# Patient Record
Sex: Male | Born: 1970 | Race: White | Hispanic: No | Marital: Married | Smoking: Never smoker
Health system: Southern US, Community
[De-identification: ages and names within clinical notes are randomized; demographics above are authoritative.]

## PROBLEM LIST (undated history)

## (undated) DIAGNOSIS — Z85830 Personal history of malignant neoplasm of bone: Secondary | ICD-10-CM

## (undated) DIAGNOSIS — K219 Gastro-esophageal reflux disease without esophagitis: Secondary | ICD-10-CM

## (undated) DIAGNOSIS — E119 Type 2 diabetes mellitus without complications: Secondary | ICD-10-CM

## (undated) DIAGNOSIS — F419 Anxiety disorder, unspecified: Secondary | ICD-10-CM

## (undated) DIAGNOSIS — C801 Malignant (primary) neoplasm, unspecified: Secondary | ICD-10-CM

## (undated) DIAGNOSIS — G4730 Sleep apnea, unspecified: Secondary | ICD-10-CM

## (undated) DIAGNOSIS — Z87442 Personal history of urinary calculi: Secondary | ICD-10-CM

## (undated) HISTORY — PX: KIDNEY STONE SURGERY: SHX686

## (undated) HISTORY — DX: Anxiety disorder, unspecified: F41.9

## (undated) HISTORY — DX: Malignant (primary) neoplasm, unspecified: C80.1

## (undated) HISTORY — DX: Personal history of malignant neoplasm of bone: Z85.830

## (undated) HISTORY — DX: Gastro-esophageal reflux disease without esophagitis: K21.9

## (undated) HISTORY — DX: Sleep apnea, unspecified: G47.30

## (undated) HISTORY — PX: OTHER SURGICAL HISTORY: SHX169

---

## 1997-05-04 DIAGNOSIS — C44701 Unspecified malignant neoplasm of skin of unspecified lower limb, including hip: Secondary | ICD-10-CM | POA: Insufficient documentation

## 1998-09-26 DIAGNOSIS — Z85830 Personal history of malignant neoplasm of bone: Secondary | ICD-10-CM

## 1998-09-26 HISTORY — DX: Personal history of malignant neoplasm of bone: Z85.830

## 2004-07-01 ENCOUNTER — Ambulatory Visit: Payer: Self-pay | Admitting: Urology

## 2012-09-27 ENCOUNTER — Emergency Department: Payer: Self-pay | Admitting: Emergency Medicine

## 2012-09-27 LAB — COMPREHENSIVE METABOLIC PANEL
Albumin: 4.3 g/dL (ref 3.4–5.0)
BUN: 12 mg/dL (ref 7–18)
Bilirubin,Total: 0.5 mg/dL (ref 0.2–1.0)
Calcium, Total: 9.2 mg/dL (ref 8.5–10.1)
EGFR (African American): >60
EGFR (Non-African Amer.): >60
Glucose: 99 mg/dL (ref 65–99)
Osmolality: 279 (ref 275–301)
SGPT (ALT): 57 U/L (ref 12–78)

## 2012-09-27 LAB — CBC
HCT: 46.1 % (ref 40.0–52.0)
MCH: 31.8 pg (ref 26.0–34.0)
MCHC: 35.1 g/dL (ref 32.0–36.0)
MCV: 91 fL (ref 80–100)
Platelet: 216 10*3/uL (ref 150–440)
RDW: 12.9 % (ref 11.5–14.5)
WBC: 6.9 10*3/uL (ref 3.8–10.6)

## 2012-09-27 LAB — URINALYSIS, COMPLETE
Bacteria: NONE SEEN
Bilirubin,UR: NEGATIVE
Ketone: NEGATIVE
Leukocyte Esterase: NEGATIVE
Protein: NEGATIVE
RBC,UR: 1 /HPF (ref 0–5)
Specific Gravity: 1.017 (ref 1.003–1.030)
Squamous Epithelial: NONE SEEN

## 2012-09-27 LAB — LIPASE, BLOOD: Lipase: 164 U/L (ref 73–393)

## 2013-08-05 ENCOUNTER — Emergency Department: Payer: Self-pay | Admitting: Emergency Medicine

## 2013-08-05 LAB — URINALYSIS, COMPLETE
Bacteria: NONE SEEN
Bilirubin,UR: NEGATIVE
Glucose,UR: NEGATIVE mg/dL (ref 0–75)
Leukocyte Esterase: NEGATIVE
Nitrite: NEGATIVE
Ph: 6 (ref 4.5–8.0)
RBC,UR: 136 /HPF (ref 0–5)
Specific Gravity: 1.024 (ref 1.003–1.030)
WBC UR: <1 /HPF (ref 0–5)

## 2013-08-05 LAB — CBC
HCT: 44.3 % (ref 40.0–52.0)
HGB: 15.4 g/dL (ref 13.0–18.0)
MCH: 31.2 pg (ref 26.0–34.0)
MCHC: 34.8 g/dL (ref 32.0–36.0)
Platelet: 246 10*3/uL (ref 150–440)

## 2013-08-05 LAB — BASIC METABOLIC PANEL
Anion Gap: 6 — ABNORMAL LOW (ref 7–16)
BUN: 21 mg/dL — ABNORMAL HIGH (ref 7–18)
Calcium, Total: 9.5 mg/dL (ref 8.5–10.1)
Co2: 27 mmol/L (ref 21–32)
Creatinine: 1.11 mg/dL (ref 0.60–1.30)
EGFR (African American): >60
Osmolality: 286 (ref 275–301)
Potassium: 4.1 mmol/L (ref 3.5–5.1)
Sodium: 142 mmol/L (ref 136–145)

## 2014-03-21 LAB — CBC AND DIFFERENTIAL
HCT: 46 % (ref 41–53)
Hemoglobin: 16.3 g/dL (ref 13.5–17.5)
PLATELETS: 274 10*3/uL (ref 150–399)
WBC: 9.2 10*3/mL

## 2014-03-21 LAB — BASIC METABOLIC PANEL
BUN: 15 mg/dL (ref 4–21)
Creatinine: 1.1 mg/dL (ref 0.6–1.3)
Glucose: 95 mg/dL
Potassium: 4.8 mmol/L (ref 3.4–5.3)
Sodium: 143 mmol/L (ref 137–147)

## 2014-03-21 LAB — HEPATIC FUNCTION PANEL
ALT: 31 U/L (ref 10–40)
AST: 20 U/L (ref 14–40)

## 2014-03-21 LAB — TSH: TSH: 2.29 u[IU]/mL (ref 0.41–5.90)

## 2015-05-08 ENCOUNTER — Encounter: Payer: Self-pay | Admitting: Family Medicine

## 2015-05-08 ENCOUNTER — Ambulatory Visit (INDEPENDENT_AMBULATORY_CARE_PROVIDER_SITE_OTHER): Payer: PRIVATE HEALTH INSURANCE | Admitting: Family Medicine

## 2015-05-08 VITALS — BP 120/90 | HR 93 | Temp 98.8°F | Resp 18 | Wt 253.2 lb

## 2015-05-08 DIAGNOSIS — R059 Cough, unspecified: Secondary | ICD-10-CM

## 2015-05-08 DIAGNOSIS — R05 Cough: Secondary | ICD-10-CM

## 2015-05-08 DIAGNOSIS — R42 Dizziness and giddiness: Secondary | ICD-10-CM | POA: Insufficient documentation

## 2015-05-08 DIAGNOSIS — C499 Malignant neoplasm of connective and soft tissue, unspecified: Secondary | ICD-10-CM | POA: Insufficient documentation

## 2015-05-08 DIAGNOSIS — R002 Palpitations: Secondary | ICD-10-CM | POA: Insufficient documentation

## 2015-05-08 DIAGNOSIS — J069 Acute upper respiratory infection, unspecified: Secondary | ICD-10-CM

## 2015-05-08 MED ORDER — PROMETHAZINE-DM 6.25-15 MG/5ML PO SYRP: 5.0000 mL | ORAL_SOLUTION | Freq: Four times a day (QID) | ORAL | Status: DC | PRN

## 2015-05-08 NOTE — Progress Notes (Signed)
Patient: Elijah Mora Male    DOB: 03-14-1971   44 y.o.   MRN: [id number] Visit Date: 05/08/2015  Today's Provider: Vernie Murders, PA   Chief Complaint  Patient presents with  . Cough  . Head and chest congestion   Subjective:    Cough This is a new problem. The current episode started in the past 7 days (on and off). The problem has been gradually worsening. The problem occurs constantly. The cough is non-productive. Associated symptoms include ear congestion, headaches (sometimes), nasal congestion, postnasal drip, rhinorrhea and wheezing (a little). Pertinent negatives include no chest pain, chills, fever, hemoptysis, sore throat, shortness of breath, sweats or weight loss. Nothing aggravates the symptoms. He has tried nothing for the symptoms. His past medical history is significant for environmental allergies.  Patient states one of his co-workers had the same cough two weeks ago and still has the symptoms.  History reviewed. No pertinent past medical history. Patient Active Problem List   Diagnosis Date Noted  . 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 Dizziness 05/08/2015  . 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 No pertinent past surgical history. Family History  Problem Relation Age of Onset  . Cancer Mother 73    Breast  . Diabetes Father   . Hypertension Father      No Known Allergies   Previous Medications   No medications on file   Review of Systems  Constitutional: Positive for fatigue (tired). Negative for fever, chills, weight loss and diaphoresis.  HENT: Positive for congestion (chest and head), postnasal drip, rhinorrhea and sinus pressure. Negative for nosebleeds, sore throat, tinnitus and voice change.   Eyes: Positive for itching.       Watery eyes several times  Respiratory: Positive for cough and wheezing (a little). Negative for hemoptysis and shortness of breath.   Cardiovascular: Positive for palpitations. Negative for chest  pain.  Gastrointestinal: Negative.   Endocrine: Negative.   Genitourinary: Negative.   Musculoskeletal: Negative.   Skin: Negative.   Allergic/Immunologic: Positive for environmental allergies.  Neurological: Positive for dizziness (a little), light-headedness and headaches (sometimes). Negative for syncope and weakness.  Hematological: Negative.   Psychiatric/Behavioral: Negative.    Social History  Substance Use Topics  . Smoking status: Never Smoker   . Smokeless tobacco: Never Used  . Alcohol Use: 2.4 oz/week    4 Cans of beer per week   Objective:   BP 120/90 mmHg  Pulse 93  Temp(Src) 98.8 F (37.1 C) (Oral)  Resp 18  Wt 253 lb 3.2 oz (114.851 kg)  SpO2 97%  Physical Exam  Constitutional: He is oriented to person, place, and time. He appears well-developed and well-nourished. No distress.  HENT:  Head: Normocephalic and atraumatic.  Right Ear: Hearing and external ear normal.  Left Ear: Hearing and external ear normal.  Nose: Nose normal.  Mouth/Throat: Uvula is midline and oropharynx is clear and moist.  Eyes: Conjunctivae, EOM and lids are normal. Pupils are equal, round, and reactive to light. Right eye exhibits no discharge. Left eye exhibits no discharge. No scleral icterus.  Minimal cobblestoning of posterior pharynx without exudates or significant erythema.  Neck: Normal range of motion. Neck supple.  Cardiovascular: Normal rate, regular rhythm and normal heart sounds.   Pulmonary/Chest: Effort normal and breath sounds normal. No respiratory distress.  Musculoskeletal: Normal range of motion.  Neurological: He is alert and oriented to person, place, and time.  Skin: Skin is intact. No  lesion and no rash noted.  Psychiatric: He has a normal mood and affect. His speech is normal and behavior is normal. Thought content normal.      Assessment & Plan:     1. Cough Onset a week ago with some popping in ears and slight light headed sensation. Will treat with  antihistamine and cough syrup. Encouraged to increase fluid intake and recheck if no better in 5-7 days. - promethazine-dextromethorphan (PROMETHAZINE-DM) 6.25-15 MG/5ML syrup; Take 5 mLs by mouth 4 (four) times daily as needed for cough.  Dispense: 118 mL; Refill: 0  2. Upper respiratory infection Recent onset with cough and some rhinorrhea. Denies fever, body aches or chills. May use Tylenol or Advil prn headache. Recheck prn.       Vernie Murders, PA  [facility]

## 2015-09-08 ENCOUNTER — Ambulatory Visit (INDEPENDENT_AMBULATORY_CARE_PROVIDER_SITE_OTHER): Payer: PRIVATE HEALTH INSURANCE | Admitting: Family Medicine

## 2015-09-08 ENCOUNTER — Encounter: Payer: Self-pay | Admitting: Family Medicine

## 2015-09-08 VITALS — BP 132/88 | HR 109 | Temp 98.4°F | Resp 16 | Ht 69.5 in | Wt 258.0 lb

## 2015-09-08 DIAGNOSIS — Z23 Encounter for immunization: Secondary | ICD-10-CM | POA: Diagnosis not present

## 2015-09-08 DIAGNOSIS — F419 Anxiety disorder, unspecified: Secondary | ICD-10-CM

## 2015-09-08 MED ORDER — ESCITALOPRAM OXALATE 10 MG PO TABS: ORAL_TABLET | ORAL | Status: DC

## 2015-09-08 NOTE — Progress Notes (Signed)
Patient: Elijah Mora Male    DOB: Jun 07, 1971   44 y.o.   MRN: [id number] Visit Date: 09/08/2015  Today's Provider: Lelon Huh, MD   Chief Complaint  Patient presents with  . Stress  . Dizziness   Subjective:    Dizziness This is a recurrent problem. Episode onset: dizziness started over 1 year ago. The problem occurs intermittently. Progression since onset: recurrent. Associated symptoms include a change in bowel habit, headaches and neck pain. Pertinent negatives include no abdominal pain, anorexia, arthralgias, chest pain, chills, congestion, coughing, diaphoresis, fatigue, fever, joint swelling, myalgias, nausea, numbness, rash, sore throat, swollen glands, urinary symptoms, vertigo, visual change, vomiting or weakness. The symptoms are aggravated by stress. He has tried nothing (dizziness resolved on it own) for the symptoms.  Patient describes the dizziness as feeling off balance. He was evaluated for this last year with normal CBC, Met C, and had Holter monitor finding only occasional PACs. Symptoms were attributed to stress and anxiety. He started having more work related stress a few months ago and symptoms have since flared back up. He has been working for [employer] for 11 years, but decided to seek employment elsewhere and has accepted a job at [employer] starting January 9th. He feels like he is sometimes on the verge of panic attacks and feels he may benefit from an anti-anxiety medication.      No Known Allergies Previous Medications   IBUPROFEN (ADVIL,MOTRIN) 200 MG TABLET    Take 200 mg by mouth every 6 (six) hours as needed.    Review of Systems  Constitutional: Negative for fever, chills, diaphoresis, appetite change and fatigue.  HENT: Negative for congestion and sore throat.   Respiratory: Negative for cough, chest tightness, shortness of breath and wheezing.   Cardiovascular: Negative for chest pain and palpitations.  Gastrointestinal:  Positive for change in bowel habit. Negative for nausea, vomiting, abdominal pain and anorexia.       Has indigestion  Musculoskeletal: Positive for neck pain. Negative for myalgias, joint swelling and arthralgias.  Skin: Negative for rash.  Neurological: Positive for dizziness and headaches. Negative for vertigo, weakness and numbness.  Psychiatric/Behavioral: The patient is nervous/anxious.     Social History  Substance Use Topics  . Smoking status: Never Smoker   . Smokeless tobacco: Never Used  . Alcohol Use: 2.4 oz/week    4 Cans of beer per week     Comment: occasional use   Objective:   BP 132/88 mmHg  Pulse 109  Temp(Src) 98.4 F (36.9 C) (Oral)  Resp 16  Ht 5' 9.5" (1.765 m)  Wt 258 lb (117.028 kg)  BMI 37.57 kg/m2  SpO2 97%  Physical Exam  General Appearance:    Alert, cooperative, no distress, obese  Eyes:    PERRL, conjunctiva/corneas clear, EOM's intact       Lungs:     Clear to auscultation bilaterally, respirations unlabored  Heart:    Regular rate and rhythm  Neurologic:   Awake, alert, oriented x 3. No apparent focal neurological           defect.           Assessment & Plan:     1. Acute anxiety Work excuse given through January 9th which is when he starts his new job at [employer]. He is to follow up in a month.  Counseled regarding lifestyle modifications to help manage stress including regular exercise and healthier eating habits.  -  escitalopram (LEXAPRO) 10 MG tablet; 1/2 tablet daily for 6 days, then increase to 1 tablet daily  Dispense: 30 tablet; Refill: 1  2. Need for influenza vaccination  - Flu Vaccine QUAD 36+ mos PF IM (Fluarix & Fluzone Quad PF)        Lelon Huh, MD  [facility]

## 2015-09-11 ENCOUNTER — Ambulatory Visit: Payer: PRIVATE HEALTH INSURANCE | Admitting: Family Medicine

## 2015-10-19 ENCOUNTER — Encounter: Payer: Self-pay | Admitting: Family Medicine

## 2015-10-19 ENCOUNTER — Ambulatory Visit (INDEPENDENT_AMBULATORY_CARE_PROVIDER_SITE_OTHER): Payer: Self-pay | Admitting: Family Medicine

## 2015-10-19 VITALS — BP 138/90 | HR 98 | Temp 97.8°F | Resp 16 | Wt 257.0 lb

## 2015-10-19 DIAGNOSIS — M79661 Pain in right lower leg: Secondary | ICD-10-CM

## 2015-10-19 DIAGNOSIS — F419 Anxiety disorder, unspecified: Secondary | ICD-10-CM

## 2015-10-19 NOTE — Progress Notes (Signed)
Patient: Elijah Mora Male    DOB: 13-Aug-1971   45 y.o.   MRN: [id number] Visit Date: 10/19/2015  Today's Provider: Lelon Huh, MD   Chief Complaint  Patient presents with  . Leg Pain   Subjective:    Leg Pain  Incident onset: 2 months ago. Pain location: right lower leg. The pain is moderate. The pain has been intermittent since onset. Associated symptoms include numbness (right foot) and tingling (right foot). Pertinent negatives include no inability to bear weight, loss of motion or muscle weakness. Exacerbated by: physical activity.  Patient states he started working out 07/2015 doing squats and walking. Patient started to develop pain in his right lower leg shortly after working out. Patient thought this was normal. Patient states the pain in his leg has not improved since starting his workout routine. Patient has aches in his calf muscle and pain in his shine. Patient has tried resting which seems to help the pain go away. Pain is in                             termittent, depending on how much activity he is doing. Ibuprofen helps briefly.         He is concerned since he has history of osteosarcoma excised brom right calf years ago.   Follow up panic attacks He states that lexapro has been very effecting at preventing panicky feelings, and that he is tolerating it very well.                                               No Known Allergies Previous Medications   ESCITALOPRAM (LEXAPRO) 10 MG TABLET    1/2 tablet daily for 6 days, then increase to 1 tablet daily   IBUPROFEN (ADVIL,MOTRIN) 200 MG TABLET    Take 200 mg by mouth every 6 (six) hours as needed.    Past Medical History  Diagnosis Date  . 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 Negative for fever, chills and appetite change.  Respiratory: Negative for chest tightness, shortness of breath and wheezing.   Cardiovascular:  Positive for leg swelling. Negative for chest pain and palpitations.  Gastrointestinal: Negative for nausea, vomiting and abdominal pain.  Musculoskeletal: Positive for myalgias.  Neurological: Positive for tingling (right foot) and numbness (right foot).    Social History  Substance Use Topics  . Smoking status: Never Smoker   . Smokeless tobacco: Never Used  . Alcohol Use: 2.4 oz/week    4 Cans of beer per week     Comment: occasional use   Objective:   BP 138/90 mmHg  Pulse 98  Temp(Src) 97.8 F (36.6 C) (Oral)  Resp 16  Wt 257 lb (116.574 kg)  SpO2 97%  Physical Exam   General Appearance:    Alert, cooperative, no distress  Eyes:    PERRL, conjunctiva/corneas clear, EOM's intact       Lungs:     Clear to auscultation bilaterally, respirations unlabored  Heart:    Regular rate and rhythm  Neurologic:   Awake, alert, oriented x 3. No apparent focal neurological           defect.   Extr:  Some tender varicosities right posterior lower leg. Tender along anterior tibialis of right leg. No pain with ankle flexion or extension. No masses        Assessment & Plan:     1. Right calf pain May due to varicose veins. Will check ultrasound - Ultrasound doppler arterial leg right; Future  2. Pain in right shin Likely shin splints. Consider scheduled NSAIDs and frequent icing if Xrays normal - DG Tibia/Fibula Right; Future  3. Acute anxiety Greatly improved on Lexapro. Reminded not to discontinue with notifying MD.        Lelon Huh, MD  [facility]

## 2015-10-20 ENCOUNTER — Encounter: Payer: Self-pay | Admitting: Family Medicine

## 2015-10-21 ENCOUNTER — Other Ambulatory Visit: Payer: Self-pay | Admitting: Family Medicine

## 2015-10-21 ENCOUNTER — Other Ambulatory Visit: Payer: Self-pay

## 2015-10-21 DIAGNOSIS — M79661 Pain in right lower leg: Secondary | ICD-10-CM

## 2015-10-21 DIAGNOSIS — M79604 Pain in right leg: Secondary | ICD-10-CM

## 2015-10-21 DIAGNOSIS — M7989 Other specified soft tissue disorders: Principal | ICD-10-CM

## 2015-10-23 ENCOUNTER — Ambulatory Visit: Admission: RE | Admit: 2015-10-23 | Payer: No Typology Code available for payment source | Source: Ambulatory Visit

## 2015-10-23 ENCOUNTER — Telehealth: Payer: Self-pay | Admitting: Family Medicine

## 2015-10-23 MED ORDER — MELOXICAM 15 MG PO TABS: 15.0000 mg | ORAL_TABLET | Freq: Every day | ORAL | Status: DC

## 2015-10-23 NOTE — Telephone Encounter (Signed)
That's fine. Just let me know if doesn't keep improving and we can re-order ultrasound

## 2015-10-23 NOTE — Telephone Encounter (Signed)
Are you going to call him in a different NSAID?

## 2015-10-23 NOTE — Telephone Encounter (Signed)
Spoke with pt. He reports that he has been taking ibuprofen regularly and has noticed his leg improving. He says that he will have to pay close to 500$ out of pocket for this ultrasound and would like to know if he could go with the option that you discussed with him about a long acting anti inflammatory for now instead of the ultrasound since his leg is getting better. Please advise.  [facility]  CB# [phone]

## 2015-10-23 NOTE — Telephone Encounter (Signed)
Pt informed

## 2015-10-23 NOTE — Telephone Encounter (Signed)
[facility]

## 2015-10-23 NOTE — Telephone Encounter (Signed)
Pt stated that he was scheduled for an ultra sound and he is going to have to reschedule it but he would like to speak with a nurse or Dr. Caryn Section before he does. Thanks TNP

## 2015-11-09 ENCOUNTER — Ambulatory Visit
Admission: RE | Admit: 2015-11-09 | Discharge: 2015-11-09 | Disposition: A | Discharge status: Home / Self Care | Payer: BLUE CROSS/BLUE SHIELD | Source: Ambulatory Visit | Attending: Family Medicine | Admitting: Family Medicine

## 2015-11-09 ENCOUNTER — Encounter: Payer: Self-pay | Admitting: Family Medicine

## 2015-11-09 ENCOUNTER — Ambulatory Visit (INDEPENDENT_AMBULATORY_CARE_PROVIDER_SITE_OTHER): Payer: BLUE CROSS/BLUE SHIELD | Admitting: Family Medicine

## 2015-11-09 ENCOUNTER — Other Ambulatory Visit: Payer: Self-pay | Admitting: Family Medicine

## 2015-11-09 VITALS — BP 130/80 | HR 117 | Temp 98.5°F | Resp 16 | Ht 69.5 in | Wt 256.0 lb

## 2015-11-09 DIAGNOSIS — M79604 Pain in right leg: Secondary | ICD-10-CM | POA: Diagnosis not present

## 2015-11-09 DIAGNOSIS — M79661 Pain in right lower leg: Secondary | ICD-10-CM | POA: Insufficient documentation

## 2015-11-09 DIAGNOSIS — M79601 Pain in right arm: Secondary | ICD-10-CM

## 2015-11-09 NOTE — Progress Notes (Signed)
       Patient: Elijah Mora Male    DOB: September 02, 1971   45 y.o.   MRN: [id number] Visit Date: 11/09/2015  Today's Provider: Lelon Huh, MD   Chief Complaint  Patient presents with  . Follow-up  . Leg Pain  . Anxiety   Subjective:    Leg Pain  The incident occurred more than 1 week ago (3 months). The incident occurred at the gym. The injury mechanism is unknown. The pain is present in the right leg and right ankle (right ankle, right calf). The quality of the pain is described as burning. The pain is at a severity of 8/10. The pain is moderate. The pain has been intermittent since onset. Associated symptoms include a loss of sensation, numbness and tingling. Pertinent negatives include no inability to bear weight, loss of motion or muscle weakness. Associated symptoms comments: Right foot and toes. He reports no foreign bodies present. The symptoms are aggravated by movement and palpation (laying flat on his back.). He has tried NSAIDs for the symptoms. The treatment provided no relief.    Follow-up for leg pain from 10/19/2015; [place] and Xrays ordered, but none done due to expense. Started on meloxicam but tates there was no improvement while taking this.  Started having burning across top of right foot about 3-4 days ago. Keeping up a t night.   No Known Allergies Previous Medications   ESCITALOPRAM (LEXAPRO) 10 MG TABLET    Take 1 tablet (10 mg total) by mouth daily.   IBUPROFEN (ADVIL,MOTRIN) 200 MG TABLET    Take 200 mg by mouth every 6 (six) hours as needed.   MELOXICAM (MOBIC) 15 MG TABLET    Take 1 tablet (15 mg total) by mouth daily. Take with food    Review of Systems  Constitutional: Negative for fever, chills and appetite change.  Respiratory: Negative for chest tightness, shortness of breath and wheezing.   Cardiovascular: Positive for leg swelling. Negative for chest pain and palpitations.  Gastrointestinal: Negative for nausea, vomiting and abdominal pain.    Musculoskeletal: Positive for joint swelling.       Right calf to ankle  Neurological: Positive for tingling and numbness.    Social History  Substance Use Topics  . Smoking status: Never Smoker   . Smokeless tobacco: Never Used  . Alcohol Use: 2.4 oz/week    4 Cans of beer per week     Comment: occasional use   Objective:   BP 130/80 mmHg  Pulse 117  Temp(Src) 98.5 F (36.9 C) (Oral)  Resp 16  Ht 5' 9.5" (1.765 m)  Wt 256 lb (116.121 kg)  BMI 37.28 kg/m2  SpO2 98%  Physical Exam   General Appearance:    Alert, cooperative, no distress  MS:   Some tendenessr varicosities right posterior lower leg. Tender along anterior tibialis of right leg. No pain with ankle flexion or extension. No masses  Hyperasthesia noted dorsum of right forefoot. .        Assessment & Plan:     1. Leg pain, anterior, right Now with burning dorsum of foot and not improving with scheduled NSAID - DG Tibia/Fibula Right; Future  2. Right calf pain Consider re-ordering ultrasound after reviewing xray.         Lelon Huh, MD  [facility]

## 2015-11-09 NOTE — Patient Instructions (Signed)
Go to the [facility] on [address] for right leg Xray  

## 2015-11-10 ENCOUNTER — Telehealth: Payer: Self-pay | Admitting: Family Medicine

## 2015-11-10 NOTE — Telephone Encounter (Signed)
Please see result note.  Thanks.

## 2015-11-10 NOTE — Telephone Encounter (Signed)
Please advise results? 

## 2015-11-10 NOTE — Telephone Encounter (Signed)
Pt called to get the X ray results from 11/09/15. Thanks TNP

## 2015-11-10 NOTE — Telephone Encounter (Signed)
Advised patient

## 2015-11-16 ENCOUNTER — Ambulatory Visit
Admission: RE | Admit: 2015-11-16 | Discharge: 2015-11-16 | Disposition: A | Discharge status: Home / Self Care | Payer: BLUE CROSS/BLUE SHIELD | Source: Ambulatory Visit | Attending: Family Medicine | Admitting: Family Medicine

## 2015-11-16 DIAGNOSIS — Z9889 Other specified postprocedural states: Secondary | ICD-10-CM | POA: Insufficient documentation

## 2015-11-16 DIAGNOSIS — M79601 Pain in right arm: Secondary | ICD-10-CM | POA: Diagnosis not present

## 2015-11-16 DIAGNOSIS — M79604 Pain in right leg: Secondary | ICD-10-CM | POA: Insufficient documentation

## 2015-11-18 ENCOUNTER — Telehealth: Payer: Self-pay | Admitting: *Deleted

## 2015-11-18 DIAGNOSIS — M79661 Pain in right lower leg: Secondary | ICD-10-CM

## 2015-11-18 NOTE — Telephone Encounter (Signed)
Patient advised as directed below. Patient states the pain is still there, and would like to proceed with a MRI. Patient requested a appointment at [facility] [place] due to insurance.

## 2015-11-18 NOTE — Telephone Encounter (Signed)
Please order MRI right lower leg as below at [facility] per patient request.

## 2015-11-18 NOTE — Telephone Encounter (Signed)
Patient is requesting US results.  

## 2015-11-18 NOTE — Telephone Encounter (Signed)
-----   Message from Birdie Sons, MD sent at 11/18/2015  3:30 PM EST ----- No blood clots on ultrasound. No soft tissue abnormalities were seen. Radiologist recommend  MRI for further evaluation if still having any pain in order to rule out sarcoma recurrence.

## 2015-11-18 NOTE — Telephone Encounter (Signed)
LMTCB

## 2015-11-30 ENCOUNTER — Ambulatory Visit
Admission: RE | Admit: 2015-11-30 | Discharge: 2015-11-30 | Disposition: A | Discharge status: Home / Self Care | Payer: BLUE CROSS/BLUE SHIELD | Source: Ambulatory Visit | Attending: Family Medicine | Admitting: Family Medicine

## 2015-11-30 DIAGNOSIS — M79661 Pain in right lower leg: Secondary | ICD-10-CM

## 2015-11-30 MED ORDER — GADOBENATE DIMEGLUMINE 529 MG/ML IV SOLN
20.0000 mL | Freq: Once | INTRAVENOUS | Status: AC | PRN
  Administered 2015-11-30: 20 mL via INTRAVENOUS

## 2015-12-31 ENCOUNTER — Encounter: Payer: Self-pay | Admitting: Physician Assistant

## 2015-12-31 ENCOUNTER — Ambulatory Visit (INDEPENDENT_AMBULATORY_CARE_PROVIDER_SITE_OTHER): Payer: BLUE CROSS/BLUE SHIELD | Admitting: Physician Assistant

## 2015-12-31 ENCOUNTER — Telehealth: Payer: Self-pay | Admitting: Family Medicine

## 2015-12-31 VITALS — BP 160/90 | HR 112 | Temp 98.2°F | Resp 16 | Wt 260.0 lb

## 2015-12-31 DIAGNOSIS — M5441 Lumbago with sciatica, right side: Secondary | ICD-10-CM | POA: Diagnosis not present

## 2015-12-31 MED ORDER — IBUPROFEN 800 MG PO TABS: 800.0000 mg | ORAL_TABLET | Freq: Three times a day (TID) | ORAL | Status: AC | PRN

## 2015-12-31 MED ORDER — METAXALONE 800 MG PO TABS: 800.0000 mg | ORAL_TABLET | Freq: Three times a day (TID) | ORAL | Status: DC

## 2015-12-31 NOTE — Progress Notes (Signed)
Patient: Elijah Mora Male    DOB: 20-Nov-1970   44 y.o.   MRN: [id number] Visit Date: 12/31/2015  Today's Provider: Mar Daring, PA-C   Chief Complaint  Patient presents with  . Back Pain   Subjective:    Back Pain This is a new problem. The current episode started in the past 7 days (for the past three days). The problem occurs constantly. The problem has been gradually worsening since onset. The pain is present in the lumbar spine (Lower back right side). The quality of the pain is described as cramping and aching. The pain radiates to the right thigh, right foot and right knee. The pain is at a severity of 10/10 (when he sits down). The pain is severe (no injury to his back ). The symptoms are aggravated by sitting and bending. Associated symptoms include leg pain (right leg all the way down to his heel) and tingling (both feet). Pertinent negatives include no abdominal pain, bladder incontinence, bowel incontinence, fever, headaches or numbness. Treatments tried: Ibupron at 7 am this morning. The treatment provided no relief.  He has been having pain in his right calf with swelling and was put in a cam walker boot by his orthopedic doctor ([facility]). He states he was to wear the boot for 4 weeks but stopped wearing it at 3.5 weeks due to the increasing back pain.     No Known Allergies Previous Medications   ESCITALOPRAM (LEXAPRO) 10 MG TABLET    Take 1 tablet (10 mg total) by mouth daily.   IBUPROFEN (ADVIL,MOTRIN) 200 MG TABLET    Take 200 mg by mouth every 6 (six) hours as needed.   MELOXICAM (MOBIC) 15 MG TABLET    Take 1 tablet (15 mg total) by mouth daily. Take with food    Review of Systems  Constitutional: Negative for fever.  Respiratory: Negative.   Cardiovascular: Negative.   Gastrointestinal: Negative.  Negative for abdominal pain and bowel incontinence.  Genitourinary: Negative for bladder incontinence.  Musculoskeletal: Positive for back pain and  gait problem (antalgic). Negative for myalgias and joint swelling.  Neurological: Positive for tingling (both feet). Negative for numbness and headaches.    Social History  Substance Use Topics  . Smoking status: Never Smoker   . Smokeless tobacco: Never Used  . Alcohol Use: 2.4 oz/week    4 Cans of beer per week     Comment: occasional use   Objective:   BP 160/90 mmHg  Pulse 112  Temp(Src) 98.2 F (36.8 C) (Oral)  Resp 16  Wt 260 lb (117.935 kg)  Physical Exam  Constitutional: He appears well-developed and well-nourished. No distress.  HENT:  Head: Normocephalic and atraumatic.  Neck: Normal range of motion. Neck supple.  Cardiovascular: Normal rate, regular rhythm and normal heart sounds.  Exam reveals no gallop and no friction rub.   No murmur heard. Pulmonary/Chest: Effort normal and breath sounds normal. No respiratory distress. He has no wheezes. He has no rales.  Musculoskeletal:       Lumbar back: He exhibits decreased range of motion and tenderness (right paraspinal and gluteus max just below SI joint). He exhibits no bony tenderness.  Reports numbness down right leg to heel + SLR right  Skin: He is not diaphoretic.  Vitals reviewed.       Assessment & Plan:     1. Right-sided low back pain with right-sided sciatica It is possible he has muscle strain with  right sided sciatica but due to history of synovial sarcoma and recent use of cam boot will also get lumbar spine xray to make sure there are no bony abnormalities. Will treat with NSAIDs and muscle relaxer as below. I did also give him back exercises to start trying to work in to. Advised to use heat and gentle stretching. May benefit from warm water with epsom salt soak. If xray is normal will have him call the office for follow up if symptoms do not improve or worsen. - DG Lumbar Spine Complete; Future - ibuprofen (ADVIL,MOTRIN) 800 MG tablet; Take 1 tablet (800 mg total) by mouth every 8 (eight) hours as  needed.  Dispense: 90 tablet; Refill: 0 - metaxalone (SKELAXIN) 800 MG tablet; Take 1 tablet (800 mg total) by mouth 3 (three) times daily.  Dispense: 30 tablet; Refill: 0       Mar Daring, PA-C  [facility]

## 2015-12-31 NOTE — Telephone Encounter (Signed)
Pt was in earlier this am and was given medication for pain .  He thinks he is going to have to have something stronger for pain.  He took the ibupropfin and skelaxin and it is not helping.  His call back is [phone]  Thank steri

## 2015-12-31 NOTE — Patient Instructions (Signed)
Sciatica Sciatica is pain, weakness, numbness, or tingling along the path of the sciatic nerve. The nerve starts in the lower back and runs down the back of each leg. The nerve controls the muscles in the lower leg and in the back of the knee, while also providing sensation to the back of the thigh, lower leg, and the sole of your foot. Sciatica is a symptom of another medical condition. For instance, nerve damage or certain conditions, such as a herniated disk or bone spur on the spine, pinch or put pressure on the sciatic nerve. This causes the pain, weakness, or other sensations normally associated with sciatica. Generally, sciatica only affects one side of the body. CAUSES  1. Herniated or slipped disc. 2. Degenerative disk disease. 3. A pain disorder involving the narrow muscle in the buttocks (piriformis syndrome). 4. Pelvic injury or fracture. 5. Pregnancy. 6. Tumor (rare). SYMPTOMS  Symptoms can vary from mild to very severe. The symptoms usually travel from the low back to the buttocks and down the back of the leg. Symptoms can include: 1. Mild tingling or dull aches in the lower back, leg, or hip. 2. Numbness in the back of the calf or sole of the foot. 3. Burning sensations in the lower back, leg, or hip. 4. Sharp pains in the lower back, leg, or hip. 5. Leg weakness. 6. Severe back pain inhibiting movement. These symptoms may get worse with coughing, sneezing, laughing, or prolonged sitting or standing. Also, being overweight may worsen symptoms. DIAGNOSIS  Your caregiver will perform a physical exam to look for common symptoms of sciatica. He or she may ask you to do certain movements or activities that would trigger sciatic nerve pain. Other tests may be performed to find the cause of the sciatica. These may include: 1. Blood tests. 2. X-rays. 3. Imaging tests, such as an MRI or CT scan. TREATMENT  Treatment is directed at the cause of the sciatic pain. Sometimes, treatment is  not necessary and the pain and discomfort goes away on its own. If treatment is needed, your caregiver may suggest: 1. Over-the-counter medicines to relieve pain. 2. Prescription medicines, such as anti-inflammatory medicine, muscle relaxants, or narcotics. 3. Applying heat or ice to the painful area. 4. Steroid injections to lessen pain, irritation, and inflammation around the nerve. 5. Reducing activity during periods of pain. 6. Exercising and stretching to strengthen your abdomen and improve flexibility of your spine. Your caregiver may suggest losing weight if the extra weight makes the back pain worse. 7. Physical therapy. 8. Surgery to eliminate what is pressing or pinching the nerve, such as a bone spur or part of a herniated disk. HOME CARE INSTRUCTIONS  1. Only take over-the-counter or prescription medicines for pain or discomfort as directed by your caregiver. 2. Apply ice to the affected area for 20 minutes, 3-4 times a day for the first 48-72 hours. Then try heat in the same way. 3. Exercise, stretch, or perform your usual activities if these do not aggravate your pain. 4. Attend physical therapy sessions as directed by your caregiver. 5. Keep all follow-up appointments as directed by your caregiver. 6. Do not wear high heels or shoes that do not provide proper support. 7. Check your mattress to see if it is too soft. A firm mattress may lessen your pain and discomfort. SEEK IMMEDIATE MEDICAL CARE IF:  1. You lose control of your bowel or bladder (incontinence). 2. You have increasing weakness in the lower back, pelvis, buttocks,  or legs. 3. You have redness or swelling of your back. 4. You have a burning sensation when you urinate. 5. You have pain that gets worse when you lie down or awakens you at night. 6. Your pain is worse than you have experienced in the past. 7. Your pain is lasting longer than 4 weeks. 8. You are suddenly losing weight without reason. MAKE SURE  YOU: 1. Understand these instructions. 2. Will watch your condition. 3. Will get help right away if you are not doing well or get worse.   This information is not intended to replace advice given to you by your health care provider. Make sure you discuss any questions you have with your health care provider.   Document Released: 09/06/2001 Document Revised: 06/03/2015 Document Reviewed: 01/22/2012 Elsevier Interactive Patient Education 2016 [employer]  Back Exercises The following exercises strengthen the muscles that help to support the back. They also help to keep the lower back flexible. Doing these exercises can help to prevent back pain or lessen existing pain. If you have back pain or discomfort, try doing these exercises 2-3 times each day or as told by your health care provider. When the pain goes away, do them once each day, but increase the number of times that you repeat the steps for each exercise (do more repetitions). If you do not have back pain or discomfort, do these exercises once each day or as told by your health care provider. EXERCISES Single Knee to Chest Repeat these steps 3-5 times for each leg: 7. Lie on your back on a firm bed or the floor with your legs extended. 8. Bring one knee to your chest. Your other leg should stay extended and in contact with the floor. 9. Hold your knee in place by grabbing your knee or thigh. 10. Pull on your knee until you feel a gentle stretch in your lower back. 11. Hold the stretch for 10-30 seconds. 12. Slowly release and straighten your leg. Pelvic Tilt Repeat these steps 5-10 times: 7. Lie on your back on a firm bed or the floor with your legs extended. 8. Bend your knees so they are pointing toward the ceiling and your feet are flat on the floor. 9. Tighten your lower abdominal muscles to press your lower back against the floor. This motion will tilt your pelvis so your tailbone points up toward the ceiling instead of  pointing to your feet or the floor. 10. With gentle tension and even breathing, hold this position for 5-10 seconds. Cat-Cow Repeat these steps until your lower back becomes more flexible: 4. Get into a hands-and-knees position on a firm surface. Keep your hands under your shoulders, and keep your knees under your hips. You may place padding under your knees for comfort. 5. Let your head hang down, and point your tailbone toward the floor so your lower back becomes rounded like the back of a cat. 6. Hold this position for 5 seconds. 7. Slowly lift your head and point your tailbone up toward the ceiling so your back forms a sagging arch like the back of a cow. 8. Hold this position for 5 seconds. Press-Ups Repeat these steps 5-10 times: 9. Lie on your abdomen (face-down) on the floor. 10. Place your palms near your head, about shoulder-width apart. 11. While you keep your back as relaxed as possible and keep your hips on the floor, slowly straighten your arms to raise the top half of your body and lift your shoulders.  Do not use your back muscles to raise your upper torso. You may adjust the placement of your hands to make yourself more comfortable. 12. Hold this position for 5 seconds while you keep your back relaxed. 13. Slowly return to lying flat on the floor. Bridges Repeat these steps 10 times: 8. Lie on your back on a firm surface. 9. Bend your knees so they are pointing toward the ceiling and your feet are flat on the floor. 10. Tighten your buttocks muscles and lift your buttocks off of the floor until your waist is at almost the same height as your knees. You should feel the muscles working in your buttocks and the back of your thighs. If you do not feel these muscles, slide your feet 1-2 inches farther away from your buttocks. 11. Hold this position for 3-5 seconds. 12. Slowly lower your hips to the starting position, and allow your buttocks muscles to relax completely. If this  exercise is too easy, try doing it with your arms crossed over your chest. Abdominal Crunches Repeat these steps 5-10 times: 9. Lie on your back on a firm bed or the floor with your legs extended. [place] your knees so they are pointing toward the ceiling and your feet are flat on the floor. 11. Cross your arms over your chest. 12. Tip your chin slightly toward your chest without bending your neck. 78. Tighten your abdominal muscles and slowly raise your trunk (torso) high enough to lift your shoulder blades a tiny bit off of the floor. Avoid raising your torso higher than that, because it can put too much stress on your low back and it does not help to strengthen your abdominal muscles. 14. Slowly return to your starting position. Back Lifts Repeat these steps 5-10 times: 4. Lie on your abdomen (face-down) with your arms at your sides, and rest your forehead on the floor. 5. Tighten the muscles in your legs and your buttocks. 6. Slowly lift your chest off of the floor while you keep your hips pressed to the floor. Keep the back of your head in line with the curve in your back. Your eyes should be looking at the floor. 7. Hold this position for 3-5 seconds. 8. Slowly return to your starting position. SEEK MEDICAL CARE IF:  Your back pain or discomfort gets much worse when you do an exercise.  Your back pain or discomfort does not lessen within 2 hours after you exercise. If you have any of these problems, stop doing these exercises right away. Do not do them again unless your health care provider says that you can. SEEK IMMEDIATE MEDICAL CARE IF:  You develop sudden, severe back pain. If this happens, stop doing the exercises right away. Do not do them again unless your health care provider says that you can.   This information is not intended to replace advice given to you by your health care provider. Make sure you discuss any questions you have with your health care provider.    Document Released: 10/20/2004 Document Revised: 06/03/2015 Document Reviewed: 11/06/2014 Elsevier Interactive Patient Education [employer].

## 2016-01-01 MED ORDER — ETODOLAC 500 MG PO TABS
500.0000 mg | ORAL_TABLET | Freq: Two times a day (BID) | ORAL | Status: DC
Start: 2016-01-01 — End: 2016-06-28

## 2016-01-01 NOTE — Telephone Encounter (Signed)
Advised  ED 

## 2016-01-01 NOTE — Telephone Encounter (Signed)
Sent in Etodolac to [facility] to see if this offers any better benefit. If still no relief will do oral prednisone.

## 2016-01-03 DIAGNOSIS — T82898A Other specified complication of vascular prosthetic devices, implants and grafts, initial encounter: Secondary | ICD-10-CM | POA: Insufficient documentation

## 2016-01-04 ENCOUNTER — Ambulatory Visit
Admission: RE | Admit: 2016-01-04 | Discharge: 2016-01-04 | Disposition: A | Discharge status: Home / Self Care | Payer: BLUE CROSS/BLUE SHIELD | Source: Ambulatory Visit | Attending: Physician Assistant | Admitting: Physician Assistant

## 2016-01-04 DIAGNOSIS — M549 Dorsalgia, unspecified: Secondary | ICD-10-CM | POA: Diagnosis not present

## 2016-01-04 DIAGNOSIS — M5441 Lumbago with sciatica, right side: Secondary | ICD-10-CM | POA: Diagnosis not present

## 2016-01-13 DIAGNOSIS — M79661 Pain in right lower leg: Secondary | ICD-10-CM | POA: Diagnosis not present

## 2016-01-13 DIAGNOSIS — Z85831 Personal history of malignant neoplasm of soft tissue: Secondary | ICD-10-CM | POA: Diagnosis not present

## 2016-03-07 DIAGNOSIS — M79661 Pain in right lower leg: Secondary | ICD-10-CM | POA: Diagnosis not present

## 2016-03-07 DIAGNOSIS — Z85831 Personal history of malignant neoplasm of soft tissue: Secondary | ICD-10-CM | POA: Diagnosis not present

## 2016-04-06 DIAGNOSIS — I776 Arteritis, unspecified: Secondary | ICD-10-CM | POA: Diagnosis not present

## 2016-04-12 DIAGNOSIS — I776 Arteritis, unspecified: Secondary | ICD-10-CM | POA: Diagnosis not present

## 2016-04-18 DIAGNOSIS — F419 Anxiety disorder, unspecified: Secondary | ICD-10-CM | POA: Diagnosis not present

## 2016-04-18 DIAGNOSIS — Z79899 Other long term (current) drug therapy: Secondary | ICD-10-CM | POA: Diagnosis not present

## 2016-04-18 DIAGNOSIS — I739 Peripheral vascular disease, unspecified: Secondary | ICD-10-CM | POA: Diagnosis not present

## 2016-04-18 DIAGNOSIS — I776 Arteritis, unspecified: Secondary | ICD-10-CM | POA: Diagnosis not present

## 2016-04-18 DIAGNOSIS — Z923 Personal history of irradiation: Secondary | ICD-10-CM | POA: Diagnosis not present

## 2016-04-18 DIAGNOSIS — Z85831 Personal history of malignant neoplasm of soft tissue: Secondary | ICD-10-CM | POA: Diagnosis not present

## 2016-04-18 HISTORY — PX: FEMORAL BYPASS: SHX50

## 2016-04-20 DIAGNOSIS — Z01810 Encounter for preprocedural cardiovascular examination: Secondary | ICD-10-CM | POA: Diagnosis not present

## 2016-04-20 DIAGNOSIS — I739 Peripheral vascular disease, unspecified: Secondary | ICD-10-CM | POA: Diagnosis not present

## 2016-04-21 DIAGNOSIS — I739 Peripheral vascular disease, unspecified: Secondary | ICD-10-CM | POA: Diagnosis not present

## 2016-04-21 DIAGNOSIS — Z85831 Personal history of malignant neoplasm of soft tissue: Secondary | ICD-10-CM | POA: Diagnosis not present

## 2016-04-21 DIAGNOSIS — F419 Anxiety disorder, unspecified: Secondary | ICD-10-CM | POA: Diagnosis not present

## 2016-04-21 DIAGNOSIS — Z923 Personal history of irradiation: Secondary | ICD-10-CM | POA: Diagnosis not present

## 2016-04-21 DIAGNOSIS — Z79899 Other long term (current) drug therapy: Secondary | ICD-10-CM | POA: Diagnosis not present

## 2016-04-21 DIAGNOSIS — I776 Arteritis, unspecified: Secondary | ICD-10-CM | POA: Diagnosis not present

## 2016-04-21 DIAGNOSIS — Z6839 Body mass index (BMI) 39.0-39.9, adult: Secondary | ICD-10-CM | POA: Diagnosis not present

## 2016-04-21 DIAGNOSIS — E669 Obesity, unspecified: Secondary | ICD-10-CM | POA: Diagnosis not present

## 2016-04-21 DIAGNOSIS — K219 Gastro-esophageal reflux disease without esophagitis: Secondary | ICD-10-CM | POA: Diagnosis not present

## 2016-04-21 DIAGNOSIS — T451X5A Adverse effect of antineoplastic and immunosuppressive drugs, initial encounter: Secondary | ICD-10-CM | POA: Diagnosis not present

## 2016-05-08 ENCOUNTER — Other Ambulatory Visit: Payer: Self-pay | Admitting: Family Medicine

## 2016-05-08 DIAGNOSIS — F419 Anxiety disorder, unspecified: Secondary | ICD-10-CM

## 2016-05-17 DIAGNOSIS — I776 Arteritis, unspecified: Secondary | ICD-10-CM | POA: Diagnosis not present

## 2016-05-17 DIAGNOSIS — I739 Peripheral vascular disease, unspecified: Secondary | ICD-10-CM | POA: Diagnosis not present

## 2016-05-17 DIAGNOSIS — Z48812 Encounter for surgical aftercare following surgery on the circulatory system: Secondary | ICD-10-CM | POA: Diagnosis not present

## 2016-05-20 DIAGNOSIS — Z79899 Other long term (current) drug therapy: Secondary | ICD-10-CM | POA: Diagnosis not present

## 2016-05-20 DIAGNOSIS — I739 Peripheral vascular disease, unspecified: Secondary | ICD-10-CM | POA: Diagnosis not present

## 2016-05-20 DIAGNOSIS — Z95828 Presence of other vascular implants and grafts: Secondary | ICD-10-CM | POA: Diagnosis not present

## 2016-05-20 DIAGNOSIS — T814XXA Infection following a procedure, initial encounter: Secondary | ICD-10-CM | POA: Diagnosis not present

## 2016-05-20 DIAGNOSIS — L089 Local infection of the skin and subcutaneous tissue, unspecified: Secondary | ICD-10-CM | POA: Diagnosis not present

## 2016-05-20 DIAGNOSIS — I776 Arteritis, unspecified: Secondary | ICD-10-CM | POA: Diagnosis not present

## 2016-05-20 DIAGNOSIS — E669 Obesity, unspecified: Secondary | ICD-10-CM | POA: Diagnosis not present

## 2016-05-20 DIAGNOSIS — I96 Gangrene, not elsewhere classified: Secondary | ICD-10-CM | POA: Diagnosis not present

## 2016-05-20 DIAGNOSIS — Z7982 Long term (current) use of aspirin: Secondary | ICD-10-CM | POA: Diagnosis not present

## 2016-05-20 DIAGNOSIS — T8189XA Other complications of procedures, not elsewhere classified, initial encounter: Secondary | ICD-10-CM | POA: Diagnosis not present

## 2016-05-20 DIAGNOSIS — T8131XA Disruption of external operation (surgical) wound, not elsewhere classified, initial encounter: Secondary | ICD-10-CM | POA: Diagnosis not present

## 2016-05-20 DIAGNOSIS — Z452 Encounter for adjustment and management of vascular access device: Secondary | ICD-10-CM | POA: Diagnosis not present

## 2016-05-20 DIAGNOSIS — J45909 Unspecified asthma, uncomplicated: Secondary | ICD-10-CM | POA: Diagnosis not present

## 2016-05-20 DIAGNOSIS — B9561 Methicillin susceptible Staphylococcus aureus infection as the cause of diseases classified elsewhere: Secondary | ICD-10-CM | POA: Diagnosis not present

## 2016-05-20 DIAGNOSIS — Z6838 Body mass index (BMI) 38.0-38.9, adult: Secondary | ICD-10-CM | POA: Diagnosis not present

## 2016-05-20 DIAGNOSIS — K219 Gastro-esophageal reflux disease without esophagitis: Secondary | ICD-10-CM | POA: Diagnosis not present

## 2016-05-20 DIAGNOSIS — Z85831 Personal history of malignant neoplasm of soft tissue: Secondary | ICD-10-CM | POA: Diagnosis not present

## 2016-05-20 DIAGNOSIS — Z923 Personal history of irradiation: Secondary | ICD-10-CM | POA: Diagnosis not present

## 2016-05-24 DIAGNOSIS — I776 Arteritis, unspecified: Secondary | ICD-10-CM | POA: Diagnosis not present

## 2016-05-24 DIAGNOSIS — T814XXA Infection following a procedure, initial encounter: Secondary | ICD-10-CM | POA: Diagnosis not present

## 2016-05-24 DIAGNOSIS — Z452 Encounter for adjustment and management of vascular access device: Secondary | ICD-10-CM | POA: Diagnosis not present

## 2016-05-27 DIAGNOSIS — I776 Arteritis, unspecified: Secondary | ICD-10-CM | POA: Diagnosis not present

## 2016-05-27 DIAGNOSIS — T8189XA Other complications of procedures, not elsewhere classified, initial encounter: Secondary | ICD-10-CM | POA: Diagnosis not present

## 2016-05-27 DIAGNOSIS — T814XXA Infection following a procedure, initial encounter: Secondary | ICD-10-CM | POA: Diagnosis not present

## 2016-06-01 DIAGNOSIS — M25661 Stiffness of right knee, not elsewhere classified: Secondary | ICD-10-CM | POA: Diagnosis not present

## 2016-06-01 DIAGNOSIS — R2689 Other abnormalities of gait and mobility: Secondary | ICD-10-CM | POA: Diagnosis not present

## 2016-06-01 DIAGNOSIS — M6281 Muscle weakness (generalized): Secondary | ICD-10-CM | POA: Diagnosis not present

## 2016-06-01 DIAGNOSIS — M25561 Pain in right knee: Secondary | ICD-10-CM | POA: Diagnosis not present

## 2016-06-06 DIAGNOSIS — R2689 Other abnormalities of gait and mobility: Secondary | ICD-10-CM | POA: Diagnosis not present

## 2016-06-06 DIAGNOSIS — M25661 Stiffness of right knee, not elsewhere classified: Secondary | ICD-10-CM | POA: Diagnosis not present

## 2016-06-06 DIAGNOSIS — M25561 Pain in right knee: Secondary | ICD-10-CM | POA: Diagnosis not present

## 2016-06-06 DIAGNOSIS — M6281 Muscle weakness (generalized): Secondary | ICD-10-CM | POA: Diagnosis not present

## 2016-06-14 DIAGNOSIS — M25511 Pain in right shoulder: Secondary | ICD-10-CM | POA: Diagnosis not present

## 2016-06-14 DIAGNOSIS — M25661 Stiffness of right knee, not elsewhere classified: Secondary | ICD-10-CM | POA: Diagnosis not present

## 2016-06-14 DIAGNOSIS — M6281 Muscle weakness (generalized): Secondary | ICD-10-CM | POA: Diagnosis not present

## 2016-06-16 DIAGNOSIS — M25661 Stiffness of right knee, not elsewhere classified: Secondary | ICD-10-CM | POA: Diagnosis not present

## 2016-06-16 DIAGNOSIS — M25561 Pain in right knee: Secondary | ICD-10-CM | POA: Diagnosis not present

## 2016-06-16 DIAGNOSIS — R2689 Other abnormalities of gait and mobility: Secondary | ICD-10-CM | POA: Diagnosis not present

## 2016-06-16 DIAGNOSIS — M6281 Muscle weakness (generalized): Secondary | ICD-10-CM | POA: Diagnosis not present

## 2016-06-21 ENCOUNTER — Encounter: Payer: Self-pay | Admitting: Family Medicine

## 2016-06-21 DIAGNOSIS — M25661 Stiffness of right knee, not elsewhere classified: Secondary | ICD-10-CM | POA: Diagnosis not present

## 2016-06-21 DIAGNOSIS — M25561 Pain in right knee: Secondary | ICD-10-CM | POA: Diagnosis not present

## 2016-06-21 DIAGNOSIS — R2689 Other abnormalities of gait and mobility: Secondary | ICD-10-CM | POA: Diagnosis not present

## 2016-06-21 DIAGNOSIS — M6281 Muscle weakness (generalized): Secondary | ICD-10-CM | POA: Diagnosis not present

## 2016-06-23 DIAGNOSIS — M6281 Muscle weakness (generalized): Secondary | ICD-10-CM | POA: Diagnosis not present

## 2016-06-23 DIAGNOSIS — M25661 Stiffness of right knee, not elsewhere classified: Secondary | ICD-10-CM | POA: Diagnosis not present

## 2016-06-23 DIAGNOSIS — R2689 Other abnormalities of gait and mobility: Secondary | ICD-10-CM | POA: Diagnosis not present

## 2016-06-23 DIAGNOSIS — M25561 Pain in right knee: Secondary | ICD-10-CM | POA: Diagnosis not present

## 2016-06-28 ENCOUNTER — Encounter: Payer: Self-pay | Admitting: Family Medicine

## 2016-06-28 ENCOUNTER — Ambulatory Visit (INDEPENDENT_AMBULATORY_CARE_PROVIDER_SITE_OTHER): Payer: BLUE CROSS/BLUE SHIELD | Admitting: Family Medicine

## 2016-06-28 VITALS — BP 142/90 | HR 92 | Temp 95.3°F | Resp 16 | Ht 70.0 in | Wt 268.0 lb

## 2016-06-28 DIAGNOSIS — G4710 Hypersomnia, unspecified: Secondary | ICD-10-CM | POA: Diagnosis not present

## 2016-06-28 DIAGNOSIS — F419 Anxiety disorder, unspecified: Secondary | ICD-10-CM

## 2016-06-28 MED ORDER — ESCITALOPRAM OXALATE 10 MG PO TABS: 5.0000 mg | ORAL_TABLET | Freq: Every day | ORAL | 5 refills | Status: DC

## 2016-06-28 NOTE — Progress Notes (Signed)
Patient: Elijah Mora Male    DOB: 1971-05-16   44 y.o.   MRN: [id number] Visit Date: 06/28/2016  Today's Provider: Lelon Huh, MD   Chief Complaint  Patient presents with  . Follow-up   Subjective:    HPI  Follow up for right leg   The patient was last seen for this 8 months ago.  He has since followed up with his orthopedic surgeon who ruled out compartment syndrome, and subsequently referred to vascular surgery and diagnosed with radiation induced claudication. Has since had Fem-pop bypass and symptoms as completely resolved.   ------------------------------------------------------------------------------------  He also reports that his wife has noticed that he stops breathing frequently in his sleep, and snores very loudly. States he is very sleepy during the day and will easily dose off.   He is also here for follow up on anxiety and states he has been doing very well. He would like to see if he can get off of SSRI.       Wt Readings from Last 3 Encounters:  06/28/16 268 lb (121.6 kg)  12/31/15 260 lb (117.9 kg)  11/09/15 256 lb (116.1 kg)                  No Known Allergies   Current Outpatient Prescriptions:  .  aspirin 81 MG tablet, Take 81 mg by mouth daily., Disp: , Rfl:  .  atorvastatin (LIPITOR) 20 MG tablet, Take 20 mg by mouth daily., Disp: , Rfl:  .  cetirizine (ZYRTEC) 10 MG tablet, Take 10 mg by mouth daily., Disp: , Rfl:  .  oxycodone (OXY-IR) 5 MG capsule, Take 5 mg by mouth every 6 (six) hours as needed., Disp: , Rfl:  .  escitalopram (LEXAPRO) 10 MG tablet, TAKE 1 TABLET BY MOUTH EVERY DAY, Disp: 30 tablet, Rfl: 5 .  ibuprofen (ADVIL,MOTRIN) 800 MG tablet, Take 1 tablet (800 mg total) by mouth every 8 (eight) hours as needed., Disp: 90 tablet, Rfl: 0  Review of Systems  Constitutional: Negative.   Cardiovascular: Negative.   Skin: Positive for wound.    Social History  Substance Use Topics  . Smoking status: Never Smoker  .  Smokeless tobacco: Never Used  . Alcohol use 2.4 oz/week    4 Cans of beer per week     Comment: occasional use   Objective:   BP (!) 142/90 (BP Location: Left Arm, Patient Position: Sitting, Cuff Size: Large)   Pulse 92   Temp (!) 95.3 F (35.2 C) (Oral)   Resp 16   Ht 5\' 10"  (1.778 m)   Wt 268 lb (121.6 kg)   BMI 38.45 kg/m   Physical Exam  General Appearance:    Alert, cooperative, no distress, obese  Eyes:    PERRL, conjunctiva/corneas clear, EOM's intact       Lungs:     Clear to auscultation bilaterally, respirations unlabored  Heart:    Regular rate and rhythm  Neurologic:   Awake, alert, oriented x 3. No apparent focal neurological           defect.        Epworth=15    Assessment & Plan:     1. Anxiety Doing well. He would like to wean off if possible. Will start be reducing es citalopram to 1/2 tablet daily for a month, if still doing well after a month can reduce further to 1/2 QOD for a few weeks then d/c. - escitalopram (  LEXAPRO) 10 MG tablet; Take 0.5 tablets (5 mg total) by mouth daily.  Dispense: 1 tablet; Refill: 5  2. Hypersomnia Likely sleep apnea.  - Nocturnal polysomnography; Future         Lelon Huh, MD  [facility]

## 2016-06-28 NOTE — Patient Instructions (Addendum)
You can wean Lexapro by taking 1/2 tablet daily for about 8 days, then take 1/2 tablet every other day until prescription is gone

## 2016-07-04 ENCOUNTER — Telehealth: Payer: Self-pay | Admitting: Family Medicine

## 2016-07-04 NOTE — Telephone Encounter (Signed)
Order for polysomnography faxed to [facility]

## 2016-08-03 DIAGNOSIS — Z48812 Encounter for surgical aftercare following surgery on the circulatory system: Secondary | ICD-10-CM | POA: Diagnosis not present

## 2016-08-03 DIAGNOSIS — I739 Peripheral vascular disease, unspecified: Secondary | ICD-10-CM | POA: Diagnosis not present

## 2016-08-03 DIAGNOSIS — S81801A Unspecified open wound, right lower leg, initial encounter: Secondary | ICD-10-CM | POA: Diagnosis not present

## 2016-08-04 ENCOUNTER — Ambulatory Visit: Payer: BLUE CROSS/BLUE SHIELD | Attending: Otolaryngology

## 2016-08-04 DIAGNOSIS — R0683 Snoring: Secondary | ICD-10-CM | POA: Diagnosis not present

## 2016-08-04 DIAGNOSIS — G4733 Obstructive sleep apnea (adult) (pediatric): Secondary | ICD-10-CM | POA: Insufficient documentation

## 2016-08-04 DIAGNOSIS — G4710 Hypersomnia, unspecified: Secondary | ICD-10-CM | POA: Diagnosis present

## 2016-08-12 ENCOUNTER — Encounter: Payer: Self-pay | Admitting: Family Medicine

## 2016-08-15 ENCOUNTER — Telehealth: Payer: Self-pay | Admitting: Family Medicine

## 2016-08-15 NOTE — Telephone Encounter (Signed)
Order for CPAP faxed to [facility] 08/09/16

## 2016-08-15 NOTE — Telephone Encounter (Signed)
Elijah Mora,  Patient needs to start CPAP 15cm pressure with heat and humidity for obstructive sleep apnea.

## 2016-08-15 NOTE — Telephone Encounter (Signed)
Please advise results? 

## 2016-08-15 NOTE — Telephone Encounter (Signed)
Pt is requesting a call back for the results of his sleep study. Pt stated he got a notification on MyChart that the results were in but when he tried to look no results were attached. Please advise. Thanks TNP

## 2016-08-23 DIAGNOSIS — G4733 Obstructive sleep apnea (adult) (pediatric): Secondary | ICD-10-CM | POA: Diagnosis not present

## 2016-09-22 DIAGNOSIS — G4733 Obstructive sleep apnea (adult) (pediatric): Secondary | ICD-10-CM | POA: Diagnosis not present

## 2016-10-10 DIAGNOSIS — G4733 Obstructive sleep apnea (adult) (pediatric): Secondary | ICD-10-CM | POA: Diagnosis not present

## 2016-10-23 DIAGNOSIS — G4733 Obstructive sleep apnea (adult) (pediatric): Secondary | ICD-10-CM | POA: Diagnosis not present

## 2016-11-23 DIAGNOSIS — G4733 Obstructive sleep apnea (adult) (pediatric): Secondary | ICD-10-CM | POA: Diagnosis not present

## 2016-12-21 DIAGNOSIS — G4733 Obstructive sleep apnea (adult) (pediatric): Secondary | ICD-10-CM | POA: Diagnosis not present

## 2017-01-21 DIAGNOSIS — G4733 Obstructive sleep apnea (adult) (pediatric): Secondary | ICD-10-CM | POA: Diagnosis not present

## 2017-01-24 ENCOUNTER — Ambulatory Visit (INDEPENDENT_AMBULATORY_CARE_PROVIDER_SITE_OTHER): Payer: BLUE CROSS/BLUE SHIELD | Admitting: Family Medicine

## 2017-01-24 ENCOUNTER — Encounter: Payer: Self-pay | Admitting: Family Medicine

## 2017-01-24 VITALS — BP 136/90 | HR 75 | Temp 98.0°F | Resp 16 | Wt 254.0 lb

## 2017-01-24 DIAGNOSIS — R319 Hematuria, unspecified: Secondary | ICD-10-CM

## 2017-01-24 DIAGNOSIS — R310 Gross hematuria: Secondary | ICD-10-CM | POA: Diagnosis not present

## 2017-01-24 LAB — POCT URINALYSIS DIPSTICK
BILIRUBIN UA: NEGATIVE
Glucose, UA: NEGATIVE
KETONES UA: NEGATIVE
LEUKOCYTES UA: NEGATIVE
Nitrite, UA: NEGATIVE
PH UA: 6 (ref 5.0–8.0)
PROTEIN UA: NEGATIVE
SPEC GRAV UA: 1.025 (ref 1.010–1.025)
Urobilinogen, UA: 0.2 E.U./dL

## 2017-01-24 MED ORDER — OXYCODONE-ACETAMINOPHEN 10-325 MG PO TABS: 1.0000 | ORAL_TABLET | ORAL | 0 refills | Status: DC | PRN

## 2017-01-24 MED ORDER — TAMSULOSIN HCL 0.4 MG PO CAPS: 0.4000 mg | ORAL_CAPSULE | Freq: Every day | ORAL | 0 refills | Status: DC

## 2017-01-24 NOTE — Progress Notes (Signed)
Patient: Elijah Mora Male    DOB: 11-25-1970   46 y.o.   MRN: [id number] Visit Date: 01/24/2017  Today's Provider: Lelon Huh, MD   Chief Complaint  Patient presents with  . Hematuria   Subjective:    Hematuria  This is a new problem. The current episode started yesterday. The problem is unchanged. He describes the hematuria as gross hematuria. He reports no clotting in his urine stream. Obstructive symptoms do not include dribbling, incomplete emptying, an intermittent stream or a weak stream. Associated symptoms include abdominal pain (pressure sensation). Pertinent negatives include no chills, fever, nausea or vomiting. His past medical history is significant for kidney stones.   Last kidney stone was about year ago. Has had eight stones over the years. Has history of calcium oxalate stones. Presentation of prior stones were identical to current symptoms. He states there has not been any bright red blood, only dark coca-colored urine.     No Known Allergies   Current Outpatient Prescriptions:  .  aspirin 81 MG tablet, Take 81 mg by mouth daily., Disp: , Rfl:  .  cetirizine (ZYRTEC) 10 MG tablet, Take 10 mg by mouth daily., Disp: , Rfl:  .  escitalopram (LEXAPRO) 10 MG tablet, Take 0.5 tablets (5 mg total) by mouth daily., Disp: 1 tablet, Rfl: 5 .  ibuprofen (ADVIL,MOTRIN) 800 MG tablet, Take 1 tablet (800 mg total) by mouth every 8 (eight) hours as needed., Disp: 90 tablet, Rfl: 0 .  oxycodone (OXY-IR) 5 MG capsule, Take 5 mg by mouth every 6 (six) hours as needed., Disp: , Rfl:  .  atorvastatin (LIPITOR) 20 MG tablet, Take 20 mg by mouth daily., Disp: , Rfl:   Review of Systems  Constitutional: Negative for appetite change, chills and fever.  Respiratory: Negative for chest tightness, shortness of breath and wheezing.   Cardiovascular: Negative for chest pain and palpitations.  Gastrointestinal: Positive for abdominal pain (pressure sensation). Negative for  nausea and vomiting.  Genitourinary: Positive for hematuria. Negative for difficulty urinating and incomplete emptying.  Musculoskeletal: Negative for back pain.    Social History  Substance Use Topics  . Smoking status: Never Smoker  . Smokeless tobacco: Never Used  . Alcohol use 2.4 oz/week    4 Cans of beer per week     Comment: occasional use   Objective:   BP 136/90 (BP Location: Left Arm, Patient Position: Sitting, Cuff Size: Large)   Pulse 75   Temp 98 F (36.7 C) (Oral)   Resp 16   Wt 254 lb (115.2 kg)   SpO2 99% Comment: room air  BMI 36.45 kg/m  There were no vitals filed for this visit.   Physical Exam  General Appearance:    Alert, cooperative, no distress  Eyes:    PERRL, conjunctiva/corneas clear, EOM's intact       Lungs:     Clear to auscultation bilaterally, respirations unlabored  Heart:    Regular rate and rhythm  Abdomen:   . CVA tenderness is present on the right     Results for orders placed or performed in visit on 01/24/17  POCT Urinalysis Dipstick  Result Value Ref Range   Color, UA amber    Clarity, UA cloudy    Glucose, UA negative    Bilirubin, UA negative    Ketones, UA negative    Spec Grav, UA 1.025 1.010 - 1.025   Blood, UA Large (Hemolyzed)    pH, UA  6.0 5.0 - 8.0   Protein, UA negative    Urobilinogen, UA 0.2 0.2 or 1.0 E.U./dL   Nitrite, UA negative    Leukocytes, UA Negative Negative       Assessment & Plan:     1. Hematuria, unspecified type Symptoms identical to previous episodes of kidney stone passage. He still has known stones that have not yet passed. Rx for pain medication and tamsulosin to take once he starts having pain indicating passage of stone into ureter.  - POCT Urinalysis Dipstick  2. Gross hematuria  - oxyCODONE-acetaminophen (PERCOCET) 10-325 MG tablet; Take 1 tablet by mouth every 4 (four) hours as needed for pain.  Dispense: 30 tablet; Refill: 0 - tamsulosin (FLOMAX) 0.4 MG CAPS capsule; Take 1  capsule (0.4 mg total) by mouth daily.  Dispense: 30 capsule; Refill: 0       Lelon Huh, MD  [facility]

## 2017-01-28 ENCOUNTER — Encounter: Payer: Self-pay | Admitting: Emergency Medicine

## 2017-01-28 ENCOUNTER — Emergency Department
Admission: EM | Admit: 2017-01-28 | Discharge: 2017-01-28 | Disposition: A | Discharge status: Home / Self Care | Payer: BLUE CROSS/BLUE SHIELD | Attending: Emergency Medicine | Admitting: Emergency Medicine

## 2017-01-28 ENCOUNTER — Emergency Department: Payer: BLUE CROSS/BLUE SHIELD

## 2017-01-28 DIAGNOSIS — Z79899 Other long term (current) drug therapy: Secondary | ICD-10-CM | POA: Insufficient documentation

## 2017-01-28 DIAGNOSIS — R109 Unspecified abdominal pain: Secondary | ICD-10-CM | POA: Diagnosis not present

## 2017-01-28 DIAGNOSIS — Z7982 Long term (current) use of aspirin: Secondary | ICD-10-CM | POA: Diagnosis not present

## 2017-01-28 DIAGNOSIS — Z85830 Personal history of malignant neoplasm of bone: Secondary | ICD-10-CM | POA: Diagnosis not present

## 2017-01-28 DIAGNOSIS — N200 Calculus of kidney: Secondary | ICD-10-CM

## 2017-01-28 DIAGNOSIS — N202 Calculus of kidney with calculus of ureter: Secondary | ICD-10-CM | POA: Diagnosis not present

## 2017-01-28 LAB — CBC
HEMATOCRIT: 49.2 % (ref 40.0–52.0)
Hemoglobin: 16.5 g/dL (ref 13.0–18.0)
MCH: 29.9 pg (ref 26.0–34.0)
MCHC: 33.6 g/dL (ref 32.0–36.0)
MCV: 89.1 fL (ref 80.0–100.0)
PLATELETS: 266 10*3/uL (ref 150–440)
RBC: 5.52 MIL/uL (ref 4.40–5.90)
RDW: 13.1 % (ref 11.5–14.5)
WBC: 17.4 10*3/uL — AB (ref 3.8–10.6)

## 2017-01-28 LAB — BASIC METABOLIC PANEL
Anion gap: 9 (ref 5–15)
BUN: 18 mg/dL (ref 6–20)
CALCIUM: 9.7 mg/dL (ref 8.9–10.3)
CO2: 28 mmol/L (ref 22–32)
Chloride: 100 mmol/L — ABNORMAL LOW (ref 101–111)
Creatinine, Ser: 1.14 mg/dL (ref 0.61–1.24)
GFR calc Af Amer: >60 mL/min (ref >60)
GLUCOSE: 111 mg/dL — AB (ref 65–99)
POTASSIUM: 4.4 mmol/L (ref 3.5–5.1)
SODIUM: 137 mmol/L (ref 135–145)

## 2017-01-28 LAB — URINALYSIS, COMPLETE (UACMP) WITH MICROSCOPIC
BACTERIA UA: NONE SEEN
Bilirubin Urine: NEGATIVE
Glucose, UA: NEGATIVE mg/dL
KETONES UR: NEGATIVE mg/dL
Leukocytes, UA: NEGATIVE
Nitrite: NEGATIVE
PROTEIN: NEGATIVE mg/dL
Specific Gravity, Urine: 1.014 (ref 1.005–1.030)
WBC UA: NONE SEEN WBC/hpf (ref 0–5)
pH: 6 (ref 5.0–8.0)

## 2017-01-28 MED ORDER — TAMSULOSIN HCL 0.4 MG PO CAPS: 0.4000 mg | ORAL_CAPSULE | Freq: Every day | ORAL | 0 refills | Status: DC

## 2017-01-28 MED ORDER — FENTANYL CITRATE (PF) 100 MCG/2ML IJ SOLN
50.0000 ug | INTRAMUSCULAR | Status: AC | PRN
  Administered 2017-01-28 (×2): 50 ug via INTRAVENOUS
  Filled 2017-01-28: qty 2

## 2017-01-28 MED ORDER — MORPHINE SULFATE (PF) 4 MG/ML IV SOLN
4.0000 mg | Freq: Once | INTRAVENOUS | Status: AC
Start: 2017-01-28 — End: 2017-01-28
  Administered 2017-01-28: 4 mg via INTRAVENOUS
  Filled 2017-01-28: qty 1

## 2017-01-28 MED ORDER — KETOROLAC TROMETHAMINE 30 MG/ML IJ SOLN
30.0000 mg | Freq: Once | INTRAMUSCULAR | Status: AC
  Administered 2017-01-28: 30 mg via INTRAVENOUS
  Filled 2017-01-28: qty 1

## 2017-01-28 MED ORDER — ONDANSETRON HCL 4 MG/2ML IJ SOLN
4.0000 mg | Freq: Once | INTRAMUSCULAR | Status: AC
  Administered 2017-01-28: 4 mg via INTRAVENOUS
  Filled 2017-01-28: qty 2

## 2017-01-28 MED ORDER — ONDANSETRON 4 MG PO TBDP: 4.0000 mg | ORAL_TABLET | Freq: Three times a day (TID) | ORAL | 0 refills | Status: DC | PRN

## 2017-01-28 MED ORDER — OXYCODONE-ACETAMINOPHEN 5-325 MG PO TABS: 1.0000 | ORAL_TABLET | Freq: Four times a day (QID) | ORAL | 0 refills | Status: DC | PRN

## 2017-01-28 MED ORDER — SODIUM CHLORIDE 0.9 % IV BOLUS (SEPSIS)
1000.0000 mL | Freq: Once | INTRAVENOUS | Status: AC
  Administered 2017-01-28: 1000 mL via INTRAVENOUS

## 2017-01-28 NOTE — ED Provider Notes (Signed)
[facility] Emergency Department Provider Note  Time seen: 1:36 PM  I have reviewed the triage vital signs and the nursing notes.   HISTORY  Chief Complaint Flank Pain    HPI Elijah Mora is a 46 y.o. male With a past medical history of kidney stones who presents to the emergency department for right flank pain. According to the patient for the past 5 days he has been expressing intermittent right flank pain worse since this morning. The patient states nausea with one episode of vomiting this morning. Denies any fever, dysuria, does state hematuria. Describes his pain as severe sharp 9/10 intermittent for the past 5 days but constant since this morning.  Past Medical History:  Diagnosis Date  . 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 Renal disorder    kidney stones    Patient Active Problem List   Diagnosis Date Noted  . Right calf pain 10/19/2015  . Pain in right shin 10/19/2015  . Acute anxiety 09/08/2015  . 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 Dizziness 05/08/2015  . Awareness of heartbeats 05/08/2015    Past Surgical History:  Procedure Laterality Date  . 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 Dr. Konrad Penta  . KIDNEY STONE SURGERY    . Synovial cancer     behind right knee. Off Chemo 06/1999    Prior to Admission medications   Medication Sig Start Date End Date Taking? Authorizing Provider  aspirin 81 MG tablet Take 81 mg by mouth daily.    [provider]  atorvastatin (LIPITOR) 20 MG tablet Take 20 mg by mouth daily.    [provider]  cetirizine (ZYRTEC) 10 MG tablet Take 10 mg by mouth daily.    [provider]  escitalopram (LEXAPRO) 10 MG tablet Take 0.5 tablets (5 mg total) by mouth daily. 06/28/16   Birdie Sons, MD  ibuprofen (ADVIL,MOTRIN) 800 MG tablet Take 1 tablet (800 mg total) by mouth every 8 (eight) hours as needed.  12/31/15   Mar Daring, PA-C  oxyCODONE-acetaminophen (PERCOCET) 10-325 MG tablet Take 1 tablet by mouth every 4 (four) hours as needed for pain. 01/24/17   Birdie Sons, MD  tamsulosin (FLOMAX) 0.4 MG CAPS capsule Take 1 capsule (0.4 mg total) by mouth daily. 01/24/17 02/23/17  Birdie Sons, MD    No Known Allergies  Family History  Problem Relation Age of Onset  . Cancer Mother 32    Breast  . Kidney Stones Mother   . Diabetes Father   . Hypertension Father   . Breast cancer Maternal Grandmother   . Kidney Stones Other   . Lymphoma Maternal Grandfather     Social History Social History  Substance Use Topics  . Smoking status: Never Smoker  . Smokeless tobacco: Never Used  . Alcohol use 2.4 oz/week    4 Cans of beer per week     Comment: occasional use    Review of Systems Constitutional: Negative for fever. Eyes: Negative for visual changes. ENT: Negative for congestion Cardiovascular: Negative for chest pain. Respiratory: Negative for shortness of breath. Gastrointestinal: 9/10 sharp right flank pain. Positive for nausea with one episode of vomiting. Negative for diarrhea. Genitourinary: Negative for dysuria. Positive for hematuria. Musculoskeletal: Positive right back pain Skin: Negative for rash. Neurological: Negative for headache All other ROS negative  ____________________________________________   PHYSICAL EXAM:  VITAL SIGNS: ED Triage Vitals  Enc  Vitals Group     BP 01/28/17 1227 (!) 118/99     Pulse Rate 01/28/17 1227 95     Resp 01/28/17 1227 18     Temp 01/28/17 1227 98.2 F (36.8 C)     Temp Source 01/28/17 1227 Oral     SpO2 01/28/17 1227 100 %     Weight 01/28/17 1228 245 lb (111.1 kg)     Height 01/28/17 1228 5' 10"  (1.778 m)     Head Circumference --      Peak Flow --      Pain Score 01/28/17 1227 10     Pain Loc --      Pain Edu? --      Excl. in [place]? --    Constitutional: Alert and oriented. Mild distress due to right  flank pain. Eyes: Normal exam ENT   Head: Normocephalic and atraumatic.   Mouth/Throat: Mucous membranes are moist. Cardiovascular: Normal rate, regular rhythm. No murmur Respiratory: Normal respiratory effort without tachypnea nor retractions. Breath sounds are clear  Gastrointestinal: Soft and nontender. No distention. Moderate CVA tenderness. Musculoskeletal: Nontender with normal range of motion in all extremities.  Neurologic:  Normal speech and language. No gross focal neurologic deficits Skin:  Skin is warm, dry and intact.  Psychiatric: Mood and affect are normal.   ____________________________________________   RADIOLOGY  CT consistent with 7.5 mm right proximal ureteral stone.  ____________________________________________   INITIAL IMPRESSION / ASSESSMENT AND PLAN / ED COURSE  Pertinent labs & imaging results that were available during my care of the patient were reviewed by me and considered in my medical decision making (see chart for details).  The patient presents to the emergency department with right flank pain 9/10 in severity and intermittent over the past for 5 days but constant since this morning with one episode of vomiting. Patient has a history of kidney stones in the past which this feels similar. We will obtain labs including a urinalysis. We will obtain a CT renal scan as the patient has required multiple procedures in the past. Patient denies any fever. We will treat with morphine, Toradol, Zofran and IV fluids while awaiting further results.   CT consistent with proximal 7.5 mm right ureteral stone. Patient states his pain is gone at this point or a 1/10. Much improved appears comfortable, resting comfortably in bed. We'll discharge with Percocet and Flomax. Patient will call Dr. Erlene Quan Monday morning to arrange a follow-up appointment. I discussed with the patient return precautions including increased pain or fever. I also discussed with the patient  low likelihood that this would pass without needing a procedure. ____________________________________________   FINAL CLINICAL IMPRESSION(S) / ED DIAGNOSES  Right flank pain Ureterolithiasis   Harvest Dark, MD 01/28/17 1418

## 2017-01-28 NOTE — ED Triage Notes (Signed)
Patient presents to the ED with severe right flank pain that began this am.  Patient is obviously uncomfortable in triage, moving around and moaning.  Patient reports history of kidney stones and states this feels similarly.  Patient reports he noted hematuria several days ago but pain just started today.

## 2017-01-30 ENCOUNTER — Telehealth: Payer: Self-pay

## 2017-01-30 ENCOUNTER — Other Ambulatory Visit: Payer: Self-pay | Admitting: Radiology

## 2017-01-30 ENCOUNTER — Ambulatory Visit: Payer: BLUE CROSS/BLUE SHIELD | Admitting: Urology

## 2017-01-30 ENCOUNTER — Encounter: Payer: Self-pay | Admitting: Urology

## 2017-01-30 VITALS — BP 170/80 | HR 92 | Ht 70.0 in | Wt 245.0 lb

## 2017-01-30 DIAGNOSIS — N200 Calculus of kidney: Secondary | ICD-10-CM | POA: Diagnosis not present

## 2017-01-30 DIAGNOSIS — N201 Calculus of ureter: Secondary | ICD-10-CM

## 2017-01-30 DIAGNOSIS — R109 Unspecified abdominal pain: Secondary | ICD-10-CM

## 2017-01-30 NOTE — Progress Notes (Signed)
01/30/2017 1:53 PM   Elijah Mora 19-Nov-1970 [id number]  Referring provider: Birdie Sons, MD [address] [place], [place] [address]  Chief Complaint  Patient presents with  . Nephrolithiasis    New Patient    HPI: 46 year old male who presents today for further evaluation of a 7.5 mm right proximal ureteral stone. He was seen in the emergency room on Saturday with gross hematuria, right flank pain and nausea and vomiting.  At that point in time, he been having intermittent flank pain for about 5 days but had acute exacerbation of his pain.     In the emergency room, he was afebrile and hemodynamically stable. His hemoglobin was ultimately able to be controlled. He did have a presumed reactive leukocytosis to 17. His UA was negative other than for blood. Creatinine was mildly elevated to 1.1.  He continues to have intermittent mild pain. He's been taking Percocet as needed. No nausea or vomiting. No fevers or chills.  He does have a personal history of kidney stones, last spontaneously passed a stone about a year ago.  He has passed 8 stones in 22 year.  He has had URS x 1 and ESWL x1 (~10 years ago, effective).    He does take baby aspirin, last dose this AM at 6 am.    PMH: Past Medical History:  Diagnosis Date  . Anxiety   . GERD (gastroesophageal reflux disease)   . 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 Renal disorder    kidney stones  . Sleep apnea     Surgical History: Past Surgical History:  Procedure Laterality Date  . 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 Dr. Konrad Penta  . KIDNEY STONE SURGERY    . Synovial cancer     behind right knee. Off Chemo 06/1999    Home Medications:  Allergies as of 01/30/2017   No Known Allergies     Medication List       Accurate as of 01/30/17  1:53 PM. Always use your most recent med list.          aspirin 81 MG tablet Take 81 mg by  mouth daily.   atorvastatin 20 MG tablet Commonly known as:  LIPITOR Take 20 mg by mouth daily.   cetirizine 10 MG tablet Commonly known as:  ZYRTEC Take 10 mg by mouth daily.   escitalopram 10 MG tablet Commonly known as:  LEXAPRO Take 0.5 tablets (5 mg total) by mouth daily.   ibuprofen 800 MG tablet Commonly known as:  ADVIL,MOTRIN Take 1 tablet (800 mg total) by mouth every 8 (eight) hours as needed.   ondansetron 4 MG disintegrating tablet Commonly known as:  ZOFRAN ODT Take 1 tablet (4 mg total) by mouth every 8 (eight) hours as needed for nausea or vomiting.   oxyCODONE-acetaminophen 5-325 MG tablet Commonly known as:  ROXICET Take 1 tablet by mouth every 6 (six) hours as needed.   tamsulosin 0.4 MG Caps capsule Commonly known as:  FLOMAX Take 1 capsule (0.4 mg total) by mouth daily.       Allergies: No Known Allergies  Family History: Family History  Problem Relation Age of Onset  . Cancer Mother 14    Breast  . Kidney Stones Mother   . Diabetes Father   . Hypertension Father   . Breast cancer Maternal Grandmother   . Kidney Stones Other   . Lymphoma Maternal Grandfather  Social History:  reports that he has never smoked. He has never used smokeless tobacco. He reports that he drinks about 2.4 oz of alcohol per week . He reports that he does not use drugs.  ROS: UROLOGY Frequent Urination?: No Hard to postpone urination?: No Burning/pain with urination?: No Get up at night to urinate?: No Leakage of urine?: No Urine stream starts and stops?: No Trouble starting stream?: No Do you have to strain to urinate?: No Blood in urine?: Yes Urinary tract infection?: No Sexually transmitted disease?: No Injury to kidneys or bladder?: No Painful intercourse?: No Weak stream?: No Erection problems?: No Penile pain?: No  Gastrointestinal Nausea?: Yes Vomiting?: Yes Indigestion/heartburn?: No Diarrhea?: Yes Constipation?:  No  Constitutional Fever: No Night sweats?: No Weight loss?: No Fatigue?: No  Skin Skin rash/lesions?: No Itching?: No  Eyes Blurred vision?: No Double vision?: No  Ears/Nose/Throat Sore throat?: No Sinus problems?: No  Hematologic/Lymphatic Swollen glands?: No Easy bruising?: No  Cardiovascular Leg swelling?: No Chest pain?: No  Respiratory Cough?: No Shortness of breath?: No  Endocrine Excessive thirst?: No  Musculoskeletal Back pain?: Yes Joint pain?: No  Neurological Headaches?: No Dizziness?: No  Psychologic Depression?: No Anxiety?: Yes  Physical Exam: BP (!) 170/80   Pulse 92   Ht 5\' 10"  (1.778 m)   Wt 245 lb (111.1 kg)   BMI 35.15 kg/m   Constitutional:  Alert and oriented, No acute distress. HEENT: [place] AT, moist mucus membranes.  Trachea midline, no masses. Cardiovascular: No clubbing, cyanosis, or edema. Respiratory: Normal respiratory effort, no increased work of breathing. GI: Abdomen is soft, nontender, nondistended, no abdominal masses.  Obese.   GU: No CVA tenderness.  Skin: No rashes, bruises or suspicious lesions. Neurologic: Grossly intact, no focal deficits, moving all 4 extremities. Psychiatric: Normal mood and affect.  Laboratory Data: Lab Results  Component Value Date   WBC 17.4 (H) 01/28/2017   HGB 16.5 01/28/2017   HCT 49.2 01/28/2017   MCV 89.1 01/28/2017   PLT 266 01/28/2017    Lab Results  Component Value Date   CREATININE 1.14 01/28/2017    Urinalysis    Component Value Date/Time   COLORURINE STRAW (A) 01/28/2017 1235   APPEARANCEUR CLEAR (A) 01/28/2017 1235   APPEARANCEUR Clear 08/05/2013 0247   LABSPEC 1.014 01/28/2017 1235   LABSPEC 1.024 08/05/2013 0247   PHURINE 6.0 01/28/2017 1235   GLUCOSEU NEGATIVE 01/28/2017 1235   GLUCOSEU Negative 08/05/2013 0247   HGBUR LARGE (A) 01/28/2017 1235   BILIRUBINUR NEGATIVE 01/28/2017 1235   BILIRUBINUR negative 01/24/2017 1417   BILIRUBINUR Negative  08/05/2013 0247   KETONESUR NEGATIVE 01/28/2017 1235   PROTEINUR NEGATIVE 01/28/2017 1235   UROBILINOGEN 0.2 01/24/2017 1417   NITRITE NEGATIVE 01/28/2017 1235   LEUKOCYTESUR NEGATIVE 01/28/2017 1235   LEUKOCYTESUR Negative 08/05/2013 0247    Pertinent Imaging: CLINICAL DATA:  Right flank pain and hematuria.  EXAM: CT ABDOMEN AND PELVIS WITHOUT CONTRAST  TECHNIQUE: Multidetector CT imaging of the abdomen and pelvis was performed following the standard protocol without IV contrast.  COMPARISON:  None.  FINDINGS: Lower chest: No acute abnormality.  Hepatobiliary: No focal liver abnormality is seen. No gallstones, gallbladder wall thickening, or biliary dilatation.  Pancreas: Unremarkable. No pancreatic ductal dilatation or surrounding inflammatory changes.  Spleen: Normal in size without focal abnormality.  Adrenals/Urinary Tract: No stones are seen in either kidney. Hydronephrosis and perinephric stranding fall are seen on the right. An obstructing 7.5 mm stone is seen in the proximal right  ureter. The remainder of the right ureter is normal. The left kidney, left ureter, and bladder are normal.  Stomach/Bowel: The stomach and small bowel are normal. Mild fecal loading in the colon. The colon is otherwise normal. The appendix is normal.  Vascular/Lymphatic: No significant vascular findings are present. No enlarged abdominal or pelvic lymph nodes.  Reproductive: Prostate is unremarkable.  Other: No free air or free fluid.  Musculoskeletal: Sclerotic foci in the proximal femurs are likely bone islands. No acute bony abnormalities are identified.  IMPRESSION: 1. 7.5 mm obstructing stone in the proximal right ureter with mild hydronephrosis and perinephric stranding. 2. No other acute abnormalities.   Electronically Signed   By: Dorise Bullion III M.D   On: 01/28/2017 14:07  CT imaging was reviewed personally today and with the patient. The  stone to skin distance is proximally 17 cm. Hounsfield units 1080.    Assessment & Plan:    1. Right ureteral calculus 7.5 mm right proximal ureteral stone.  Although he was noted to have a leukocytosis, he has no other signs or symptoms of infection including a negative urinalysis and no fevers or chills. Options including medical expulsive therapy, ureteroscopy, and shockwave lithotripsy reviewed along with the risk and benefits of each.   We discussed the risks and benefits of both including bleeding, infection, damage to surrounding structures, efficacy with need for possible further intervention, and need for temporary ureteral stent.  Although he is not a perfect candidate for shockwave lithotripsy given the stone to skin distance of 17 cm, he is undergone this successfully in the past and would like to try this again. Based on his parameters, I estimated approximate 50% stone clearance rate with shockwave lithotripsy.  All of his questions were answered today. He is agreeable to plan.  Preop urine culture.    - CULTURE, URINE COMPREHENSIVE  2. Right flank pain Secondary to #1  Schedule right ESWL  Hollice Espy, MD  [facility] [address] [place], [place] [address] [phone]

## 2017-01-30 NOTE — Telephone Encounter (Signed)
This guy need to be seen today    Hollice Espy, MD    ----- Message -----  From: Dennis Bast, RN  Sent: 01/28/2017  2:56 PM  To: Hollice Espy, MD

## 2017-01-30 NOTE — Telephone Encounter (Signed)
LM for him to CB to confirm his app for today.  Sharyn Lull

## 2017-02-01 ENCOUNTER — Emergency Department
Admission: EM | Admit: 2017-02-01 | Discharge: 2017-02-02 | Disposition: A | Discharge status: Home / Self Care | Payer: BLUE CROSS/BLUE SHIELD | Source: Home / Self Care | Attending: Emergency Medicine | Admitting: Emergency Medicine

## 2017-02-01 DIAGNOSIS — Z7982 Long term (current) use of aspirin: Secondary | ICD-10-CM | POA: Diagnosis not present

## 2017-02-01 DIAGNOSIS — Z79899 Other long term (current) drug therapy: Secondary | ICD-10-CM | POA: Diagnosis not present

## 2017-02-01 DIAGNOSIS — N201 Calculus of ureter: Secondary | ICD-10-CM | POA: Diagnosis not present

## 2017-02-01 DIAGNOSIS — G4730 Sleep apnea, unspecified: Secondary | ICD-10-CM | POA: Diagnosis not present

## 2017-02-01 DIAGNOSIS — N200 Calculus of kidney: Secondary | ICD-10-CM

## 2017-02-01 DIAGNOSIS — K219 Gastro-esophageal reflux disease without esophagitis: Secondary | ICD-10-CM | POA: Diagnosis not present

## 2017-02-01 DIAGNOSIS — F419 Anxiety disorder, unspecified: Secondary | ICD-10-CM | POA: Diagnosis not present

## 2017-02-01 LAB — URINALYSIS, COMPLETE (UACMP) WITH MICROSCOPIC
Bacteria, UA: NONE SEEN
Bilirubin Urine: NEGATIVE
Glucose, UA: NEGATIVE mg/dL
Ketones, ur: NEGATIVE mg/dL
Leukocytes, UA: NEGATIVE
Nitrite: NEGATIVE
Protein, ur: NEGATIVE mg/dL
Specific Gravity, Urine: 1.013 (ref 1.005–1.030)
Squamous Epithelial / HPF: NONE SEEN
pH: 6 (ref 5.0–8.0)

## 2017-02-01 LAB — BASIC METABOLIC PANEL
Anion gap: 9 (ref 5–15)
BUN: 18 mg/dL (ref 6–20)
CO2: 27 mmol/L (ref 22–32)
Calcium: 9.8 mg/dL (ref 8.9–10.3)
Chloride: 103 mmol/L (ref 101–111)
Creatinine, Ser: 1.64 mg/dL — ABNORMAL HIGH (ref 0.61–1.24)
GFR calc Af Amer: 58 mL/min — ABNORMAL LOW (ref >60)
GFR calc non Af Amer: 50 mL/min — ABNORMAL LOW (ref >60)
Glucose, Bld: 118 mg/dL — ABNORMAL HIGH (ref 65–99)
Potassium: 4.4 mmol/L (ref 3.5–5.1)
Sodium: 139 mmol/L (ref 135–145)

## 2017-02-01 LAB — CBC
HCT: 49.7 % (ref 40.0–52.0)
Hemoglobin: 17 g/dL (ref 13.0–18.0)
MCH: 30 pg (ref 26.0–34.0)
MCHC: 34.3 g/dL (ref 32.0–36.0)
MCV: 87.7 fL (ref 80.0–100.0)
Platelets: 300 10*3/uL (ref 150–440)
RBC: 5.66 MIL/uL (ref 4.40–5.90)
RDW: 13.2 % (ref 11.5–14.5)
WBC: 16.8 10*3/uL — ABNORMAL HIGH (ref 3.8–10.6)

## 2017-02-01 MED ORDER — OXYCODONE-ACETAMINOPHEN 5-325 MG PO TABS
2.0000 | ORAL_TABLET | Freq: Once | ORAL | Status: AC
  Administered 2017-02-02: 2 via ORAL
  Filled 2017-02-01: qty 2

## 2017-02-01 MED ORDER — ONDANSETRON HCL 4 MG/2ML IJ SOLN
4.0000 mg | Freq: Once | INTRAMUSCULAR | Status: AC
  Administered 2017-02-01: 4 mg via INTRAVENOUS
  Filled 2017-02-01: qty 2

## 2017-02-01 MED ORDER — HYDROMORPHONE HCL 1 MG/ML IJ SOLN
1.0000 mg | Freq: Once | INTRAMUSCULAR | Status: AC
  Administered 2017-02-01: 1 mg via INTRAVENOUS
  Filled 2017-02-01: qty 1

## 2017-02-01 NOTE — ED Triage Notes (Signed)
Pt presents to ED with c/o RIGHT groin/lower abd pain r/t a diagnosed kidney stone. Pt reports been seen for same on Saturday, d/x'd with a 58mm stone and has lithotripsy scheduled for tomorrow at 830am, but unable to wait d/t pain.

## 2017-02-01 NOTE — ED Provider Notes (Signed)
[facility] Emergency Department Provider Note  ____________________________________________   First MD Initiated Contact with Patient 02/01/17 2304     (approximate)  I have reviewed the triage vital signs and the nursing notes.   HISTORY  Chief Complaint Nephrolithiasis   HPI Elijah Mora is a 46 y.o. male with a history of kidney stones who is presenting to the emergency department today with right flank pain radiating around to his abdomen. He was here on May 5 and was diagnosed with a 7.5, proximal stone. He has come in for lithotripsy tomorrow at 8:30 AM. He says the pain started back up earlier this morning and has been increasing throughout the day. He says that he had been vomiting all day as well as having diaphoresis. He says that he has been drinking plenty of fluids. He is taking 1 Percocet every 4 hours to control the pain. He said the pain was a 9 or 10 earlier today but after blood in the emergency department he is having a 3 out of 10 pain.   Past Medical History:  Diagnosis Date  . Anxiety   . GERD (gastroesophageal reflux disease)   . 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 Renal disorder    kidney stones  . Sleep apnea     Patient Active Problem List   Diagnosis Date Noted  . Right calf pain 10/19/2015  . Pain in right shin 10/19/2015  . Acute anxiety 09/08/2015  . 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 Dizziness 05/08/2015  . Awareness of heartbeats 05/08/2015    Past Surgical History:  Procedure Laterality Date  . 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 Dr. Konrad Penta  . KIDNEY STONE SURGERY    . Synovial cancer     behind right knee. Off Chemo 06/1999    Prior to Admission medications   Medication Sig Start Date End Date Taking? Authorizing Provider  aspirin 81 MG tablet Take 81 mg by mouth daily.   Yes [provider]  atorvastatin  (LIPITOR) 20 MG tablet Take 20 mg by mouth daily.   Yes [provider]  cetirizine (ZYRTEC) 10 MG tablet Take 10 mg by mouth daily.   Yes [provider]  escitalopram (LEXAPRO) 10 MG tablet Take 0.5 tablets (5 mg total) by mouth daily. 06/28/16  Yes Birdie Sons, MD  ibuprofen (ADVIL,MOTRIN) 800 MG tablet Take 1 tablet (800 mg total) by mouth every 8 (eight) hours as needed. 12/31/15  Yes Mar Daring, PA-C  ondansetron (ZOFRAN ODT) 4 MG disintegrating tablet Take 1 tablet (4 mg total) by mouth every 8 (eight) hours as needed for nausea or vomiting. 01/28/17  Yes Harvest Dark, MD  oxyCODONE-acetaminophen (ROXICET) 5-325 MG tablet Take 1 tablet by mouth every 6 (six) hours as needed. 01/28/17  Yes Harvest Dark, MD  tamsulosin (FLOMAX) 0.4 MG CAPS capsule Take 1 capsule (0.4 mg total) by mouth daily. 01/28/17  Yes Harvest Dark, MD    Allergies Patient has no known allergies.  Family History  Problem Relation Age of Onset  . Cancer Mother 72    Breast  . Kidney Stones Mother   . Diabetes Father   . Hypertension Father   . Breast cancer Maternal Grandmother   . Kidney Stones Other   . Lymphoma Maternal Grandfather     Social History Social History  Substance Use Topics  . Smoking status: Never Smoker  .  Smokeless tobacco: Never Used  . Alcohol use 2.4 oz/week    4 Cans of beer per week     Comment: occasional use    Review of Systems  Constitutional: No fever/chills Eyes: No visual changes. ENT: No sore throat. Cardiovascular: Denies chest pain. Respiratory: Denies shortness of breath. Gastrointestinal: No abdominal pain.   No diarrhea.  No constipation. Genitourinary: Negative for dysuria. Musculoskeletal: as above Skin: Negative for rash. Neurological: Negative for headaches, focal weakness or numbness.   ____________________________________________   PHYSICAL EXAM:  VITAL SIGNS: ED Triage Vitals [02/01/17 2248]  Enc Vitals  Group     BP (!) 172/104     Pulse Rate (!) 121     Resp 20     Temp 98.8 F (37.1 C)     Temp Source Oral     SpO2 98 %     Weight 245 lb (111.1 kg)     Height 5' 10"  (1.778 m)     Head Circumference      Peak Flow      Pain Score 10     Pain Loc      Pain Edu?      Excl. in [place]?     Constitutional: Alert and oriented. Well appearing and in no acute distress. Eyes: Conjunctivae are normal. PERRL. EOMI. Head: Atraumatic. Nose: No congestion/rhinnorhea. Mouth/Throat: Mucous membranes are moist.   Neck: No stridor.   Cardiovascular: Normal rate, regular rhythm. Grossly normal heart sounds.   Respiratory: Normal respiratory effort.  No retractions. Lungs CTAB. Gastrointestinal: Soft and nontender. No distention. Mild right-sided CVA tenderness to palpation. Musculoskeletal: No lower extremity tenderness nor edema.  No joint effusions. Neurologic:  Normal speech and language. No gross focal neurologic deficits are appreciated. No gait instability. Skin:  Skin is warm, dry and intact. No rash noted. Psychiatric: Mood and affect are normal. Speech and behavior are normal.  ____________________________________________   LABS (all labs ordered are listed, but only abnormal results are displayed)  Labs Reviewed  URINALYSIS, COMPLETE (UACMP) WITH MICROSCOPIC - Abnormal; Notable for the following:       Result Value   Color, Urine YELLOW (*)    APPearance CLEAR (*)    Hgb urine dipstick SMALL (*)    All other components within normal limits  BASIC METABOLIC PANEL - Abnormal; Notable for the following:    Glucose, Bld 118 (*)    Creatinine, Ser 1.64 (*)    GFR calc non Af Amer 50 (*)    GFR calc Af Amer 58 (*)    All other components within normal limits  CBC - Abnormal; Notable for the following:    WBC 16.8 (*)    All other components within normal limits    ____________________________________________  EKG   ____________________________________________  RADIOLOGY   ____________________________________________   PROCEDURES  Procedure(s) performed:   Procedures  Critical Care performed:   ____________________________________________   INITIAL IMPRESSION / ASSESSMENT AND PLAN / ED COURSE  Pertinent labs & imaging results that were available during my care of the patient were reviewed by me and considered in my medical decision making (see chart for details).  ----------------------------------------- 12:01 AM on 02/02/2017 -----------------------------------------  Patient with increased creatinine 1.64. White blood cell count decreased to 16.8. I discussed the case Dr. Erlene Quan. She says as long as we can give his pain under control that he may follow-up at 8:30 AM tomorrow for his lithotripsy. At this time the patient says that he has had building  pressure to his right flank. We will try an additional 2 Percocet and reassess.    ----------------------------------------- 12:45 AM on 02/02/2017 -----------------------------------------  After additional Percocet the patient is resting comfortably this time. He has no point with his hand this time. He'll be discharged to home. We discussed taking 2 more Percocets 4 hours from now if the pain returns. Otherwise, he'll be following up at 8:30 AM and will be nothing by mouth other than the Percocet for his procedure. He is understanding of this plan and willing to comply.  ____________________________________________   FINAL CLINICAL IMPRESSION(S) / ED DIAGNOSES  Kidney stone.    NEW MEDICATIONS STARTED DURING THIS VISIT:  New Prescriptions   No medications on file     Note:  This document was prepared using Dragon voice recognition software and may include unintentional dictation errors.    Orbie Pyo, MD 02/02/17 [phone]

## 2017-02-02 ENCOUNTER — Ambulatory Visit: Payer: BLUE CROSS/BLUE SHIELD

## 2017-02-02 ENCOUNTER — Encounter
Admission: RE | Disposition: A | Discharge status: Home / Self Care | Payer: Self-pay | Source: Ambulatory Visit | Attending: Urology

## 2017-02-02 ENCOUNTER — Ambulatory Visit
Admission: RE | Admit: 2017-02-02 | Discharge: 2017-02-02 | Disposition: A | Discharge status: Home / Self Care | Payer: BLUE CROSS/BLUE SHIELD | Source: Ambulatory Visit | Attending: Urology | Admitting: Urology

## 2017-02-02 ENCOUNTER — Encounter: Payer: Self-pay | Admitting: *Deleted

## 2017-02-02 DIAGNOSIS — Z7982 Long term (current) use of aspirin: Secondary | ICD-10-CM | POA: Insufficient documentation

## 2017-02-02 DIAGNOSIS — N201 Calculus of ureter: Secondary | ICD-10-CM | POA: Diagnosis not present

## 2017-02-02 DIAGNOSIS — F419 Anxiety disorder, unspecified: Secondary | ICD-10-CM | POA: Insufficient documentation

## 2017-02-02 DIAGNOSIS — Z79899 Other long term (current) drug therapy: Secondary | ICD-10-CM | POA: Insufficient documentation

## 2017-02-02 DIAGNOSIS — N202 Calculus of kidney with calculus of ureter: Secondary | ICD-10-CM | POA: Diagnosis not present

## 2017-02-02 DIAGNOSIS — K219 Gastro-esophageal reflux disease without esophagitis: Secondary | ICD-10-CM | POA: Insufficient documentation

## 2017-02-02 DIAGNOSIS — G4730 Sleep apnea, unspecified: Secondary | ICD-10-CM | POA: Insufficient documentation

## 2017-02-02 HISTORY — PX: EXTRACORPOREAL SHOCK WAVE LITHOTRIPSY: SHX1557

## 2017-02-02 SURGERY — LITHOTRIPSY, ESWL
Anesthesia: Moderate Sedation | Laterality: Right

## 2017-02-02 MED ORDER — HYDROCODONE-ACETAMINOPHEN 5-325 MG PO TABS: 1.0000 | ORAL_TABLET | Freq: Four times a day (QID) | ORAL | 0 refills | Status: DC | PRN

## 2017-02-02 MED ORDER — DEXTROSE-NACL 5-0.45 % IV SOLN
INTRAVENOUS | Status: DC
  Administered 2017-02-02: 09:00:00 via INTRAVENOUS

## 2017-02-02 MED ORDER — CIPROFLOXACIN HCL 500 MG PO TABS
ORAL_TABLET | ORAL | Status: AC
  Administered 2017-02-02: 500 mg via ORAL
  Filled 2017-02-02: qty 1

## 2017-02-02 MED ORDER — CIPROFLOXACIN HCL 500 MG PO TABS
500.0000 mg | ORAL_TABLET | ORAL | Status: AC
  Administered 2017-02-02: 500 mg via ORAL

## 2017-02-02 MED ORDER — DIAZEPAM 5 MG PO TABS
ORAL_TABLET | ORAL | Status: AC
  Administered 2017-02-02: 10 mg via ORAL
  Filled 2017-02-02: qty 2

## 2017-02-02 MED ORDER — MORPHINE SULFATE (PF) 4 MG/ML IV SOLN
INTRAVENOUS | Status: AC
Start: 2017-02-02 — End: 2017-02-02
  Administered 2017-02-02: 2 mg via INTRAVENOUS
  Filled 2017-02-02: qty 1

## 2017-02-02 MED ORDER — DIPHENHYDRAMINE HCL 25 MG PO CAPS
25.0000 mg | ORAL_CAPSULE | ORAL | Status: AC
  Administered 2017-02-02: 25 mg via ORAL

## 2017-02-02 MED ORDER — DIPHENHYDRAMINE HCL 25 MG PO CAPS
ORAL_CAPSULE | ORAL | Status: AC
  Administered 2017-02-02: 25 mg via ORAL
  Filled 2017-02-02: qty 1

## 2017-02-02 MED ORDER — MORPHINE SULFATE (PF) 4 MG/ML IV SOLN
2.0000 mg | Freq: Once | INTRAVENOUS | Status: AC
  Administered 2017-02-02: 2 mg via INTRAVENOUS

## 2017-02-02 MED ORDER — DIAZEPAM 5 MG PO TABS
10.0000 mg | ORAL_TABLET | ORAL | Status: AC
  Administered 2017-02-02: 10 mg via ORAL

## 2017-02-02 NOTE — Interval H&P Note (Signed)
History and Physical Interval Note:  02/02/2017 10:30 AM  Elijah Mora  has presented today for surgery, with the diagnosis of kidney stone  The various methods of treatment have been discussed with the patient and family. After consideration of risks, benefits and other options for treatment, the patient has consented to  Procedure(s): EXTRACORPOREAL SHOCK WAVE LITHOTRIPSY (ESWL) (Right) as a surgical intervention .  The patient's history has been reviewed, patient examined, no change in status, stable for surgery.  I have reviewed the patient's chart and labs.  Questions were answered to the patient's satisfaction.    RRR CTAB  Hollice Espy

## 2017-02-02 NOTE — H&P (View-Only) (Signed)
01/30/2017 1:53 PM   TALEN POSER 1971-05-04 [id number]  Referring provider: Birdie Sons, MD [address] [place], [place] [address]  Chief Complaint  Patient presents with  . Nephrolithiasis    New Patient    HPI: 46 year old male who presents today for further evaluation of a 7.5 mm right proximal ureteral stone. He was seen in the emergency room on Saturday with gross hematuria, right flank pain and nausea and vomiting.  At that point in time, he been having intermittent flank pain for about 5 days but had acute exacerbation of his pain.     In the emergency room, he was afebrile and hemodynamically stable. His hemoglobin was ultimately able to be controlled. He did have a presumed reactive leukocytosis to 17. His UA was negative other than for blood. Creatinine was mildly elevated to 1.1.  He continues to have intermittent mild pain. He's been taking Percocet as needed. No nausea or vomiting. No fevers or chills.  He does have a personal history of kidney stones, last spontaneously passed a stone about a year ago.  He has passed 8 stones in 22 year.  He has had URS x 1 and ESWL x1 (~10 years ago, effective).    He does take baby aspirin, last dose this AM at 6 am.    PMH: Past Medical History:  Diagnosis Date  . Anxiety   . GERD (gastroesophageal reflux disease)   . 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 Renal disorder    kidney stones  . Sleep apnea     Surgical History: Past Surgical History:  Procedure Laterality Date  . 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 Dr. Konrad Penta  . KIDNEY STONE SURGERY    . Synovial cancer     behind right knee. Off Chemo 06/1999    Home Medications:  Allergies as of 01/30/2017   No Known Allergies     Medication List       Accurate as of 01/30/17  1:53 PM. Always use your most recent med list.          aspirin 81 MG tablet Take 81 mg by  mouth daily.   atorvastatin 20 MG tablet Commonly known as:  LIPITOR Take 20 mg by mouth daily.   cetirizine 10 MG tablet Commonly known as:  ZYRTEC Take 10 mg by mouth daily.   escitalopram 10 MG tablet Commonly known as:  LEXAPRO Take 0.5 tablets (5 mg total) by mouth daily.   ibuprofen 800 MG tablet Commonly known as:  ADVIL,MOTRIN Take 1 tablet (800 mg total) by mouth every 8 (eight) hours as needed.   ondansetron 4 MG disintegrating tablet Commonly known as:  ZOFRAN ODT Take 1 tablet (4 mg total) by mouth every 8 (eight) hours as needed for nausea or vomiting.   oxyCODONE-acetaminophen 5-325 MG tablet Commonly known as:  ROXICET Take 1 tablet by mouth every 6 (six) hours as needed.   tamsulosin 0.4 MG Caps capsule Commonly known as:  FLOMAX Take 1 capsule (0.4 mg total) by mouth daily.       Allergies: No Known Allergies  Family History: Family History  Problem Relation Age of Onset  . Cancer Mother 46    Breast  . Kidney Stones Mother   . Diabetes Father   . Hypertension Father   . Breast cancer Maternal Grandmother   . Kidney Stones Other   . Lymphoma Maternal Grandfather  Social History:  reports that he has never smoked. He has never used smokeless tobacco. He reports that he drinks about 2.4 oz of alcohol per week . He reports that he does not use drugs.  ROS: UROLOGY Frequent Urination?: No Hard to postpone urination?: No Burning/pain with urination?: No Get up at night to urinate?: No Leakage of urine?: No Urine stream starts and stops?: No Trouble starting stream?: No Do you have to strain to urinate?: No Blood in urine?: Yes Urinary tract infection?: No Sexually transmitted disease?: No Injury to kidneys or bladder?: No Painful intercourse?: No Weak stream?: No Erection problems?: No Penile pain?: No  Gastrointestinal Nausea?: Yes Vomiting?: Yes Indigestion/heartburn?: No Diarrhea?: Yes Constipation?:  No  Constitutional Fever: No Night sweats?: No Weight loss?: No Fatigue?: No  Skin Skin rash/lesions?: No Itching?: No  Eyes Blurred vision?: No Double vision?: No  Ears/Nose/Throat Sore throat?: No Sinus problems?: No  Hematologic/Lymphatic Swollen glands?: No Easy bruising?: No  Cardiovascular Leg swelling?: No Chest pain?: No  Respiratory Cough?: No Shortness of breath?: No  Endocrine Excessive thirst?: No  Musculoskeletal Back pain?: Yes Joint pain?: No  Neurological Headaches?: No Dizziness?: No  Psychologic Depression?: No Anxiety?: Yes  Physical Exam: BP (!) 170/80   Pulse 92   Ht 5\' 10"  (1.778 m)   Wt 245 lb (111.1 kg)   BMI 35.15 kg/m   Constitutional:  Alert and oriented, No acute distress. HEENT: [place] AT, moist mucus membranes.  Trachea midline, no masses. Cardiovascular: No clubbing, cyanosis, or edema. Respiratory: Normal respiratory effort, no increased work of breathing. GI: Abdomen is soft, nontender, nondistended, no abdominal masses.  Obese.   GU: No CVA tenderness.  Skin: No rashes, bruises or suspicious lesions. Neurologic: Grossly intact, no focal deficits, moving all 4 extremities. Psychiatric: Normal mood and affect.  Laboratory Data: Lab Results  Component Value Date   WBC 17.4 (H) 01/28/2017   HGB 16.5 01/28/2017   HCT 49.2 01/28/2017   MCV 89.1 01/28/2017   PLT 266 01/28/2017    Lab Results  Component Value Date   CREATININE 1.14 01/28/2017    Urinalysis    Component Value Date/Time   COLORURINE STRAW (A) 01/28/2017 1235   APPEARANCEUR CLEAR (A) 01/28/2017 1235   APPEARANCEUR Clear 08/05/2013 0247   LABSPEC 1.014 01/28/2017 1235   LABSPEC 1.024 08/05/2013 0247   PHURINE 6.0 01/28/2017 1235   GLUCOSEU NEGATIVE 01/28/2017 1235   GLUCOSEU Negative 08/05/2013 0247   HGBUR LARGE (A) 01/28/2017 1235   BILIRUBINUR NEGATIVE 01/28/2017 1235   BILIRUBINUR negative 01/24/2017 1417   BILIRUBINUR Negative  08/05/2013 0247   KETONESUR NEGATIVE 01/28/2017 1235   PROTEINUR NEGATIVE 01/28/2017 1235   UROBILINOGEN 0.2 01/24/2017 1417   NITRITE NEGATIVE 01/28/2017 1235   LEUKOCYTESUR NEGATIVE 01/28/2017 1235   LEUKOCYTESUR Negative 08/05/2013 0247    Pertinent Imaging: CLINICAL DATA:  Right flank pain and hematuria.  EXAM: CT ABDOMEN AND PELVIS WITHOUT CONTRAST  TECHNIQUE: Multidetector CT imaging of the abdomen and pelvis was performed following the standard protocol without IV contrast.  COMPARISON:  None.  FINDINGS: Lower chest: No acute abnormality.  Hepatobiliary: No focal liver abnormality is seen. No gallstones, gallbladder wall thickening, or biliary dilatation.  Pancreas: Unremarkable. No pancreatic ductal dilatation or surrounding inflammatory changes.  Spleen: Normal in size without focal abnormality.  Adrenals/Urinary Tract: No stones are seen in either kidney. Hydronephrosis and perinephric stranding fall are seen on the right. An obstructing 7.5 mm stone is seen in the proximal right  ureter. The remainder of the right ureter is normal. The left kidney, left ureter, and bladder are normal.  Stomach/Bowel: The stomach and small bowel are normal. Mild fecal loading in the colon. The colon is otherwise normal. The appendix is normal.  Vascular/Lymphatic: No significant vascular findings are present. No enlarged abdominal or pelvic lymph nodes.  Reproductive: Prostate is unremarkable.  Other: No free air or free fluid.  Musculoskeletal: Sclerotic foci in the proximal femurs are likely bone islands. No acute bony abnormalities are identified.  IMPRESSION: 1. 7.5 mm obstructing stone in the proximal right ureter with mild hydronephrosis and perinephric stranding. 2. No other acute abnormalities.   Electronically Signed   By: Dorise Bullion III M.D   On: 01/28/2017 14:07  CT imaging was reviewed personally today and with the patient. The  stone to skin distance is proximally 17 cm. Hounsfield units 1080.    Assessment & Plan:    1. Right ureteral calculus 7.5 mm right proximal ureteral stone.  Although he was noted to have a leukocytosis, he has no other signs or symptoms of infection including a negative urinalysis and no fevers or chills. Options including medical expulsive therapy, ureteroscopy, and shockwave lithotripsy reviewed along with the risk and benefits of each.   We discussed the risks and benefits of both including bleeding, infection, damage to surrounding structures, efficacy with need for possible further intervention, and need for temporary ureteral stent.  Although he is not a perfect candidate for shockwave lithotripsy given the stone to skin distance of 17 cm, he is undergone this successfully in the past and would like to try this again. Based on his parameters, I estimated approximate 50% stone clearance rate with shockwave lithotripsy.  All of his questions were answered today. He is agreeable to plan.  Preop urine culture.    - CULTURE, URINE COMPREHENSIVE  2. Right flank pain Secondary to #1  Schedule right ESWL  Hollice Espy, MD  [facility] [address], [place], [place] [address] [phone]

## 2017-02-02 NOTE — OR Nursing (Signed)
On arrival noted softball size red area at right flank. Patient denies any pain at that site

## 2017-02-03 LAB — CULTURE, URINE COMPREHENSIVE

## 2017-02-06 ENCOUNTER — Telehealth: Payer: Self-pay | Admitting: Urology

## 2017-02-06 ENCOUNTER — Ambulatory Visit
Admission: RE | Admit: 2017-02-06 | Discharge: 2017-02-06 | Disposition: A | Discharge status: Home / Self Care | Payer: BLUE CROSS/BLUE SHIELD | Source: Ambulatory Visit | Attending: Urology | Admitting: Urology

## 2017-02-06 DIAGNOSIS — N200 Calculus of kidney: Secondary | ICD-10-CM | POA: Diagnosis not present

## 2017-02-06 DIAGNOSIS — R140 Abdominal distension (gaseous): Secondary | ICD-10-CM | POA: Diagnosis not present

## 2017-02-06 DIAGNOSIS — N201 Calculus of ureter: Secondary | ICD-10-CM

## 2017-02-06 DIAGNOSIS — N202 Calculus of kidney with calculus of ureter: Secondary | ICD-10-CM | POA: Diagnosis not present

## 2017-02-06 MED ORDER — OXYCODONE-ACETAMINOPHEN 5-325 MG PO TABS: 1.0000 | ORAL_TABLET | Freq: Four times a day (QID) | ORAL | 0 refills | Status: DC | PRN

## 2017-02-06 NOTE — Telephone Encounter (Signed)
Gave pt instructions per previous note. Patient verbalized understanding. He states he will have KUB done today. Requesting pain meds, says he is completely out. Advised pt per MD note, need KUB to review status. Pt requesting call back from Dr. Cherrie Gauze nurse in reference to why he is being denied pain meds. Explained to pt  his condition will need to assessed for further treatment, so that's why he is having imaging.  Pt insist I give Dr. Erlene Quan and nurse message that he is in pain, need pain meds, and refuses to go to ED.

## 2017-02-06 NOTE — Telephone Encounter (Signed)
Pt had ESWL last week and is almost out of pain meds.  He would like more.

## 2017-02-06 NOTE — Telephone Encounter (Signed)
02/06/17  KUB reviewed. The right ureteral stone is essentially unchanged status post as well. We discussed by telephone today that we will recommend ureteroscopic intervention for this stone. He would like to be booked for soon as possible which we'll try to accommodate. We'll plan to proceed with right ureteroscopy, laser lithotripsy, right ureteral stent placement on Friday with Dr. Pilar Jarvis.  Of note, the patient is frustrated this afternoon. He reports that he came by our office specifically for narcotics refill which was not communicated to me clearly (messages not forwarded properly to my inbasket) and therefore was not given a prescription when he came in today for his x-ray. Understandably, he is quite upset. I will leave a prescription for him at the front desk and he will try to stop by this evening versus back up first thing this morning to pick up his prescription.  I did apologize for our miscommunication today and for his frustration.  Hollice Espy, MD

## 2017-02-06 NOTE — Telephone Encounter (Signed)
Please advise 

## 2017-02-06 NOTE — Telephone Encounter (Signed)
Pt also called stating ESWL was unsuccessful & would like to know when he can be scheduled for ureteroscopy. He has a 2 week f/u with KUB scheduled 02/21/17 with Larene Beach. Please advise.

## 2017-02-06 NOTE — Telephone Encounter (Signed)
Left have him get a KUB today and see where we stand. Technically, we don't know that shockwave wasn't unsuccessful yet.  Hollice Espy, MD

## 2017-02-07 ENCOUNTER — Other Ambulatory Visit: Payer: Self-pay | Admitting: Radiology

## 2017-02-07 ENCOUNTER — Telehealth: Payer: Self-pay | Admitting: Radiology

## 2017-02-07 DIAGNOSIS — N201 Calculus of ureter: Secondary | ICD-10-CM

## 2017-02-07 NOTE — Telephone Encounter (Signed)
Notified pt of surgery scheduled with Dr Pilar Jarvis on 02/10/17, pre-admit testing phone interview & to call day prior to surgery for arrival time to SDS. Advised pt to hold ASA 81mg  & ibuprofen beginning today, however, pt states he has not resumed either medication since having ESWL on 02/02/17. Pt voices understanding.  Apologized to pt for miscommunication yesterday regarding pain medication. Pt states he appreciates the gesture & that he was able to pick up script yesterday.

## 2017-02-08 ENCOUNTER — Encounter
Admission: RE | Admit: 2017-02-08 | Discharge: 2017-02-08 | Disposition: A | Discharge status: Home / Self Care | Payer: BLUE CROSS/BLUE SHIELD | Source: Ambulatory Visit | Attending: Urology | Admitting: Urology

## 2017-02-08 HISTORY — DX: Personal history of urinary calculi: Z87.442

## 2017-02-08 NOTE — Patient Instructions (Signed)
  Your procedure is scheduled on: 02-10-17 Report to Same Day Surgery 2nd floor medical mall [facility] Entrance-take elevator on left to 2nd floor.  Check in with surgery information desk.) To find out your arrival time please call [phone] between 1PM - 3PM on 02-09-17  Remember: Instructions that are not followed completely may result in serious medical risk, up to and including death, or upon the discretion of your surgeon and anesthesiologist your surgery may need to be rescheduled.    _x___ 1. Do not eat food or drink liquids after midnight. No gum chewing or                              hard candies.     __x__ 2. No Alcohol for 24 hours before or after surgery.   __x__3. No Smoking for 24 prior to surgery.   ____  4. Bring all medications with you on the day of surgery if instructed.    __x__ 5. Notify your doctor if there is any change in your medical condition     (cold, fever, infections).     Do not wear jewelry, make-up, hairpins, clips or nail polish.  Do not wear lotions, powders, or perfumes. You may wear deodorant.  Do not shave 48 hours prior to surgery. Men may shave face and neck.  Do not bring valuables to the hospital.    [facility] is not responsible for any belongings or valuables.               Contacts, dentures or bridgework may not be worn into surgery.  Leave your suitcase in the car. After surgery it may be brought to your room.  For patients admitted to the hospital, discharge time is determined by your                       treatment team.   Patients discharged the day of surgery will not be allowed to drive home.  You will need someone to drive you home and stay with you the night of your procedure.    Please read over the following fact sheets that you were given:   [facility] Preparing for Surgery and or MRSA Information   _x___ Take anti-hypertensive (unless it includes a diuretic), cardiac, seizure, asthma,     anti-reflux and psychiatric  medicines. These include:  1. FLOMAX  2. MAY TAKE OXYCODONE IF NEEDED AM OF SURGERY WITH A SMALL SIP OF WATER  3.  4.  5.  6.  ____Fleets enema or Magnesium Citrate as directed.   ____ Use CHG Soap or sage wipes as directed on instruction sheet   ____ Use inhalers on the day of surgery and bring to hospital day of surgery  ____ Stop Metformin and Janumet 2 days prior to surgery.    ____ Take 1/2 of usual insulin dose the night before surgery and none on the morning     surgery.   ____ Follow recommendations from Cardiologist, Pulmonologist or PCP regarding stopping Aspirin, Coumadin, Pllavix ,Eliquis, Effient, or Pradaxa, and Pletal-PT STATES HE NEVER RESUMED HIS ASA SINCE HIS LITHOTRIPSY LAST MONDAY  X____Stop Anti-inflammatories such as Advil, Aleve, Ibuprofen, Motrin, Naproxen, Naprosyn, Goodies powders or aspirin products NOW-OK to take Tylenol OR OXYCODONE IFNEEDED   ____ Stop supplements until after surgery.    _X___ Bring C-Pap to the hospital.

## 2017-02-09 MED ORDER — CEFAZOLIN SODIUM-DEXTROSE 2-4 GM/100ML-% IV SOLN
2.0000 g | INTRAVENOUS | Status: AC
  Administered 2017-02-10: 2 g via INTRAVENOUS

## 2017-02-10 ENCOUNTER — Ambulatory Visit
Admission: RE | Admit: 2017-02-10 | Discharge: 2017-02-10 | Disposition: A | Discharge status: Home / Self Care | Payer: BLUE CROSS/BLUE SHIELD | Source: Ambulatory Visit | Attending: Urology | Admitting: Urology

## 2017-02-10 ENCOUNTER — Ambulatory Visit: Payer: BLUE CROSS/BLUE SHIELD | Admitting: Anesthesiology

## 2017-02-10 ENCOUNTER — Telehealth: Payer: Self-pay | Admitting: Urology

## 2017-02-10 ENCOUNTER — Encounter
Admission: RE | Disposition: A | Discharge status: Home / Self Care | Payer: Self-pay | Source: Ambulatory Visit | Attending: Urology

## 2017-02-10 ENCOUNTER — Encounter: Payer: Self-pay | Admitting: *Deleted

## 2017-02-10 DIAGNOSIS — N132 Hydronephrosis with renal and ureteral calculous obstruction: Secondary | ICD-10-CM | POA: Insufficient documentation

## 2017-02-10 DIAGNOSIS — F419 Anxiety disorder, unspecified: Secondary | ICD-10-CM | POA: Insufficient documentation

## 2017-02-10 DIAGNOSIS — Z85830 Personal history of malignant neoplasm of bone: Secondary | ICD-10-CM | POA: Insufficient documentation

## 2017-02-10 DIAGNOSIS — K219 Gastro-esophageal reflux disease without esophagitis: Secondary | ICD-10-CM | POA: Insufficient documentation

## 2017-02-10 DIAGNOSIS — G4730 Sleep apnea, unspecified: Secondary | ICD-10-CM | POA: Insufficient documentation

## 2017-02-10 DIAGNOSIS — N201 Calculus of ureter: Secondary | ICD-10-CM

## 2017-02-10 DIAGNOSIS — Z87442 Personal history of urinary calculi: Secondary | ICD-10-CM | POA: Insufficient documentation

## 2017-02-10 DIAGNOSIS — Z7982 Long term (current) use of aspirin: Secondary | ICD-10-CM | POA: Diagnosis not present

## 2017-02-10 DIAGNOSIS — Z79899 Other long term (current) drug therapy: Secondary | ICD-10-CM | POA: Insufficient documentation

## 2017-02-10 DIAGNOSIS — Z9221 Personal history of antineoplastic chemotherapy: Secondary | ICD-10-CM | POA: Insufficient documentation

## 2017-02-10 HISTORY — PX: URETEROSCOPY WITH HOLMIUM LASER LITHOTRIPSY: SHX6645

## 2017-02-10 HISTORY — PX: CYSTOSCOPY WITH STENT PLACEMENT: SHX5790

## 2017-02-10 SURGERY — URETEROSCOPY, WITH LITHOTRIPSY USING HOLMIUM LASER
Anesthesia: General | Laterality: Right

## 2017-02-10 MED ORDER — SUCCINYLCHOLINE CHLORIDE 20 MG/ML IJ SOLN
INTRAMUSCULAR | Status: AC
  Filled 2017-02-10: qty 1

## 2017-02-10 MED ORDER — FENTANYL CITRATE (PF) 100 MCG/2ML IJ SOLN
INTRAMUSCULAR | Status: AC
  Filled 2017-02-10: qty 2

## 2017-02-10 MED ORDER — LIDOCAINE HCL (CARDIAC) 20 MG/ML IV SOLN
INTRAVENOUS | Status: DC | PRN
  Administered 2017-02-10: 100 mg via INTRAVENOUS

## 2017-02-10 MED ORDER — CEPHALEXIN 500 MG PO CAPS: 500.0000 mg | ORAL_CAPSULE | Freq: Three times a day (TID) | ORAL | 0 refills | Status: DC

## 2017-02-10 MED ORDER — LIDOCAINE HCL (PF) 2 % IJ SOLN
INTRAMUSCULAR | Status: AC
  Filled 2017-02-10: qty 2

## 2017-02-10 MED ORDER — FAMOTIDINE 20 MG PO TABS
ORAL_TABLET | ORAL | Status: AC
Start: 2017-02-10 — End: 2017-02-10
  Administered 2017-02-10: 20 mg via ORAL
  Filled 2017-02-10: qty 1

## 2017-02-10 MED ORDER — MIDAZOLAM HCL 2 MG/2ML IJ SOLN
INTRAMUSCULAR | Status: DC | PRN
  Administered 2017-02-10: 2 mg via INTRAVENOUS

## 2017-02-10 MED ORDER — PROMETHAZINE HCL 25 MG/ML IJ SOLN: 6.2500 mg | INTRAMUSCULAR | Status: DC | PRN

## 2017-02-10 MED ORDER — DEXAMETHASONE SODIUM PHOSPHATE 10 MG/ML IJ SOLN
INTRAMUSCULAR | Status: DC | PRN
  Administered 2017-02-10: 10 mg via INTRAVENOUS

## 2017-02-10 MED ORDER — ONDANSETRON HCL 4 MG/2ML IJ SOLN
INTRAMUSCULAR | Status: DC | PRN
  Administered 2017-02-10: 4 mg via INTRAVENOUS

## 2017-02-10 MED ORDER — FENTANYL CITRATE (PF) 100 MCG/2ML IJ SOLN
INTRAMUSCULAR | Status: AC
  Administered 2017-02-10: 50 ug via INTRAVENOUS
  Filled 2017-02-10: qty 2

## 2017-02-10 MED ORDER — FAMOTIDINE 20 MG PO TABS
20.0000 mg | ORAL_TABLET | Freq: Once | ORAL | Status: AC
  Administered 2017-02-10: 20 mg via ORAL

## 2017-02-10 MED ORDER — ONDANSETRON HCL 4 MG/2ML IJ SOLN
INTRAMUSCULAR | Status: AC
  Filled 2017-02-10: qty 2

## 2017-02-10 MED ORDER — MIDAZOLAM HCL 2 MG/2ML IJ SOLN
INTRAMUSCULAR | Status: AC
  Filled 2017-02-10: qty 2

## 2017-02-10 MED ORDER — DEXAMETHASONE SODIUM PHOSPHATE 10 MG/ML IJ SOLN
INTRAMUSCULAR | Status: AC
  Filled 2017-02-10: qty 1

## 2017-02-10 MED ORDER — LACTATED RINGERS IV SOLN
INTRAVENOUS | Status: DC
  Administered 2017-02-10: 11:00:00 via INTRAVENOUS

## 2017-02-10 MED ORDER — OXYCODONE-ACETAMINOPHEN 5-325 MG PO TABS: 1.0000 | ORAL_TABLET | ORAL | 0 refills | Status: DC | PRN

## 2017-02-10 MED ORDER — ROCURONIUM BROMIDE 50 MG/5ML IV SOLN
INTRAVENOUS | Status: AC
  Filled 2017-02-10: qty 1

## 2017-02-10 MED ORDER — IOTHALAMATE MEGLUMINE 43 % IV SOLN
INTRAVENOUS | Status: DC | PRN
  Administered 2017-02-10: 15 mL via URETHRAL

## 2017-02-10 MED ORDER — FENTANYL CITRATE (PF) 100 MCG/2ML IJ SOLN
25.0000 ug | INTRAMUSCULAR | Status: DC | PRN
  Administered 2017-02-10 (×3): 50 ug via INTRAVENOUS

## 2017-02-10 MED ORDER — FENTANYL CITRATE (PF) 100 MCG/2ML IJ SOLN
INTRAMUSCULAR | Status: DC | PRN
  Administered 2017-02-10 (×2): 50 ug via INTRAVENOUS
  Administered 2017-02-10: 100 ug via INTRAVENOUS

## 2017-02-10 MED ORDER — PROPOFOL 10 MG/ML IV BOLUS
INTRAVENOUS | Status: DC | PRN
  Administered 2017-02-10: 200 mg via INTRAVENOUS

## 2017-02-10 MED ORDER — CEFAZOLIN SODIUM-DEXTROSE 2-4 GM/100ML-% IV SOLN
INTRAVENOUS | Status: AC
  Filled 2017-02-10: qty 100

## 2017-02-10 MED ORDER — PROPOFOL 10 MG/ML IV BOLUS
INTRAVENOUS | Status: AC
  Filled 2017-02-10: qty 20

## 2017-02-10 SURGICAL SUPPLY — 28 items
BACTOSHIELD CHG 4% 4OZ (MISCELLANEOUS) ×1
BASKET ZERO TIP 1.9FR (BASKET) ×4 IMPLANT
CATH URETL 5X70 OPEN END (CATHETERS) ×2 IMPLANT
CNTNR SPEC 2.5X3XGRAD LEK (MISCELLANEOUS) ×1
CONT SPEC 4OZ STER OR WHT (MISCELLANEOUS) ×1
CONTAINER SPEC 2.5X3XGRAD LEK (MISCELLANEOUS) ×1 IMPLANT
FIBER LASER LITHO 273 (Laser) ×2 IMPLANT
GLOVE BIO SURGEON STRL SZ7 (GLOVE) ×2 IMPLANT
GLOVE BIO SURGEON STRL SZ7.5 (GLOVE) ×2 IMPLANT
GOWN STRL REUS W/ TWL LRG LVL4 (GOWN DISPOSABLE) ×1 IMPLANT
GOWN STRL REUS W/TWL LRG LVL4 (GOWN DISPOSABLE) ×1
GOWN STRL REUS W/TWL XL LVL3 (GOWN DISPOSABLE) ×2 IMPLANT
GUIDEWIRE SUPER STIFF (WIRE) IMPLANT
INTRODUCER DILATOR DOUBLE (INTRODUCER) ×2 IMPLANT
KIT RM TURNOVER CYSTO AR (KITS) ×2 IMPLANT
PACK CYSTO AR (MISCELLANEOUS) ×2 IMPLANT
SCRUB CHG 4% DYNA-HEX 4OZ (MISCELLANEOUS) ×1 IMPLANT
SENSORWIRE 0.038 NOT ANGLED (WIRE) ×2
SET CYSTO W/LG BORE CLAMP LF (SET/KITS/TRAYS/PACK) ×2 IMPLANT
SHEATH URETERAL 13/15X36 1L (SHEATH) IMPLANT
SOL .9 NS 3000ML IRR  AL (IV SOLUTION) ×1
SOL .9 NS 3000ML IRR UROMATIC (IV SOLUTION) ×1 IMPLANT
STENT URET 6FRX24 CONTOUR (STENTS) IMPLANT
STENT URET 6FRX26 CONTOUR (STENTS) ×2 IMPLANT
SURGILUBE 2OZ TUBE FLIPTOP (MISCELLANEOUS) ×2 IMPLANT
SYRINGE IRR TOOMEY STRL 70CC (SYRINGE) ×2 IMPLANT
WATER STERILE IRR 1000ML POUR (IV SOLUTION) ×2 IMPLANT
WIRE SENSOR 0.038 NOT ANGLED (WIRE) ×1 IMPLANT

## 2017-02-10 NOTE — Anesthesia Postprocedure Evaluation (Signed)
Anesthesia Post Note  Patient: Elijah Mora  Procedure(s) Performed: Procedure(s) (LRB): URETEROSCOPY WITH HOLMIUM LASER LITHOTRIPSY (Right) CYSTOSCOPY WITH STENT PLACEMENT (Right)  Patient location during evaluation: PACU Anesthesia Type: General Level of consciousness: awake and alert Pain management: pain level controlled Vital Signs Assessment: post-procedure vital signs reviewed and stable Respiratory status: spontaneous breathing, nonlabored ventilation, respiratory function stable and patient connected to nasal cannula oxygen Cardiovascular status: blood pressure returned to baseline and stable Postop Assessment: no signs of nausea or vomiting Anesthetic complications: no     Last Vitals:  Vitals:   02/10/17 1352 02/10/17 1417  BP: (!) 154/90 (!) 148/99  Pulse: 88 88  Resp: 20 18  Temp: 36.3 C 36.3 C    Last Pain:  Vitals:   02/10/17 1417  TempSrc: Temporal  PainSc: 2                  Martha Clan

## 2017-02-10 NOTE — Discharge Instructions (Signed)
AMBULATORY SURGERY  DISCHARGE INSTRUCTIONS   1) The drugs that you were given will stay in your system until tomorrow so for the next 24 hours you should not:  A) Drive an automobile B) Make any legal decisions C) Drink any alcoholic beverage   2) You may resume regular meals tomorrow.  Today it is better to start with liquids and gradually work up to solid foods.  You may eat anything you prefer, but it is better to start with liquids, then soup and crackers, and gradually work up to solid foods.   3) Please notify your doctor immediately if you have any unusual bleeding, trouble breathing, redness and pain at the surgery site, drainage, fever, or pain not relieved by medication.    4) Additional Instructions: Instructions per Dr Pilar Jarvis that were given to you prior to procedure.   Drink plenty of water, avoid soft drinks, strain all urine.  May have some blood tinged urine.  Called MD if you have severe pain, heaving bleeding, foul smelling urine or fever of 100.4.          Please contact your physician with any problems or Same Day Surgery at [phone], Monday through Friday 6 am to 4 pm, or [place] [facility] [address] number at [phone].

## 2017-02-10 NOTE — Op Note (Signed)
Date of procedure: 02/10/17  Preoperative diagnosis:  1. Right ureteral stone   Postoperative diagnosis:  1. Right ureteral stone   Procedure: 1. Cystoscopy 2. Right ureteroscopy 3. Laser lithotripsy 4. Stone basketing 5. Right retrograde polygrams interpretation 6. Right ureteral stent placement 6 French by 26 cm  Surgeon: Baruch Gouty, MD  Anesthesia: General  Complications: None  Intraoperative findings: The patient had a few small fragments in his distal right ureter that were removed with the stone basket. He had a larger fragment in the mid ureter that was broken into smaller pieces with laser lithotripsy removed with a stone basket. All stones were removed. This was confirmed with a right retrograde pyelogram at the end of the procedure which showed no filling defects.  EBL: None  Specimens: Right ureteral stone to pathology  Drains: Right 6 French by 26 cm double-J ureteral stent  Disposition: Stable to the postanesthesia care unit  Indication for procedure: The patient is a 46 y.o. male with recurrent nephrolithiasis who underwent a right lithotripsy for a 9 mm mid right ureteral stone that did not excessively clear the stone. He presents today for definitive management endoscopically.  After reviewing the management options for treatment, the patient elected to proceed with the above surgical procedure(s). We have discussed the potential benefits and risks of the procedure, side effects of the proposed treatment, the likelihood of the patient achieving the goals of the procedure, and any potential problems that might occur during the procedure or recuperation. Informed consent has been obtained.  Description of procedure: The patient was met in the preoperative area. All risks, benefits, and indications of the procedure were described in great detail. The patient consented to the procedure. Preoperative antibiotics were given. The patient was taken to the operative  theater. General anesthesia was induced per the anesthesia service. The patient was then placed in the dorsal lithotomy position and prepped and draped in the usual sterile fashion. A preoperative timeout was called.   A 21 French 30 cystoscope was inserted into the patient's bladder per urethra atraumatically. The right ureteral orifice was visualized and a sensor wire was placed up to level of the right renal pelvis under fluoroscopy. A semirigid ureteroscope was inserted into the patient's right ureteral orifice were number of small stones were seen. These were individually stone basket and removed. Pan ureteroscopy then revealed a stone in the mid ureter that was too big to remove intact. It was broken into smaller fragments with laser lithotripsy. These fragments were then removed individually. Pan ureteroscopy this point revealed no further stones. This was confirmed a right retrograde polygrams showed no filling defects. At this point the cystoscope was reassembled over the sensor wire and a 6 [place] by 26 cm double-J ureteral stent was placed. The sensor wire was removed. A curl seen in the patient's right renal pelvis on fluoroscopy and in the urinary bladder direct visualization. At this point the patient's bladder was drained. His wound from anesthesia and transferred in stable condition to the post anesthesia care unit.  Plan: The patient will follow-up in one to two weeks for cystoscopy and stent removal in the office. He will need a renal ultrasound in 1 month to rule out iatrogenic hydronephrosis. He may benefit from a 24 hour urine study as he had 8 stones in his lifetime.  Baruch Gouty, M.D.

## 2017-02-10 NOTE — Anesthesia Preprocedure Evaluation (Signed)
Anesthesia Evaluation  Patient identified by MRN, date of birth, ID band Patient awake    Reviewed: Allergy & Precautions, H&P , NPO status , Patient's Chart, lab work & pertinent test results, reviewed documented beta blocker date and time   History of Anesthesia Complications Negative for: history of anesthetic complications  Airway Mallampati: III  TM Distance: >3 FB Neck ROM: full    Dental  (+) Teeth Intact, Dental Advidsory Given   Pulmonary neg shortness of breath, sleep apnea and Continuous Positive Airway Pressure Ventilation , neg COPD, neg recent URI,           Cardiovascular Exercise Tolerance: Good (-) hypertension(-) angina+ Peripheral Vascular Disease (fem-pop bypass in the right s/p radiation tx)  (-) CAD, (-) Past MI, (-) Cardiac Stents and (-) CABG (-) dysrhythmias (-) Valvular Problems/Murmurs     Neuro/Psych negative neurological ROS  negative psych ROS   GI/Hepatic Neg liver ROS, GERD  ,  Endo/Other  negative endocrine ROS  Renal/GU Renal disease (kidney stones)  negative genitourinary   Musculoskeletal   Abdominal   Peds  Hematology negative hematology ROS (+)   Anesthesia Other Findings Past Medical History: No date: Anxiety No date: Cancer ([place])     Comment: SYNOVIAL CARCINOMA No date: GERD (gastroesophageal reflux disease)     Comment: OCC No date: History of kidney stones 2000: History of osteosarcoma     Comment: Right posterior leg No date: Sleep apnea     Comment: CPAP   Reproductive/Obstetrics negative OB ROS                             Anesthesia Physical Anesthesia Plan  ASA: II  Anesthesia Plan: General   Post-op Pain Management:    Induction:   Airway Management Planned:   Additional Equipment:   Intra-op Plan:   Post-operative Plan:   Informed Consent: I have reviewed the patients History and Physical, chart, labs and discussed the  procedure including the risks, benefits and alternatives for the proposed anesthesia with the patient or authorized representative who has indicated his/her understanding and acceptance.   Dental Advisory Given  Plan Discussed with: Anesthesiologist, CRNA and Surgeon  Anesthesia Plan Comments:         Anesthesia Quick Evaluation

## 2017-02-10 NOTE — Transfer of Care (Signed)
Immediate Anesthesia Transfer of Care Note  Patient: Elijah Mora  Procedure(s) Performed: Procedure(s): URETEROSCOPY WITH HOLMIUM LASER LITHOTRIPSY (Right) CYSTOSCOPY WITH STENT PLACEMENT (Right)  Patient Location: PACU  Anesthesia Type:General  Level of Consciousness: sedated  Airway & Oxygen Therapy: Patient Spontanous Breathing and Patient connected to face mask oxygen  Post-op Assessment: Report given to RN and Post -op Vital signs reviewed and stable  Post vital signs: Reviewed and stable  Last Vitals:  Vitals:   02/10/17 1113  BP: (!) 146/84  Pulse: 89  Resp: 18  Temp: 36.8 C    Last Pain:  Vitals:   02/10/17 1113  TempSrc: Oral  PainSc: 4          Complications: No apparent anesthesia complications

## 2017-02-10 NOTE — Anesthesia Post-op Follow-up Note (Cosign Needed)
Anesthesia QCDR form completed.        

## 2017-02-10 NOTE — Telephone Encounter (Signed)
-----   Message from Nickie Retort, MD sent at 02/10/2017  1:03 PM EDT ----- Patient needs to see me for cysto/stent removal in one week ideally if possible. If not it will have to be the following week. Patient's family was adamant that I be the one to remove it, so we can't place with another doctor. I can help you find a time this afternoon if there is no where easy to put him. thanks

## 2017-02-10 NOTE — Anesthesia Procedure Notes (Signed)
Procedure Name: LMA Insertion Date/Time: 02/10/2017 12:20 PM Performed by: Nelda Marseille Pre-anesthesia Checklist: Patient identified, Patient being monitored, Timeout performed, Emergency Drugs available and Suction available Patient Re-evaluated:Patient Re-evaluated prior to inductionOxygen Delivery Method: Circle system utilized Preoxygenation: Pre-oxygenation with 100% oxygen Intubation Type: IV induction Ventilation: Mask ventilation without difficulty LMA: LMA inserted LMA Size: 4.0 Tube type: Oral Number of attempts: 1 Placement Confirmation: positive ETCO2 and breath sounds checked- equal and bilateral Tube secured with: Tape Dental Injury: Teeth and Oropharynx as per pre-operative assessment

## 2017-02-10 NOTE — H&P (View-Only) (Signed)
01/30/2017 1:53 PM   Elijah Mora 1970-10-20 [id number]  Referring provider: Birdie Sons, MD [address] [place], [place] [address]  Chief Complaint  Patient presents with  . Nephrolithiasis    New Patient    HPI: 46 year old male who presents today for further evaluation of a 7.5 mm right proximal ureteral stone. He was seen in the emergency room on Saturday with gross hematuria, right flank pain and nausea and vomiting.  At that point in time, he been having intermittent flank pain for about 5 days but had acute exacerbation of his pain.     In the emergency room, he was afebrile and hemodynamically stable. His hemoglobin was ultimately able to be controlled. He did have a presumed reactive leukocytosis to 17. His UA was negative other than for blood. Creatinine was mildly elevated to 1.1.  He continues to have intermittent mild pain. He's been taking Percocet as needed. No nausea or vomiting. No fevers or chills.  He does have a personal history of kidney stones, last spontaneously passed a stone about a year ago.  He has passed 8 stones in 22 year.  He has had URS x 1 and ESWL x1 (~10 years ago, effective).    He does take baby aspirin, last dose this AM at 6 am.    PMH: Past Medical History:  Diagnosis Date  . Anxiety   . GERD (gastroesophageal reflux disease)   . 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 Renal disorder    kidney stones  . Sleep apnea     Surgical History: Past Surgical History:  Procedure Laterality Date  . 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 Dr. Konrad Penta  . KIDNEY STONE SURGERY    . Synovial cancer     behind right knee. Off Chemo 06/1999    Home Medications:  Allergies as of 01/30/2017   No Known Allergies     Medication List       Accurate as of 01/30/17  1:53 PM. Always use your most recent med list.          aspirin 81 MG tablet Take 81 mg by  mouth daily.   atorvastatin 20 MG tablet Commonly known as:  LIPITOR Take 20 mg by mouth daily.   cetirizine 10 MG tablet Commonly known as:  ZYRTEC Take 10 mg by mouth daily.   escitalopram 10 MG tablet Commonly known as:  LEXAPRO Take 0.5 tablets (5 mg total) by mouth daily.   ibuprofen 800 MG tablet Commonly known as:  ADVIL,MOTRIN Take 1 tablet (800 mg total) by mouth every 8 (eight) hours as needed.   ondansetron 4 MG disintegrating tablet Commonly known as:  ZOFRAN ODT Take 1 tablet (4 mg total) by mouth every 8 (eight) hours as needed for nausea or vomiting.   oxyCODONE-acetaminophen 5-325 MG tablet Commonly known as:  ROXICET Take 1 tablet by mouth every 6 (six) hours as needed.   tamsulosin 0.4 MG Caps capsule Commonly known as:  FLOMAX Take 1 capsule (0.4 mg total) by mouth daily.       Allergies: No Known Allergies  Family History: Family History  Problem Relation Age of Onset  . Cancer Mother 40    Breast  . Kidney Stones Mother   . Diabetes Father   . Hypertension Father   . Breast cancer Maternal Grandmother   . Kidney Stones Other   . Lymphoma Maternal Grandfather  Social History:  reports that he has never smoked. He has never used smokeless tobacco. He reports that he drinks about 2.4 oz of alcohol per week . He reports that he does not use drugs.  ROS: UROLOGY Frequent Urination?: No Hard to postpone urination?: No Burning/pain with urination?: No Get up at night to urinate?: No Leakage of urine?: No Urine stream starts and stops?: No Trouble starting stream?: No Do you have to strain to urinate?: No Blood in urine?: Yes Urinary tract infection?: No Sexually transmitted disease?: No Injury to kidneys or bladder?: No Painful intercourse?: No Weak stream?: No Erection problems?: No Penile pain?: No  Gastrointestinal Nausea?: Yes Vomiting?: Yes Indigestion/heartburn?: No Diarrhea?: Yes Constipation?:  No  Constitutional Fever: No Night sweats?: No Weight loss?: No Fatigue?: No  Skin Skin rash/lesions?: No Itching?: No  Eyes Blurred vision?: No Double vision?: No  Ears/Nose/Throat Sore throat?: No Sinus problems?: No  Hematologic/Lymphatic Swollen glands?: No Easy bruising?: No  Cardiovascular Leg swelling?: No Chest pain?: No  Respiratory Cough?: No Shortness of breath?: No  Endocrine Excessive thirst?: No  Musculoskeletal Back pain?: Yes Joint pain?: No  Neurological Headaches?: No Dizziness?: No  Psychologic Depression?: No Anxiety?: Yes  Physical Exam: BP (!) 170/80   Pulse 92   Ht 5\' 10"  (1.778 m)   Wt 245 lb (111.1 kg)   BMI 35.15 kg/m   Constitutional:  Alert and oriented, No acute distress. HEENT: [place] AT, moist mucus membranes.  Trachea midline, no masses. Cardiovascular: No clubbing, cyanosis, or edema. Respiratory: Normal respiratory effort, no increased work of breathing. GI: Abdomen is soft, nontender, nondistended, no abdominal masses.  Obese.   GU: No CVA tenderness.  Skin: No rashes, bruises or suspicious lesions. Neurologic: Grossly intact, no focal deficits, moving all 4 extremities. Psychiatric: Normal mood and affect.  Laboratory Data: Lab Results  Component Value Date   WBC 17.4 (H) 01/28/2017   HGB 16.5 01/28/2017   HCT 49.2 01/28/2017   MCV 89.1 01/28/2017   PLT 266 01/28/2017    Lab Results  Component Value Date   CREATININE 1.14 01/28/2017    Urinalysis    Component Value Date/Time   COLORURINE STRAW (A) 01/28/2017 1235   APPEARANCEUR CLEAR (A) 01/28/2017 1235   APPEARANCEUR Clear 08/05/2013 0247   LABSPEC 1.014 01/28/2017 1235   LABSPEC 1.024 08/05/2013 0247   PHURINE 6.0 01/28/2017 1235   GLUCOSEU NEGATIVE 01/28/2017 1235   GLUCOSEU Negative 08/05/2013 0247   HGBUR LARGE (A) 01/28/2017 1235   BILIRUBINUR NEGATIVE 01/28/2017 1235   BILIRUBINUR negative 01/24/2017 1417   BILIRUBINUR Negative  08/05/2013 0247   KETONESUR NEGATIVE 01/28/2017 1235   PROTEINUR NEGATIVE 01/28/2017 1235   UROBILINOGEN 0.2 01/24/2017 1417   NITRITE NEGATIVE 01/28/2017 1235   LEUKOCYTESUR NEGATIVE 01/28/2017 1235   LEUKOCYTESUR Negative 08/05/2013 0247    Pertinent Imaging: CLINICAL DATA:  Right flank pain and hematuria.  EXAM: CT ABDOMEN AND PELVIS WITHOUT CONTRAST  TECHNIQUE: Multidetector CT imaging of the abdomen and pelvis was performed following the standard protocol without IV contrast.  COMPARISON:  None.  FINDINGS: Lower chest: No acute abnormality.  Hepatobiliary: No focal liver abnormality is seen. No gallstones, gallbladder wall thickening, or biliary dilatation.  Pancreas: Unremarkable. No pancreatic ductal dilatation or surrounding inflammatory changes.  Spleen: Normal in size without focal abnormality.  Adrenals/Urinary Tract: No stones are seen in either kidney. Hydronephrosis and perinephric stranding fall are seen on the right. An obstructing 7.5 mm stone is seen in the proximal right  ureter. The remainder of the right ureter is normal. The left kidney, left ureter, and bladder are normal.  Stomach/Bowel: The stomach and small bowel are normal. Mild fecal loading in the colon. The colon is otherwise normal. The appendix is normal.  Vascular/Lymphatic: No significant vascular findings are present. No enlarged abdominal or pelvic lymph nodes.  Reproductive: Prostate is unremarkable.  Other: No free air or free fluid.  Musculoskeletal: Sclerotic foci in the proximal femurs are likely bone islands. No acute bony abnormalities are identified.  IMPRESSION: 1. 7.5 mm obstructing stone in the proximal right ureter with mild hydronephrosis and perinephric stranding. 2. No other acute abnormalities.   Electronically Signed   By: Dorise Bullion III M.D   On: 01/28/2017 14:07  CT imaging was reviewed personally today and with the patient. The  stone to skin distance is proximally 17 cm. Hounsfield units 1080.    Assessment & Plan:    1. Right ureteral calculus 7.5 mm right proximal ureteral stone.  Although he was noted to have a leukocytosis, he has no other signs or symptoms of infection including a negative urinalysis and no fevers or chills. Options including medical expulsive therapy, ureteroscopy, and shockwave lithotripsy reviewed along with the risk and benefits of each.   We discussed the risks and benefits of both including bleeding, infection, damage to surrounding structures, efficacy with need for possible further intervention, and need for temporary ureteral stent.  Although he is not a perfect candidate for shockwave lithotripsy given the stone to skin distance of 17 cm, he is undergone this successfully in the past and would like to try this again. Based on his parameters, I estimated approximate 50% stone clearance rate with shockwave lithotripsy.  All of his questions were answered today. He is agreeable to plan.  Preop urine culture.    - CULTURE, URINE COMPREHENSIVE  2. Right flank pain Secondary to #1  Schedule right ESWL  Hollice Espy, MD  [facility] [address], [place], [place] [address] [phone]

## 2017-02-10 NOTE — Telephone Encounter (Signed)
done

## 2017-02-10 NOTE — OR Nursing (Signed)
Supplies sent home to collect and strain urine.  Patient able to void desires discharge.

## 2017-02-10 NOTE — Interval H&P Note (Signed)
History and Physical Interval Note:  02/10/2017 11:43 AM  Elijah Mora  has presented today for surgery, with the diagnosis of RIGHT URETERAL STONE  The various methods of treatment have been discussed with the patient and family. After consideration of risks, benefits and other options for treatment, the patient has consented to  Procedure(s): URETEROSCOPY WITH HOLMIUM LASER LITHOTRIPSY (Right) CYSTOSCOPY WITH STENT PLACEMENT (Right) as a surgical intervention .  The patient's history has been reviewed, patient examined, no change in status, stable for surgery.  I have reviewed the patient's chart and labs.  Questions were answered to the patient's satisfaction.     Patient presents today after undergoing lithotripsy with no change in stone after the procedure. He continues to have symptomatic pain. He presents today for endoscopic removal of his stone after failing lithotripsy. All risks, benefits, indications of the procedure were described in great detail. He understands the risks include but are not limited to bleeding, infection, iatrogenic injury, need for ureteral stent, and need for repeat procedures.  RRR Lungs clear  Elijah Mora

## 2017-02-17 ENCOUNTER — Encounter: Payer: Self-pay | Admitting: Urology

## 2017-02-17 ENCOUNTER — Ambulatory Visit (INDEPENDENT_AMBULATORY_CARE_PROVIDER_SITE_OTHER): Payer: BLUE CROSS/BLUE SHIELD | Admitting: Urology

## 2017-02-17 VITALS — BP 152/79 | HR 125 | Ht 70.0 in | Wt 243.7 lb

## 2017-02-17 DIAGNOSIS — N201 Calculus of ureter: Secondary | ICD-10-CM | POA: Diagnosis not present

## 2017-02-17 LAB — URINALYSIS, COMPLETE
Bilirubin, UA: NEGATIVE
GLUCOSE, UA: NEGATIVE
Ketones, UA: NEGATIVE
NITRITE UA: NEGATIVE
Specific Gravity, UA: 1.025 (ref 1.005–1.030)
UUROB: 0.2 mg/dL (ref 0.2–1.0)
pH, UA: 5 (ref 5.0–7.5)

## 2017-02-17 LAB — MICROSCOPIC EXAMINATION: RBC, UA: >30 /hpf — ABNORMAL HIGH (ref 0–?)

## 2017-02-17 IMAGING — MR MR [PERSON_NAME] LOW WO/W CM*R*
7 of 9 series · 29 of 40 positions shown · IV contrast (multihance)
Comparison: 11/09/2015; report from 12/14/1998

CLINICAL DATA: Right calf pain. Exercise injury July 2015. Knee
surgery in 3994 a sarcoma.

EXAM:
MRI OF LOWER RIGHT EXTREMITY WITHOUT AND WITH CONTRAST
TECHNIQUE: Multiplanar, multisequence MR imaging of the right calf was
performed both before and after administration of intravenous
contrast.
CONTRAST:  20mL MULTIHANCE GADOBENATE DIMEGLUMINE 529 MG/ML IV SOLN

[Series 6: T1 · axial · 8.0mm · 1.09mm/px · z∈[-236,+150]mm · 5 of 40 slices shown (1 of 2)]
[im 1/40]
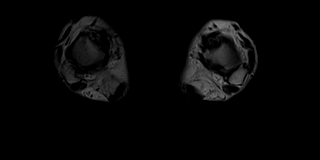
[im 10/40]
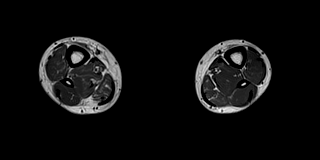
[im 20/40]
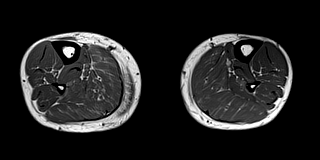
[im 30/40]
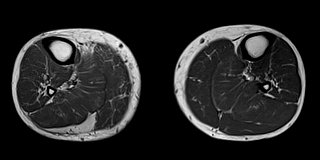
[im 40/40]
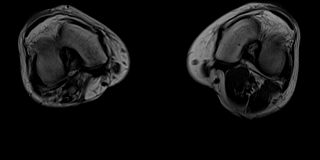

[Series 7: T1 fat-sat · axial · 8.0mm · 1.09mm/px · z∈[-236,+150]mm · 6 of 40 slices shown]
[im 1/40]
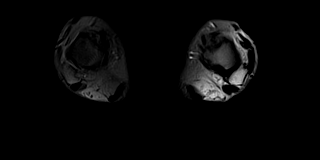
[im 8/40]
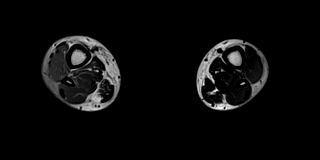
[im 16/40]
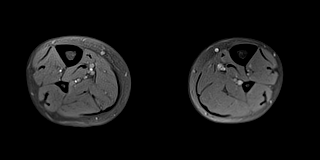
[im 24/40]
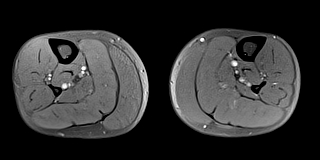
[im 32/40]
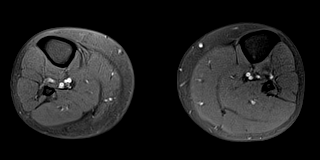
[im 40/40]
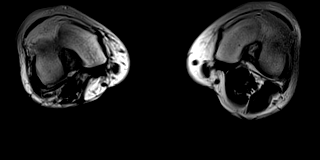

[Series 8: T1 · coronal · 5.0mm · 0.62mm/px · 3 of 21 slices shown (2 of 2)]
[im 1/21]
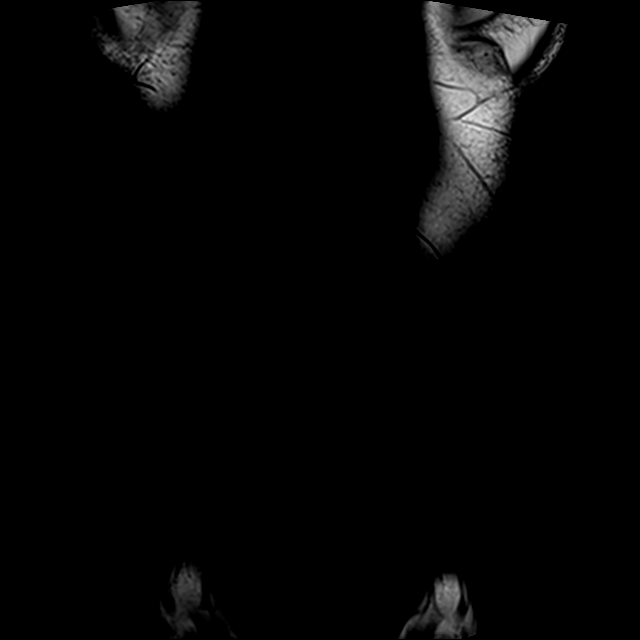
[im 11/21]
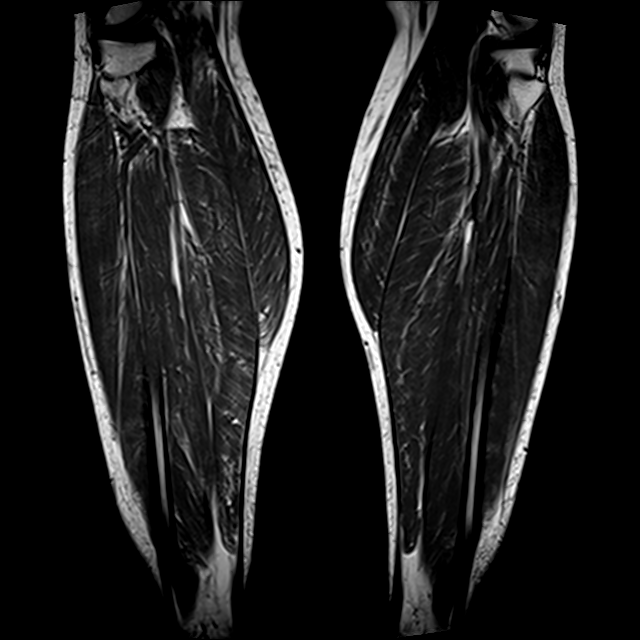
[im 21/21]
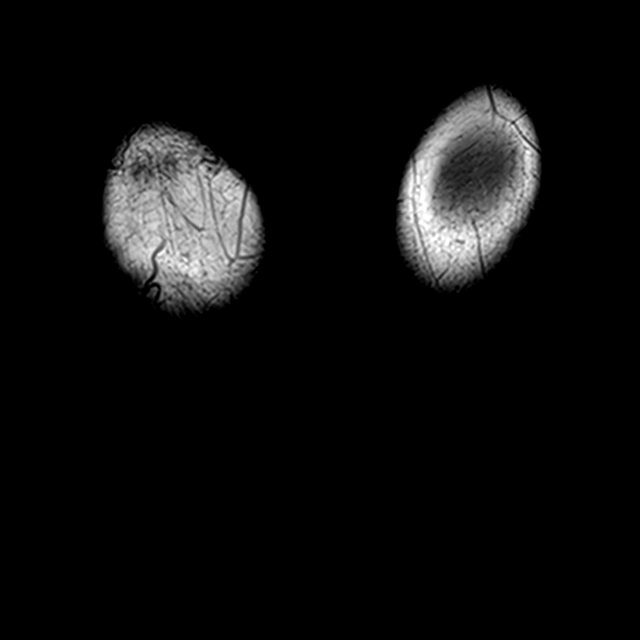

[Series 9: T2 fat-sat · sagittal · 4.0mm · 1.25mm/px · 4 of 24 slices shown (1 of 3)]
[im 1/24]
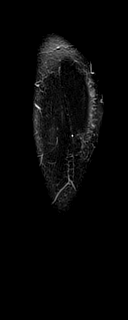
[im 8/24]
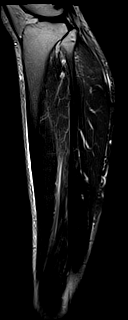
[im 16/24]
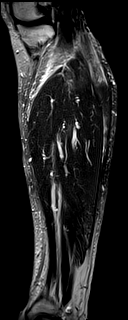
[im 24/24]
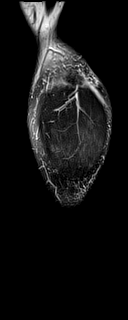

[Series 10: T2 fat-sat · axial · 8.0mm · 0.46mm/px · z∈[-236,+150]mm · 6 of 40 slices shown (2 of 3)]
[im 1/40]
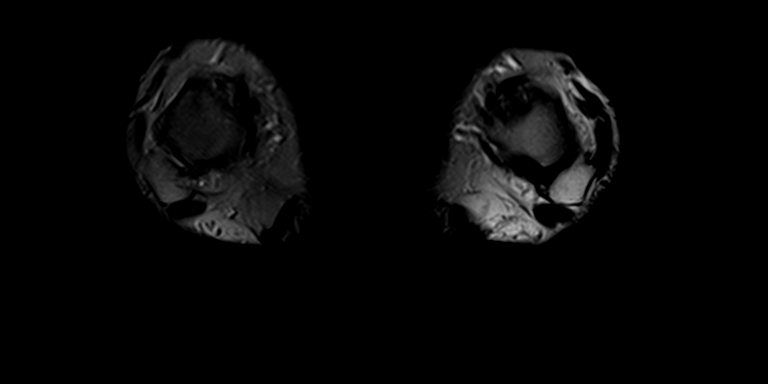
[im 8/40]
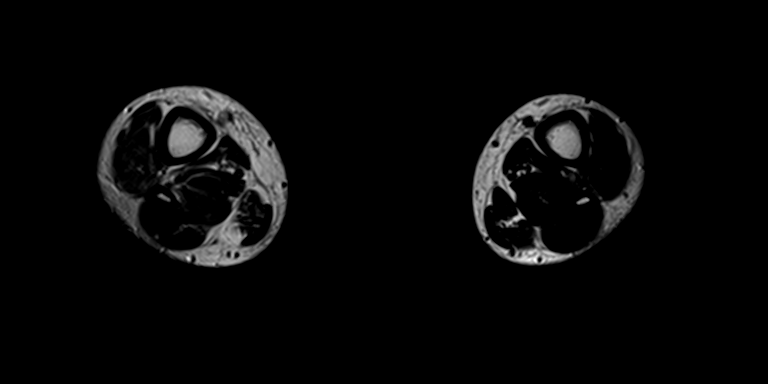
[im 16/40]
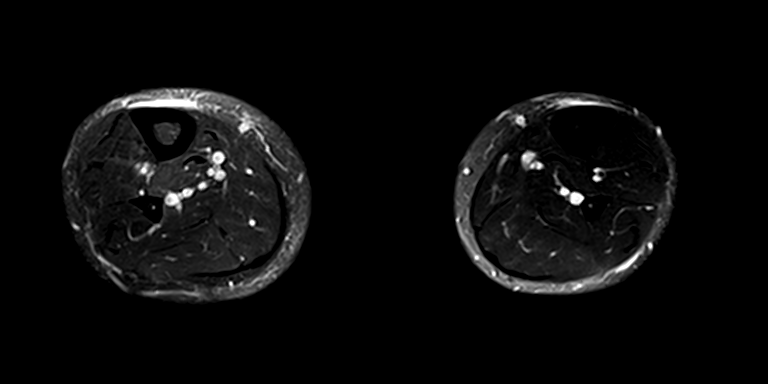
[im 24/40]
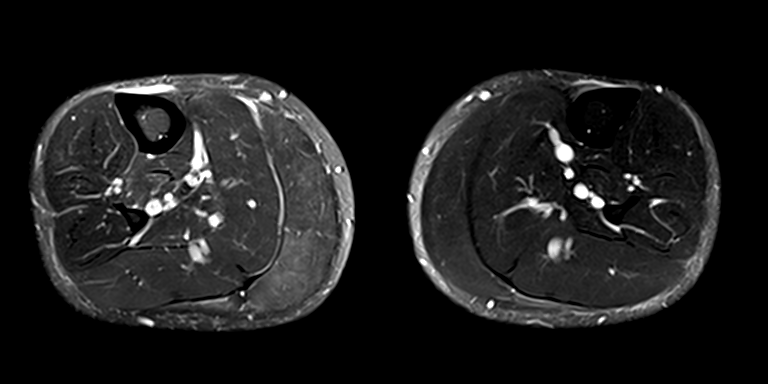
[im 32/40]
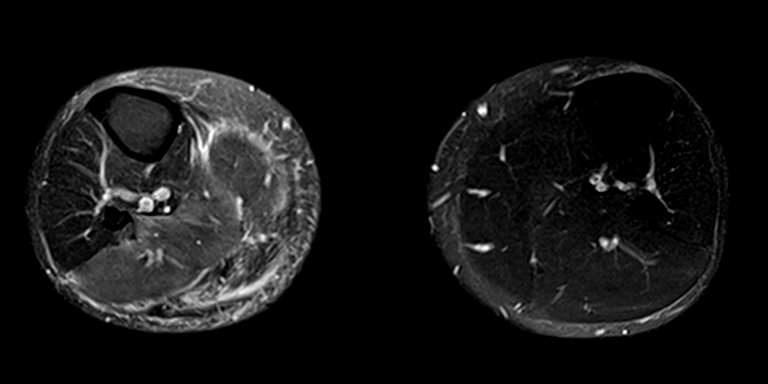
[im 40/40]
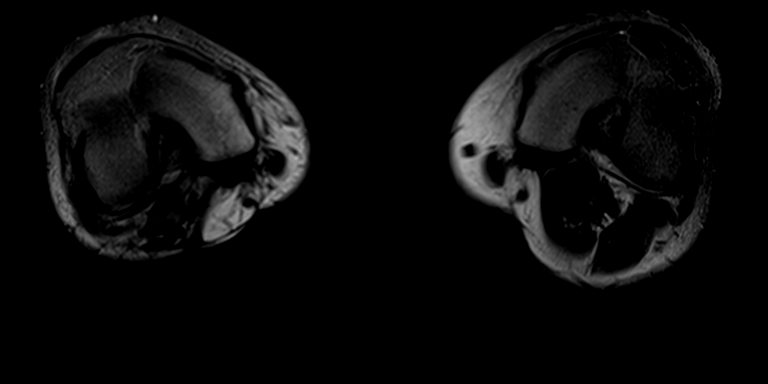

[Series 11: T2 fat-sat · coronal · 5.0mm · 1.25mm/px · 3 of 20 slices shown (3 of 3)]
[im 1/20]
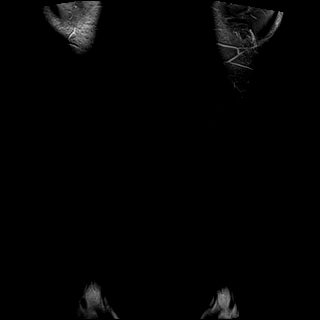
[im 10/20]
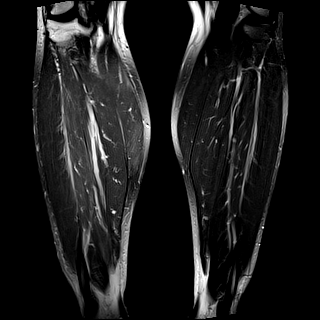
[im 20/20]
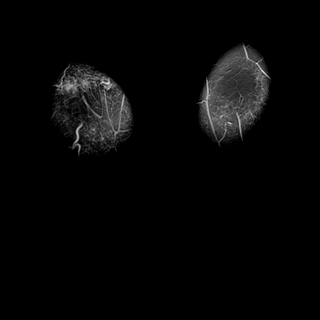

[Series 12: cor post fs · coronal · 5.0mm · 0.62mm/px · 2 of 20 slices shown]
[im 1/20]
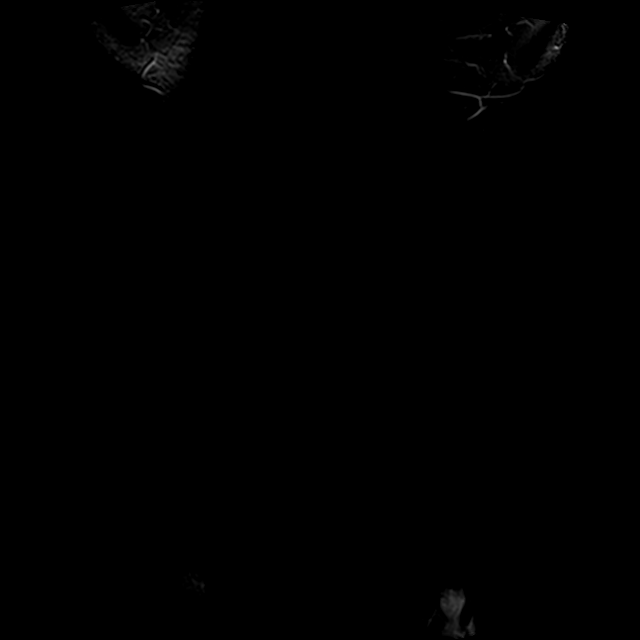
[im 10/20]
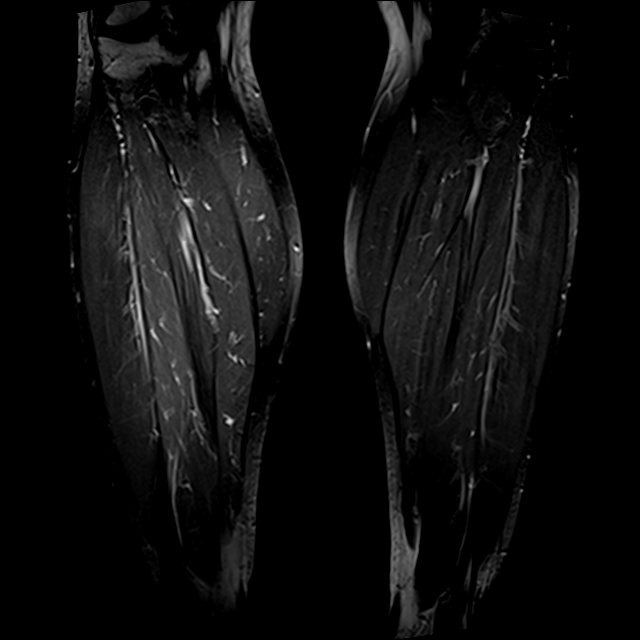

[29 of 40 positions shown; findings below may reference images not displayed]

FINDINGS: Asymmetric atrophy of the right lateral head gastrocnemius muscle.
There is some mild atrophy and low-level edema in the medial head
right gastrocnemius and right soleus muscles proximally, without
appreciable abnormal enhancement in these muscle groups.

Generally no abnormal observed enhancement in the right calf. No
significant bony abnormality involving the tibia or fibula.

No stress fracture is observed. There is some low-level subcutaneous
edema adjacent to the anteromedial tibia bilaterally, slightly
greater on the right than the left ; although often incidental does
can sometimes correlate with shin splints.
IMPRESSION: 1. The dominant finding is asymmetric low-grade infiltrative edema
in the right soleus and medial head right gastrocnemius, likely
reflecting muscle strains.
2. There is complete atrophy of the lateral head gastrocnemius,
probably related to the patient's prior surgery.
3. Mild edema tracking anteromedial to the tibial shaft in the
subcutaneous tissues bilaterally, slightly greater on the right than
the left, likely incidental, less likely to be a manifestation of
shin splints. No stress fracture observed.

## 2017-02-17 MED ORDER — LIDOCAINE HCL 2 % EX GEL
1.0000 "application " | Freq: Once | CUTANEOUS | Status: AC
  Administered 2017-02-17: 1 via URETHRAL

## 2017-02-17 MED ORDER — CIPROFLOXACIN HCL 500 MG PO TABS
500.0000 mg | ORAL_TABLET | Freq: Once | ORAL | Status: AC
  Administered 2017-02-17: 500 mg via ORAL

## 2017-02-17 NOTE — Progress Notes (Signed)
   02/17/17  CC:  Chief Complaint  Patient presents with  . Cysto Stent Removal    HPI: The patient is a 46 year old gentleman who presents today for cystoscopy with right ureteral stent removal after undergoing right ureteroscopy and laser lithotripsy for a 9 mm mid right ureteral stone that failed previous attempt at lithotripsy. He has had 8 stone episodes in his lifetime.  There were no vitals taken for this visit. NED. A&Ox3.   No respiratory distress   Abd soft, NT, ND Normal phallus with bilateral descended testicles  Cystoscopy Procedure Note  Patient identification was confirmed, informed consent was obtained, and patient was prepped using Betadine solution.  Lidocaine jelly was administered per urethral meatus.    Preoperative abx where received prior to procedure.     Pre-Procedure: - Inspection reveals a normal caliber ureteral meatus.  Procedure: The flexible cystoscope was introduced without difficulty - No urethral strictures/lesions are present. - Right ureteral stent was grasped flex the graspers and removed intact per meatus.  Post-Procedure: - Patient tolerated the procedure well  Assessment/ Plan:  1. Right ureteral stone The patient will follow-up in 1 month with renal ultrasound prior to actually hydronephrosis. We will discuss his stone analysis at that time when it is available. He will benefit from a 24-hour urine study as he has had 8 stone episodes in his lifetime.

## 2017-02-20 DIAGNOSIS — G4733 Obstructive sleep apnea (adult) (pediatric): Secondary | ICD-10-CM | POA: Diagnosis not present

## 2017-02-21 ENCOUNTER — Ambulatory Visit: Payer: BLUE CROSS/BLUE SHIELD | Admitting: Urology

## 2017-02-21 LAB — STONE ANALYSIS
Ca Oxalate,Dihydrate: 7 %
Ca Oxalate,Monohydr.: 88 %
Ca phos cry stone ql IR: 5 %
STONE WEIGHT KSTONE: 81.2 mg

## 2017-03-15 ENCOUNTER — Ambulatory Visit
Admission: RE | Admit: 2017-03-15 | Discharge: 2017-03-15 | Disposition: A | Discharge status: Home / Self Care | Payer: BLUE CROSS/BLUE SHIELD | Source: Ambulatory Visit | Attending: Urology | Admitting: Urology

## 2017-03-15 DIAGNOSIS — N201 Calculus of ureter: Secondary | ICD-10-CM | POA: Diagnosis present

## 2017-03-15 DIAGNOSIS — N200 Calculus of kidney: Secondary | ICD-10-CM | POA: Diagnosis not present

## 2017-03-17 ENCOUNTER — Ambulatory Visit (INDEPENDENT_AMBULATORY_CARE_PROVIDER_SITE_OTHER): Payer: BLUE CROSS/BLUE SHIELD | Admitting: Urology

## 2017-03-17 VITALS — BP 136/85 | HR 103 | Ht 70.0 in | Wt 255.0 lb

## 2017-03-17 DIAGNOSIS — N200 Calculus of kidney: Secondary | ICD-10-CM | POA: Diagnosis not present

## 2017-03-17 NOTE — Progress Notes (Signed)
03/17/2017 4:31 PM   Elijah Mora June 27, 1971 [id number]  Referring provider: Birdie Sons, [place], [place] [address]  No chief complaint on file.   HPI: The patient presents today for postoperative follow-up after undergoing cystoscopy with right ureteroscopy one month ago. His postop renal ultrasound shows no hydronephrosis. The radiologist agreed that it 8 mm interpolar right renal calculus. This in my opinion is over read as his only stone was the 9 mm right ureteral stone on recent CT scan that was addressed during his procedure and removed intact. This is likely artifact seen on the renal ultrasound.  His stone was 95% calcium oxalate and 5% calcium phosphate.     PMH: Past Medical History:  Diagnosis Date  . Anxiety   . Cancer (HCC)    SYNOVIAL CARCINOMA  . GERD (gastroesophageal reflux disease)    OCC  . History of kidney stones   . 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 Sleep apnea    CPAP    Surgical History: Past Surgical History:  Procedure Laterality Date  . CYSTOSCOPY WITH STENT PLACEMENT Right 02/10/2017   Procedure: CYSTOSCOPY WITH STENT PLACEMENT;  Surgeon: Nickie Retort, MD;  Location: [facility] ORS;  Service: Urology;  Laterality: Right;  . EXTRACORPOREAL SHOCK WAVE LITHOTRIPSY Right 02/02/2017   Procedure: EXTRACORPOREAL SHOCK WAVE LITHOTRIPSY (ESWL);  Surgeon: Hollice Espy, MD;  Location: [facility] ORS;  Service: Urology;  Laterality: Right;  . 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 Dr. Konrad Penta  . KIDNEY STONE SURGERY    . Synovial cancer     behind right knee. Off Chemo 06/1999  . URETEROSCOPY WITH HOLMIUM LASER LITHOTRIPSY Right 02/10/2017   Procedure: URETEROSCOPY WITH HOLMIUM LASER LITHOTRIPSY;  Surgeon: Nickie Retort, MD;  Location: [facility] ORS;  Service: Urology;  Laterality: Right;    Home Medications:  Allergies as of 03/17/2017   No Known  Allergies     Medication List       Accurate as of 03/17/17  4:31 PM. Always use your most recent med list.          aspirin 81 MG tablet Take 81 mg by mouth daily.   cetirizine 10 MG tablet Commonly known as:  ZYRTEC Take 10 mg by mouth daily.   escitalopram 10 MG tablet Commonly known as:  LEXAPRO Take 0.5 tablets (5 mg total) by mouth daily.   ibuprofen 800 MG tablet Commonly known as:  ADVIL,MOTRIN Take 1 tablet (800 mg total) by mouth every 8 (eight) hours as needed.   ondansetron 4 MG disintegrating tablet Commonly known as:  ZOFRAN ODT Take 1 tablet (4 mg total) by mouth every 8 (eight) hours as needed for nausea or vomiting.   polyethylene glycol packet Commonly known as:  MIRALAX / GLYCOLAX Take 17 g by mouth daily.   tamsulosin 0.4 MG Caps capsule Commonly known as:  FLOMAX Take 1 capsule (0.4 mg total) by mouth daily.       Allergies: No Known Allergies  Family History: Family History  Problem Relation Age of Onset  . Cancer Mother 70       Breast  . Kidney Stones Mother   . Diabetes Father   . Hypertension Father   . Breast cancer Maternal Grandmother   . Kidney Stones Other   . Lymphoma Maternal Grandfather     Social History:  reports that he has never smoked. He has never  used smokeless tobacco. He reports that he drinks about 2.4 oz of alcohol per week . He reports that he does not use drugs.  ROS: UROLOGY Frequent Urination?: No Hard to postpone urination?: No Burning/pain with urination?: No Get up at night to urinate?: No Leakage of urine?: No Urine stream starts and stops?: No Trouble starting stream?: No Do you have to strain to urinate?: No Blood in urine?: No Urinary tract infection?: No Sexually transmitted disease?: No Injury to kidneys or bladder?: No Painful intercourse?: No Weak stream?: No Erection problems?: No Penile pain?: No  Gastrointestinal Nausea?: No Vomiting?: No Indigestion/heartburn?: No Diarrhea?:  No Constipation?: No  Constitutional Fever: No Night sweats?: No Weight loss?: No Fatigue?: No  Skin Skin rash/lesions?: No Itching?: No  Eyes Blurred vision?: No Double vision?: No  Ears/Nose/Throat Sore throat?: No Sinus problems?: No  Hematologic/Lymphatic Swollen glands?: No Easy bruising?: No  Cardiovascular Leg swelling?: No Chest pain?: No  Respiratory Cough?: No Shortness of breath?: No  Endocrine Excessive thirst?: No  Musculoskeletal Back pain?: No Joint pain?: No  Neurological Headaches?: No Dizziness?: No  Psychologic Depression?: No Anxiety?: Yes  Physical Exam: BP 136/85 (BP Location: Left Arm, Patient Position: Sitting, Cuff Size: Large)   Pulse (!) 103   Ht 5\' 10"  (1.778 m)   Wt 255 lb (115.7 kg)   BMI 36.59 kg/m   Constitutional:  Alert and oriented, No acute distress. HEENT: [place] AT, moist mucus membranes.  Trachea midline, no masses. Cardiovascular: No clubbing, cyanosis, or edema. Respiratory: Normal respiratory effort, no increased work of breathing. GI: Abdomen is soft, nontender, nondistended, no abdominal masses GU: No CVA tenderness.  Skin: No rashes, bruises or suspicious lesions. Lymph: No cervical or inguinal adenopathy. Neurologic: Grossly intact, no focal deficits, moving all 4 extremities. Psychiatric: Normal mood and affect.  Laboratory Data: Lab Results  Component Value Date   WBC 16.8 (H) 02/01/2017   HGB 17.0 02/01/2017   HCT 49.7 02/01/2017   MCV 87.7 02/01/2017   PLT 300 02/01/2017    Lab Results  Component Value Date   CREATININE 1.64 (H) 02/01/2017    No results found for: PSA  No results found for: TESTOSTERONE  No results found for: HGBA1C  Urinalysis    Component Value Date/Time   COLORURINE YELLOW (A) 02/01/2017 2303   APPEARANCEUR Cloudy (A) 02/17/2017 1330   LABSPEC 1.013 02/01/2017 2303   LABSPEC 1.024 08/05/2013 0247   PHURINE 6.0 02/01/2017 2303   GLUCOSEU Negative 02/17/2017  1330   GLUCOSEU Negative 08/05/2013 0247   HGBUR SMALL (A) 02/01/2017 2303   BILIRUBINUR Negative 02/17/2017 1330   BILIRUBINUR Negative 08/05/2013 [place] 02/01/2017 2303   PROTEINUR 2+ (A) 02/17/2017 1330   PROTEINUR NEGATIVE 02/01/2017 2303   UROBILINOGEN 0.2 01/24/2017 1417   NITRITE Negative 02/17/2017 1330   NITRITE NEGATIVE 02/01/2017 2303   LEUKOCYTESUR Trace (A) 02/17/2017 1330   LEUKOCYTESUR Negative 08/05/2013 0247    Pertinent Imaging: Renal ultrasound reviewed as above  Assessment & Plan:   1. History of nephrolithiasis I went over the ABCs of stone formation with the patient. We also discussed his stone analysis as well as ways to prevent future stones. This is his a stone episode, so I strongly encouraged him to undergo a metabolic workup with a 19-ERDE urine study. He is agreeable to proceeding with this. We will arrange for him to undergo this and follow-up in approximately 6 weeks once completed.  Return in about 6 weeks (around  04/28/2017) for after urine study.  Nickie Retort, MD  [facility] [address] [place], [place] [address] [phone]

## 2017-03-23 DIAGNOSIS — G4733 Obstructive sleep apnea (adult) (pediatric): Secondary | ICD-10-CM | POA: Diagnosis not present

## 2017-04-07 DIAGNOSIS — Z48812 Encounter for surgical aftercare following surgery on the circulatory system: Secondary | ICD-10-CM | POA: Diagnosis not present

## 2017-04-07 DIAGNOSIS — I739 Peripheral vascular disease, unspecified: Secondary | ICD-10-CM | POA: Diagnosis not present

## 2017-04-07 DIAGNOSIS — I776 Arteritis, unspecified: Secondary | ICD-10-CM | POA: Diagnosis not present

## 2017-04-10 DIAGNOSIS — T814XXD Infection following a procedure, subsequent encounter: Secondary | ICD-10-CM | POA: Diagnosis not present

## 2017-04-10 DIAGNOSIS — I776 Arteritis, unspecified: Secondary | ICD-10-CM | POA: Diagnosis not present

## 2017-04-10 DIAGNOSIS — Z48812 Encounter for surgical aftercare following surgery on the circulatory system: Secondary | ICD-10-CM | POA: Diagnosis not present

## 2017-04-22 DIAGNOSIS — G4733 Obstructive sleep apnea (adult) (pediatric): Secondary | ICD-10-CM | POA: Diagnosis not present

## 2017-04-28 ENCOUNTER — Ambulatory Visit: Payer: BLUE CROSS/BLUE SHIELD

## 2017-05-02 ENCOUNTER — Ambulatory Visit: Payer: Self-pay | Admitting: Medical

## 2017-05-03 ENCOUNTER — Other Ambulatory Visit: Payer: Self-pay | Admitting: Adult Health

## 2017-05-03 ENCOUNTER — Encounter: Payer: Self-pay | Admitting: Adult Health

## 2017-05-03 ENCOUNTER — Ambulatory Visit: Payer: Self-pay | Admitting: Adult Health

## 2017-05-03 VITALS — BP 136/66 | HR 90 | Temp 98.8°F

## 2017-05-03 DIAGNOSIS — C44702 Unspecified malignant neoplasm of skin of right lower limb, including hip: Secondary | ICD-10-CM

## 2017-05-03 DIAGNOSIS — T82898S Other specified complication of vascular prosthetic devices, implants and grafts, sequela: Secondary | ICD-10-CM

## 2017-05-03 DIAGNOSIS — R21 Rash and other nonspecific skin eruption: Secondary | ICD-10-CM

## 2017-05-04 LAB — COMPREHENSIVE METABOLIC PANEL
ALT: 61 IU/L — ABNORMAL HIGH (ref 0–44)
AST: 25 IU/L (ref 0–40)
Albumin/Globulin Ratio: 1.7 (ref 1.2–2.2)
Albumin: 4.5 g/dL (ref 3.5–5.5)
Alkaline Phosphatase: 88 IU/L (ref 39–117)
BUN/Creatinine Ratio: 15 (ref 9–20)
BUN: 15 mg/dL (ref 6–24)
Bilirubin Total: 0.4 mg/dL (ref 0.0–1.2)
CO2: 23 mmol/L (ref 20–29)
CREATININE: 0.99 mg/dL (ref 0.76–1.27)
Calcium: 9.7 mg/dL (ref 8.7–10.2)
Chloride: 105 mmol/L (ref 96–106)
GFR calc Af Amer: 107 mL/min/{1.73_m2} (ref >59)
GFR calc non Af Amer: 93 mL/min/{1.73_m2} (ref >59)
GLOBULIN, TOTAL: 2.7 g/dL (ref 1.5–4.5)
GLUCOSE: 151 mg/dL — AB (ref 65–99)
Potassium: 4.4 mmol/L (ref 3.5–5.2)
SODIUM: 141 mmol/L (ref 134–144)
Total Protein: 7.2 g/dL (ref 6.0–8.5)

## 2017-05-04 LAB — CBC WITH DIFFERENTIAL/PLATELET
BASOS ABS: 0.1 10*3/uL (ref 0.0–0.2)
Basos: 1 %
EOS (ABSOLUTE): 0.5 10*3/uL — ABNORMAL HIGH (ref 0.0–0.4)
EOS: 5 %
HEMATOCRIT: 42.3 % (ref 37.5–51.0)
Hemoglobin: 14.5 g/dL (ref 13.0–17.7)
IMMATURE GRANULOCYTES: 1 %
Immature Grans (Abs): 0.1 10*3/uL (ref 0.0–0.1)
Lymphocytes Absolute: 2.8 10*3/uL (ref 0.7–3.1)
Lymphs: 32 %
MCH: 30.7 pg (ref 26.6–33.0)
MCHC: 34.3 g/dL (ref 31.5–35.7)
MCV: 90 fL (ref 79–97)
MONOCYTES: 7 %
MONOS ABS: 0.6 10*3/uL (ref 0.1–0.9)
Neutrophils Absolute: 4.6 10*3/uL (ref 1.4–7.0)
Neutrophils: 54 %
Platelets: 265 10*3/uL (ref 150–379)
RBC: 4.72 x10E6/uL (ref 4.14–5.80)
RDW: 13.9 % (ref 12.3–15.4)
WBC: 8.6 10*3/uL (ref 3.4–10.8)

## 2017-05-04 NOTE — Progress Notes (Addendum)
Subjective:     Patient ID: Elijah Mora, male   DOB: [birth year], 46 y.o.   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 He has since removed compression stocking for the past two weeks.  He reports he has had a dry skin rash that itches occasionally for one week.He denies any change in size or characteristics  from original appearance.  He denies any warmth, pain or drainage from the rash. He denies any leg pain or pain with walking or movement. Marland Kitchen He denies any fevers or changes in temperature to leg. 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 He reports regular follow up with his surgeon. He does report a post operative wound in September 2017 from Femoral Popliteal Bypass that did heal completely.   He also reports he recently had a normal ABIS/ Doppler study. He denies any chest pain, shortness of breath, nausea, vomiting or diarrhea. He denies any fevers.  Blood pressure 136/66, pulse 90, temperature 98.8 F (37.1 C), temperature source Tympanic, SpO2 99 %. No Known Allergies  Current Outpatient Prescriptions:  .  aspirin 81 MG tablet, Take 81 mg by mouth daily., Disp: , Rfl:  .  atorvastatin (LIPITOR) 40 MG tablet, Take by mouth., Disp: , Rfl:  .  cetirizine (ZYRTEC) 10 MG tablet, Take 10 mg by mouth daily., Disp: , Rfl:  .  escitalopram (LEXAPRO) 10 MG tablet, Take 0.5 tablets (5 mg total) by mouth daily., Disp: 1 tablet, Rfl: 5 .  ibuprofen (ADVIL,MOTRIN) 800 MG tablet, Take 1 tablet (800 mg total) by mouth every 8 (eight) hours as needed. (Patient not taking: Reported on 02/07/2017), Disp: 90 tablet, Rfl: 0 .  ondansetron (ZOFRAN ODT) 4 MG  disintegrating tablet, Take 1 tablet (4 mg total) by mouth every 8 (eight) hours as needed for nausea or vomiting. (Patient not taking: Reported on 02/17/2017), Disp: 20 tablet, Rfl: 0 .  polyethylene glycol (MIRALAX / GLYCOLAX) packet, Take 17 g by mouth daily., Disp: , Rfl:  .  tamsulosin (FLOMAX) 0.4 MG CAPS capsule, Take 1 capsule (0.4 mg total) by mouth daily. (Patient not taking: Reported on 02/17/2017), Disp: 30 capsule, Rfl: 0 (Epic was down during visit and remainder of the day and provider had to obtain history and full assessment from patient. )  05/04/17 provider reviewed Epic and this ABIS was performed on 04/07/2017 and shows mid SFA-BK popliteal bypass graft is patent with no evidence of significant stenosis. Essentially unchanged from previous study 08/03/16.   Review of Systems  Constitutional: Negative for activity change, appetite change, chills, diaphoresis, fatigue, fever and unexpected weight change.  HENT: Negative.   Eyes: Negative.   Respiratory: Negative.  Negative for apnea, cough, choking, chest tightness, shortness of breath, wheezing and stridor.   Cardiovascular: Negative.  Negative for chest pain, palpitations and leg swelling.  Gastrointestinal: Negative.   Endocrine: Negative.   Genitourinary: Negative.   Musculoskeletal: Negative.   Skin: Positive for rash (pink rash present for 1 week, itches occasionally, denies any pain, warmth, coolness to right lower leg ). Negative for color change, pallor and wound.  Allergic/Immunologic: Negative.  Neurological: Negative.   Hematological: Negative.   Psychiatric/Behavioral: Negative.        Objective:   Physical Exam  Constitutional: He is oriented to person, place, and time. Vital signs are normal. He appears well-developed and well-nourished. He is active. No distress. He is not intubated.  HENT:  Head: Normocephalic and atraumatic.  Nose: Nose normal.  Mouth/Throat: Oropharynx is clear and moist. No  oropharyngeal exudate.  Eyes: Pupils are equal, round, and reactive to light. Conjunctivae and lids are normal.  Neck: Trachea normal, normal range of motion and full passive range of motion without pain. Neck supple. Normal carotid pulses, no hepatojugular reflux and no JVD present. No spinous process tenderness and no muscular tenderness present. Carotid bruit is not present. No neck rigidity. No tracheal deviation, no edema, no erythema and normal range of motion present. No thyromegaly present.  Cardiovascular: Normal rate, regular rhythm, S1 normal, S2 normal, normal heart sounds, intact distal pulses and normal pulses.  Exam reveals no gallop, no distant heart sounds and no friction rub.   No murmur heard. Pulses:      Carotid pulses are 2+ on the right side, and 2+ on the left side.      Radial pulses are 2+ on the right side, and 2+ on the left side.       Femoral pulses are 2+ on the right side, and 2+ on the left side.      Popliteal pulses are 2+ on the right side, and 2+ on the left side.       Dorsalis pedis pulses are 2+ on the right side, and 2+ on the left side.       Posterior tibial pulses are 2+ on the right side, and 2+ on the left side.  Pulmonary/Chest: Effort normal and breath sounds normal. No stridor. No apnea, no tachypnea and no bradypnea. He is not intubated. No respiratory distress. He has no wheezes. He has no rales. He exhibits no tenderness.  Abdominal: Soft. Normal appearance.  Musculoskeletal: Normal range of motion. He exhibits no edema, tenderness or deformity.  Moves on and off exam table and in halls without difficulty. He has normal gait.   Lymphadenopathy:    He has no cervical adenopathy.    He has no axillary adenopathy.  Neurological: He is alert and oriented to person, place, and time. He has normal strength and normal reflexes. He displays normal reflexes. No cranial nerve deficit or sensory deficit. He exhibits normal muscle tone. Coordination and gait  normal. GCS eye subscore is 4. GCS verbal subscore is 5. GCS motor subscore is 6.  Skin: Skin is warm and dry. Rash noted. No abrasion, no bruising, no burn, no ecchymosis, no laceration, no lesion, no petechiae and no purpura noted. Rash is macular. Rash is not papular, not maculopapular, not nodular, not pustular, not vesicular and not urticarial. He is not diaphoretic. There is erythema (mild pink ). No cyanosis. No pallor. Nails show no clubbing.     Pink skin measuring 4 cm x 3 cm with small  scattered scaly silvery skin.Right medial lower leg below previous surgical scar line.   No warmth, drainage, coolness, or pain. Negative Homans sign. Denies any pain in leg or any pain with any range of motion.    Psychiatric: He has a normal mood and affect. His speech is normal and behavior is normal. Judgment and thought content normal. Cognition and memory are normal.  Vitals reviewed.  Assessment:     1. Discussed above history and rash with Dr. Miguel Aschoff and he is in agreement with Assessment and plan. Advised will treat as likely psoriatic lesion with Mometasone cream.     Plan:     1. Discussed with patient signs and symptoms of infection and vasular wound signs such as warmth to skin,coolness to skin drainage, pain, spreading rash, fever , spreading redness or any other persistent or changing signs or symptoms. Advised he also need to call his primary care and surgical care team to inform. He is to go to the emergency room/ urgent care for any changes or worsening symptoms. Patient verbalized understanding off all above instructions. If no improvement with prescription or any of above he will seek care in Urgent Care Emergency room if clinic is closed. Patient verbalized understanding of all instructions above and does not have any questions at this time.   2. Rash right medial lower leg: likely psoriatic will treat with Mometasone Cream topical 0.1 % apply once daily to rash for 14  days. 45 gram tube prescribed to [facility], read back and confirmed.  Dr. Rosanna Randy discussed with provider and in agreement with treatment plan.

## 2017-05-04 NOTE — Patient Instructions (Signed)
Psoriasis  Psoriasis is a long-term (chronic) skin condition. It causes raised, red patches (plaques) on your skin that look silvery. The red patches may show up anywhere on your body. They can be any size or shape.  Psoriasis can come and go. It can range from mild to very bad. It cannot be passed from one person to another (not contagious). There is no cure for this condition, but it can be helped with treatment.  Follow these instructions at home:  Skin Care   Apply moisturizers to your skin as needed. Only use those that your doctor has said are okay.   Apply cool compresses to the affected areas.   Do not scratch your skin.  Lifestyle     Do not use tobacco products. This includes cigarettes, chewing tobacco, and e-cigarettes. If you need help quitting, ask your doctor.   Drink little or no alcohol.   Try to lower your stress. Meditation or yoga may help.   Get sun as told by your doctor. Do not get sunburned.   Think about joining a psoriasis support group.  Medicines   Take or use over-the-counter and prescription medicines only as told by your doctor.   If you were prescribed an antibiotic, take or use it as told by your doctor. Do not stop taking the antibiotic even if your condition starts to get better.  General instructions   Keep a journal. Use this to help track what triggers an outbreak. Try to avoid any triggers.   See a counselor or social worker if you feel very sad, upset, or hopeless about your condition and these feelings affect your work or relationships.   Keep all follow-up visits as told by your doctor. This is important.  Contact a doctor if:   Your pain gets worse.   You have more redness or warmth in the affected areas.   You have new pain or stiffness in your joints.   Your pain or stiffness in your joints gets worse.   Your nails start to break easily.   Your nails pull away from the nail bed easily.   You have a fever.   You feel very sad (depressed).  This  information is not intended to replace advice given to you by your health care provider. Make sure you discuss any questions you have with your health care provider.  Document Released: 10/20/2004 Document Revised: 02/18/2016 Document Reviewed: 01/28/2015  Elsevier Interactive Patient Education  2018 Elsevier Inc.

## 2017-05-05 ENCOUNTER — Telehealth: Payer: Self-pay

## 2017-05-05 NOTE — Telephone Encounter (Signed)
Spoke with pt, who states his rash is "dramatically better." He states that the medial portion of the rash is less red. He verbalizes understanding regarding going to the ER or Urgent care if sx worsen and will call his primary/vasuclar sx to schedule an appointment for next week.

## 2017-05-05 NOTE — Telephone Encounter (Signed)
Called pt to follow up on status of his rash since his visit. No answer. Will call again this afternoon.

## 2017-05-05 NOTE — Telephone Encounter (Signed)
-----   Message from Doreen Beam, [place] sent at 05/04/2017 10:01 PM EDT ----- Hilda Blades Will you call this patient on 05/05/17 and follow up with him on rash to see if any changes since office visit.  Advise him to also call and schedule an appointment with his primary care MD or vascular surgeon next week Monday 05/08/17 or Tuesday  05/09/17 and to go to the Urgent care/ emergency room over weekend if rash  Worsens or he has any new symptoms or pain.Please document as a telephone call. Thanks  Thanks,   Sharyn Lull FNP

## 2017-05-23 DIAGNOSIS — G4733 Obstructive sleep apnea (adult) (pediatric): Secondary | ICD-10-CM | POA: Diagnosis not present

## 2017-05-25 ENCOUNTER — Ambulatory Visit: Payer: BLUE CROSS/BLUE SHIELD

## 2017-06-07 ENCOUNTER — Other Ambulatory Visit: Payer: Self-pay | Admitting: Family Medicine

## 2017-06-07 DIAGNOSIS — F419 Anxiety disorder, unspecified: Secondary | ICD-10-CM

## 2017-06-07 MED ORDER — ESCITALOPRAM OXALATE 10 MG PO TABS: 5.0000 mg | ORAL_TABLET | Freq: Every day | ORAL | 5 refills | Status: DC

## 2017-06-07 NOTE — Telephone Encounter (Signed)
Pt needs refill on his generic Lexapro 10 mg  He wants to use [facility] on [address].  Thanks [employer]

## 2017-06-08 NOTE — Telephone Encounter (Signed)
Rx called in to pharmacy. 

## 2017-08-25 IMAGING — US US EXTREM LOW VENOUS*R*
1 series · 13 of 24 positions shown · non-contrast
Comparison: 11/09/2015

CLINICAL DATA: Pain swelling.



[Series 1: us extrem low venous*right* · 0.08mm/px · 13 of 57 slices shown]
[im 1/57]
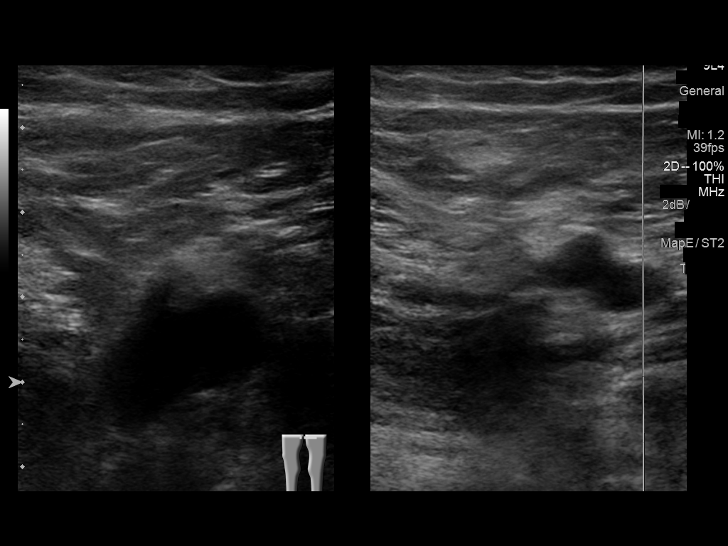
[im 5/57]
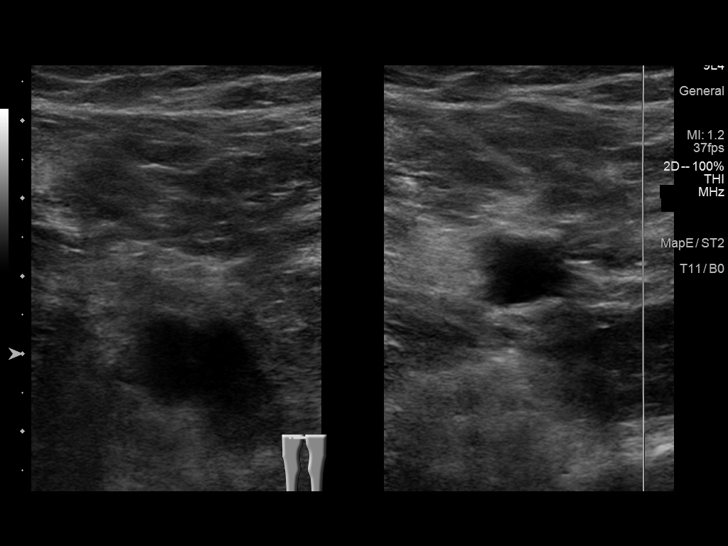
[im 10/57]
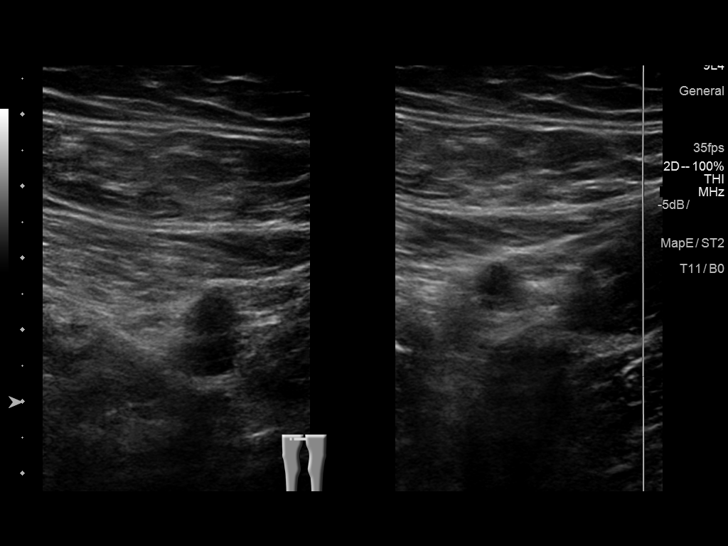
[im 15/57]
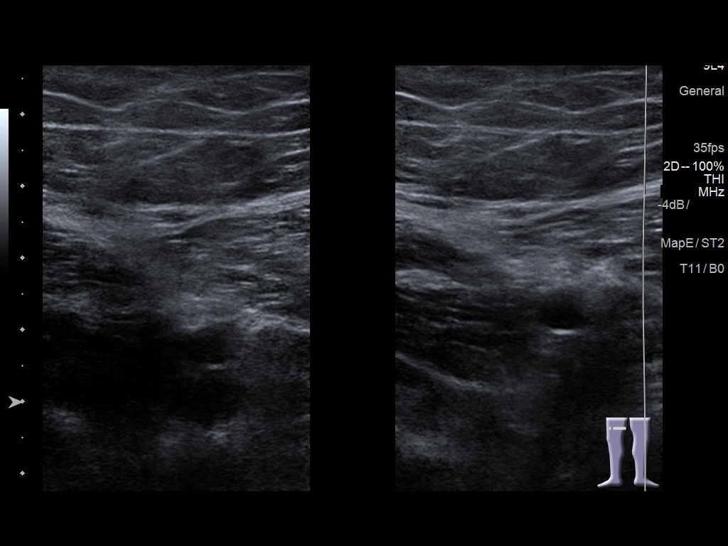
[im 20/57]
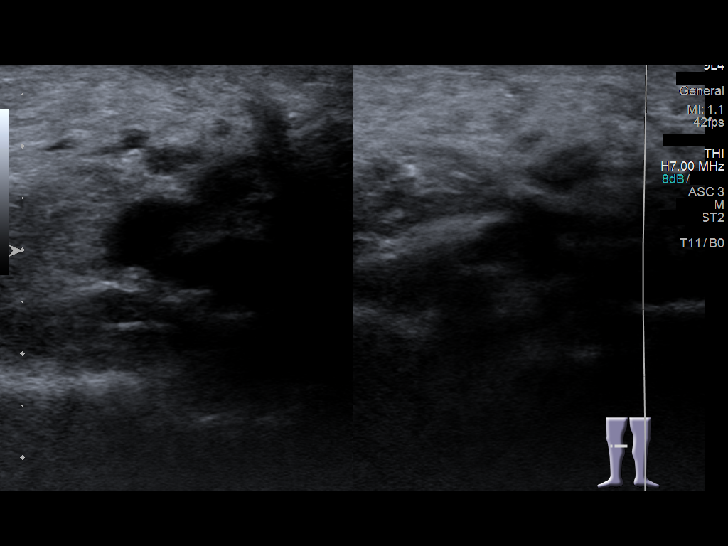
[im 25/57]
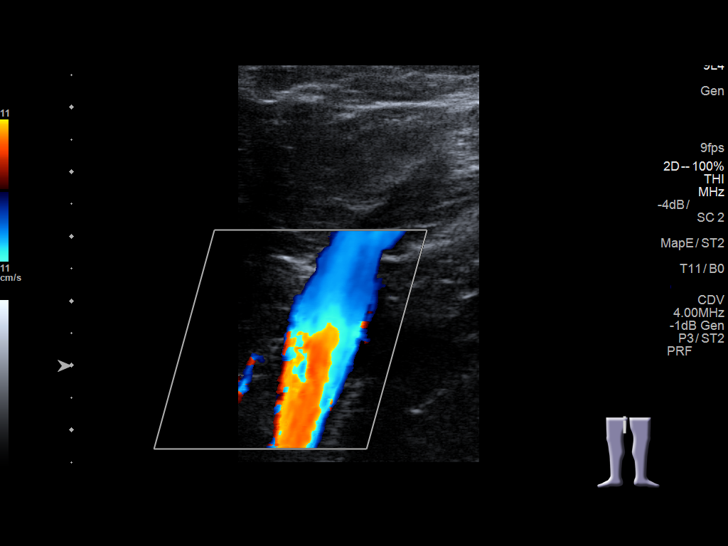
[im 30/57]
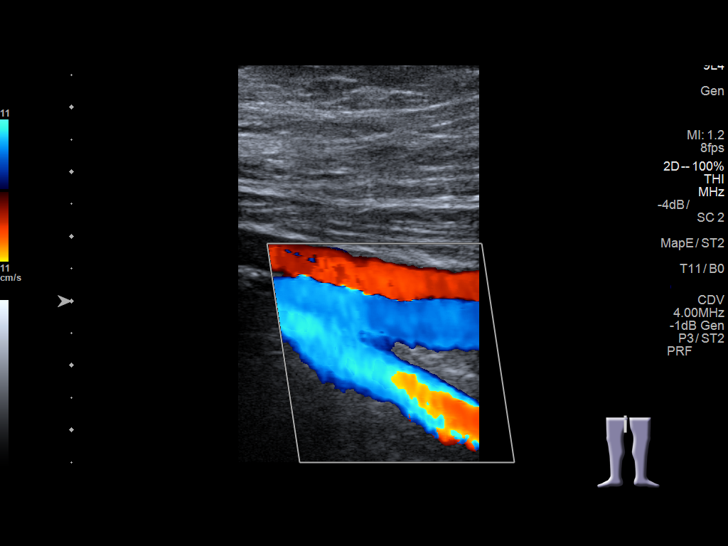
[im 32/57]
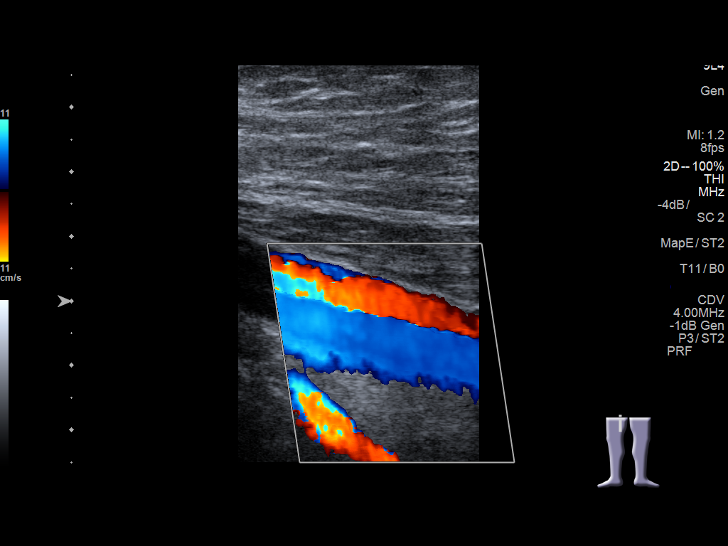
[im 37/57]
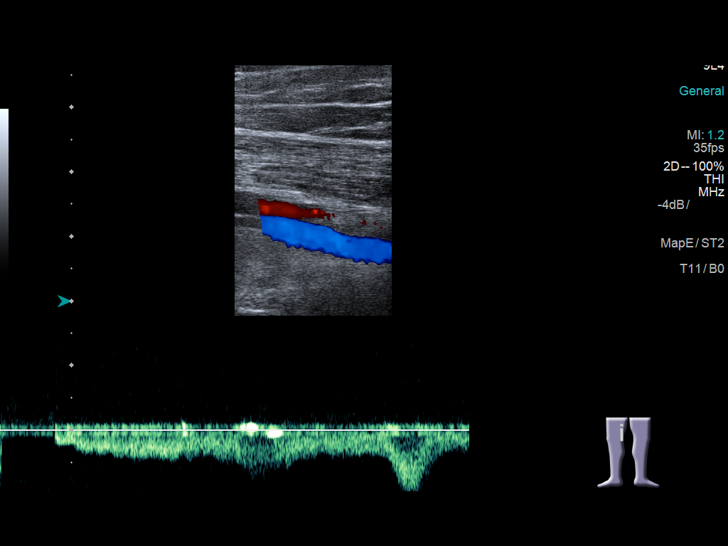
[im 42/57]
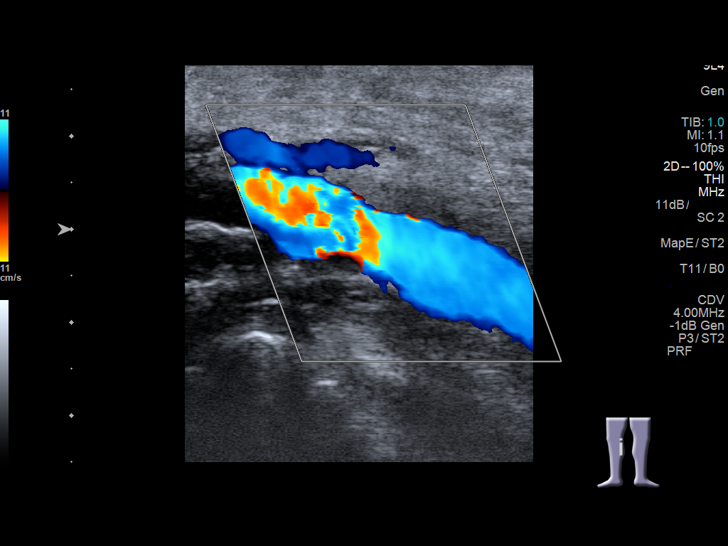
[im 47/57]
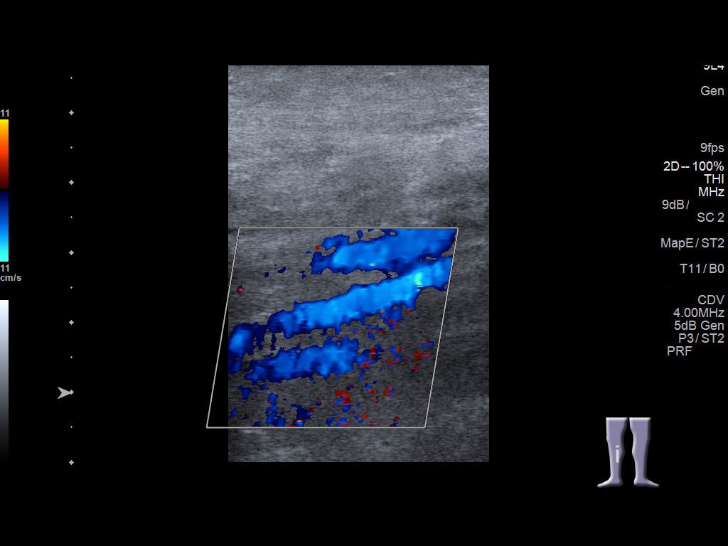
[im 52/57]
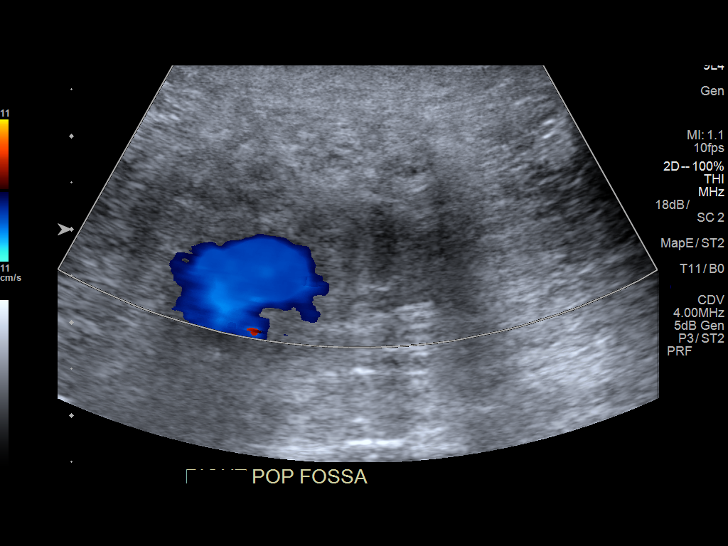
[im 57/57]
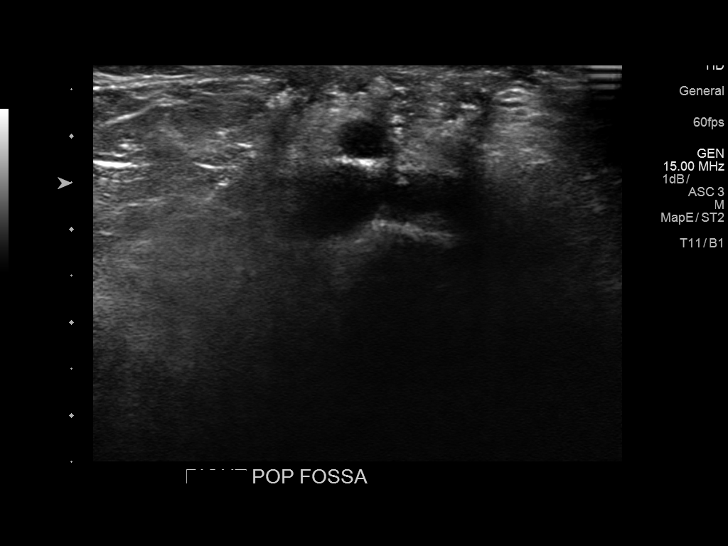

[13 of 24 positions shown; findings below may reference images not displayed]

FINDINGS: Contralateral Common Femoral Vein: Respiratory phasicity is normal
and symmetric with the symptomatic side. No evidence of thrombus.
Normal compressibility.

Common Femoral Vein: No evidence of thrombus. Normal
compressibility, respiratory phasicity and response to augmentation.

Saphenofemoral Junction: No evidence of thrombus. Normal
compressibility and flow on color Doppler imaging.

Profunda Femoral Vein: No evidence of thrombus. Normal
compressibility and flow on color Doppler imaging.

Femoral Vein: No evidence of thrombus. Normal compressibility,
respiratory phasicity and response to augmentation.

Popliteal Vein: No evidence of thrombus. Normal compressibility,
respiratory phasicity and response to augmentation.

Calf Veins: No evidence of thrombus. Normal compressibility and flow
on color Doppler imaging.

Superficial Great Saphenous Vein: No evidence of thrombus. Normal
compressibility and flow on color Doppler imaging.

Other Findings: Ill-defined area of shadowing noted popliteal fossa
in region of calcific changes noted in this region. This region of
prior surgery for a sarcoma removal. If symptoms persist
gadolinium-enhanced MRI of this region can be performed
IMPRESSION: 1. No evidence of deep venous thrombosis.

2. Postsurgical change with probable dystrophic calcification
popliteal fossa. If symptoms persist MRI of this region with
gadolinium enhancement suggested given the patient's history of
prior sarcoma in this region.

## 2017-11-13 ENCOUNTER — Encounter: Payer: Self-pay | Admitting: Family Medicine

## 2017-11-13 DIAGNOSIS — F419 Anxiety disorder, unspecified: Secondary | ICD-10-CM

## 2017-11-23 ENCOUNTER — Telehealth: Payer: Self-pay | Admitting: Family Medicine

## 2017-11-23 DIAGNOSIS — F419 Anxiety disorder, unspecified: Secondary | ICD-10-CM

## 2017-11-23 MED ORDER — ESCITALOPRAM OXALATE 10 MG PO TABS: 10.0000 mg | ORAL_TABLET | Freq: Every day | ORAL | 5 refills | Status: DC

## 2017-11-23 NOTE — Telephone Encounter (Signed)
I've sent a new prescription for 10mg  tablets once a day to [facility]. Please let me know if you have any problems with this prescription.   Dr. Caryn Section

## 2017-11-23 NOTE — Telephone Encounter (Signed)
Changed sig and new prescription escitalopram sent to [facility] per Mychart request

## 2018-02-20 ENCOUNTER — Encounter: Payer: Self-pay | Admitting: Family Medicine

## 2018-02-20 ENCOUNTER — Ambulatory Visit: Payer: BLUE CROSS/BLUE SHIELD | Admitting: Family Medicine

## 2018-02-20 VITALS — BP 150/92 | HR 105 | Temp 98.6°F | Resp 16 | Wt 275.0 lb

## 2018-02-20 DIAGNOSIS — L309 Dermatitis, unspecified: Secondary | ICD-10-CM | POA: Diagnosis not present

## 2018-02-20 DIAGNOSIS — R600 Localized edema: Secondary | ICD-10-CM

## 2018-02-20 DIAGNOSIS — F419 Anxiety disorder, unspecified: Secondary | ICD-10-CM

## 2018-02-20 MED ORDER — TRIAMCINOLONE ACETONIDE 0.5 % EX OINT: 1.0000 "application " | TOPICAL_OINTMENT | Freq: Two times a day (BID) | CUTANEOUS | 1 refills | Status: DC

## 2018-02-20 NOTE — Progress Notes (Signed)
Patient: Elijah Mora Male    DOB: Feb 21, 1971   47 y.o.   MRN: [id number] Visit Date: 02/20/2018  Today's Provider: Lelon Huh, MD   Chief Complaint  Patient presents with  . Rash    x several months   Subjective:    Rash  This is a recurrent problem. The problem has been gradually worsening since onset. The affected locations include the right lower leg. The rash is characterized by scaling, swelling and draining (occasionally has an ulcer). Pertinent negatives include no fever, shortness of breath or vomiting. Treatments tried: antibiotic cream and A&D ointment. The treatment provided mild relief.   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 Has since had swelling of right leg and wears 32mm compression stocking to keep swelling down. He states he gets quite a bit of relief when he elevates leg at home in the evening, but doesn't get much chance to do so at home. He has tried 37mm compression stocking, but were too tight to get on. He does have follow up with vascular next month.   He also is here to follow up on anxiety, he was having some palpitation so was increased from 5 to 10mg  escitalopram since last visit and states he is doing much better. Feels that medication is working very well.   No flowsheet data found.      Current Outpatient Medications:  .  aspirin 81 MG tablet, Take 81 mg by mouth daily., Disp: , Rfl:  .  atorvastatin (LIPITOR) 40 MG tablet, Take by mouth., Disp: , Rfl:  .  cetirizine (ZYRTEC) 10 MG tablet, Take 10 mg by mouth daily., Disp: , Rfl:  .  escitalopram (LEXAPRO) 10 MG tablet, Take 1 tablet (10 mg total) by mouth daily., Disp: 30 tablet, Rfl: 5 .  ibuprofen (ADVIL,MOTRIN) 800 MG tablet, Take 1 tablet (800 mg total) by mouth every 8 (eight) hours as needed., Disp: 90 tablet, Rfl: 0  Review of Systems  Constitutional: Negative for appetite change, chills and fever.  Respiratory: Negative  for chest tightness, shortness of breath and wheezing.   Cardiovascular: Negative for chest pain and palpitations.  Gastrointestinal: Negative for abdominal pain, nausea and vomiting.  Skin: Positive for rash.    Social History   Tobacco Use  . Smoking status: Never Smoker  . Smokeless tobacco: Never Used  Substance Use Topics  . Alcohol use: Yes    Alcohol/week: 2.4 oz    Types: 4 Cans of beer per week    Comment: occasional use   Objective:   BP (!) 150/92 (BP Location: Left Arm, Patient Position: Sitting, Cuff Size: Large)   Pulse (!) 105   Temp 98.6 F (37 C) (Oral)   Resp 16   Wt 275 lb (124.7 kg)   SpO2 97% Comment: room air  BMI 39.46 kg/m  Vitals:   02/20/18 1602  BP: (!) 150/92  Pulse: (!) 105  Resp: 16  Temp: 98.6 F (37 C)  TempSrc: Oral  SpO2: 97%  Weight: 275 lb (124.7 kg)     Physical Exam  General appearance: alert, well developed, well nourished, cooperative and in no distress Head: Normocephalic, without obvious abnormality, atraumatic Respiratory: Respirations even and unlabored, normal respiratory rate Extremities: 3+ pitting edema of right lower leg to knee. Skin is dry and flaky with a few scabbed lesions. No erythema.     Office Visit from 02/20/2018 in [facility]  Practice  PHQ-2 Total Score  0         Assessment & Plan:     1. Localized edema Secondary to prior vascular condition.. Counseled to elevate as frequently as possible. Continue compression stocking. He does have follow up with vascular surgery next mont  2. Eczema, unspecified type Main complaint is itching. Will try triamcinolone ointment. Advised not use near sores.   3. Anxiety Doing well with escitalopram which is continued today.        Lelon Huh, MD  [facility]

## 2018-04-11 DIAGNOSIS — Z48812 Encounter for surgical aftercare following surgery on the circulatory system: Secondary | ICD-10-CM | POA: Diagnosis not present

## 2018-04-11 DIAGNOSIS — I776 Arteritis, unspecified: Secondary | ICD-10-CM | POA: Diagnosis not present

## 2018-05-25 ENCOUNTER — Ambulatory Visit: Payer: BLUE CROSS/BLUE SHIELD | Admitting: Family Medicine

## 2018-05-25 ENCOUNTER — Encounter: Payer: Self-pay | Admitting: Family Medicine

## 2018-05-25 DIAGNOSIS — M5441 Lumbago with sciatica, right side: Secondary | ICD-10-CM

## 2018-05-25 MED ORDER — METAXALONE 800 MG PO TABS: 800.0000 mg | ORAL_TABLET | Freq: Three times a day (TID) | ORAL | 0 refills | Status: DC

## 2018-05-25 NOTE — Progress Notes (Signed)
Patient: Elijah Mora Male    DOB: 1971-06-10   47 y.o.   MRN: [id number] Visit Date: 05/25/2018  Today's Provider: Lelon Huh, MD   Chief Complaint  Patient presents with  . Back Pain   Subjective:    Back Pain  This is a new problem. Episode onset: 5 days ago. The problem occurs constantly. The problem is unchanged. The pain is present in the lumbar spine (left side). The quality of the pain is described as aching (sharp pain whenever patient moves). Radiates to: left leg down to his left knee. Exacerbated by: movement. Pertinent negatives include no abdominal pain, chest pain, fever, numbness or weakness. He has tried NSAIDs (Ibuprofen) for the symptoms. The treatment provided mild relief.  Has had similar symptoms in the past which responded well to metaxalone.      No Known Allergies   Current Outpatient Medications:  .  aspirin 81 MG tablet, Take 81 mg by mouth daily., Disp: , Rfl:  .  atorvastatin (LIPITOR) 40 MG tablet, Take by mouth., Disp: , Rfl:  .  cetirizine (ZYRTEC) 10 MG tablet, Take 10 mg by mouth daily., Disp: , Rfl:  .  escitalopram (LEXAPRO) 10 MG tablet, Take 1 tablet (10 mg total) by mouth daily., Disp: 30 tablet, Rfl: 5 .  ibuprofen (ADVIL,MOTRIN) 800 MG tablet, Take 1 tablet (800 mg total) by mouth every 8 (eight) hours as needed., Disp: 90 tablet, Rfl: 0 .  triamcinolone ointment (KENALOG) 0.5 %, Apply 1 application topically 2 (two) times daily., Disp: 60 g, Rfl: 1  Review of Systems  Constitutional: Negative for appetite change, chills and fever.  Respiratory: Negative for chest tightness, shortness of breath and wheezing.   Cardiovascular: Negative for chest pain and palpitations.  Gastrointestinal: Negative for abdominal pain, nausea and vomiting.  Musculoskeletal: Positive for arthralgias (left knee pain), back pain and myalgias (left leg pain).  Neurological: Negative for weakness and numbness.    Social History   Tobacco Use  .  Smoking status: Never Smoker  . Smokeless tobacco: Never Used  Substance Use Topics  . Alcohol use: Yes    Alcohol/week: 4.0 standard drinks    Types: 4 Cans of beer per week    Comment: occasional use   Objective:   BP (!) 148/93 (BP Location: Left Arm, Patient Position: Sitting, Cuff Size: Large)   Pulse 91   Temp 98.5 F (36.9 C) (Oral)   Resp 16   Wt 272 lb (123.4 kg)   SpO2 97% Comment: room air  BMI 39.03 kg/m  Vitals:   05/25/18 1515  BP: (!) 148/93  Pulse: 91  Resp: 16  Temp: 98.5 F (36.9 C)  TempSrc: Oral  SpO2: 97%  Weight: 272 lb (123.4 kg)     Physical Exam  General appearance: alert, well developed, well nourished, cooperative and in no distress Head: Normocephalic, without obvious abnormality, atraumatic Respiratory: Respirations even and unlabored, normal respiratory rate MS: tender left para lumbar muscles  With palpation triggering pain to radiate into left posterior leg.      Assessment & Plan:     1. Right-sided low back pain with right-sided sciatica Continue OTC ibuprofen and add- metaxalone (SKELAXIN) 800 MG tablet; Take 1 tablet (800 mg total) by mouth 3 (three) times daily.  Dispense: 30 tablet; Refill: 0  Recommend application  Ice 2-3 times a day until spasms resolve, then alternate heat and ice.        Elenore Rota  Caryn Section, MD  [facility]

## 2018-05-26 ENCOUNTER — Other Ambulatory Visit: Payer: Self-pay | Admitting: Family Medicine

## 2018-05-26 DIAGNOSIS — F419 Anxiety disorder, unspecified: Secondary | ICD-10-CM

## 2018-06-05 ENCOUNTER — Other Ambulatory Visit: Payer: Self-pay | Admitting: Family Medicine

## 2018-06-05 DIAGNOSIS — M5441 Lumbago with sciatica, right side: Secondary | ICD-10-CM

## 2018-06-05 DIAGNOSIS — G4733 Obstructive sleep apnea (adult) (pediatric): Secondary | ICD-10-CM | POA: Diagnosis not present

## 2018-06-05 MED ORDER — METAXALONE 800 MG PO TABS: 800.0000 mg | ORAL_TABLET | Freq: Three times a day (TID) | ORAL | 1 refills | Status: DC

## 2018-06-05 NOTE — Telephone Encounter (Signed)
Pt contacted office for refill request on the following medications:  metaxalone (SKELAXIN) 800 MG tablet  [facility] [address]  Last Rx: 05/25/18 LOV: 05/25/18 Please advise. Thanks TNP

## 2018-09-26 DIAGNOSIS — J0100 Acute maxillary sinusitis, unspecified: Secondary | ICD-10-CM | POA: Diagnosis not present

## 2018-09-26 DIAGNOSIS — H1033 Unspecified acute conjunctivitis, bilateral: Secondary | ICD-10-CM | POA: Diagnosis not present

## 2018-11-27 DIAGNOSIS — M9903 Segmental and somatic dysfunction of lumbar region: Secondary | ICD-10-CM | POA: Diagnosis not present

## 2018-11-27 DIAGNOSIS — M9902 Segmental and somatic dysfunction of thoracic region: Secondary | ICD-10-CM | POA: Diagnosis not present

## 2018-11-27 DIAGNOSIS — M5414 Radiculopathy, thoracic region: Secondary | ICD-10-CM | POA: Diagnosis not present

## 2018-11-27 DIAGNOSIS — M5431 Sciatica, right side: Secondary | ICD-10-CM | POA: Diagnosis not present

## 2018-11-28 DIAGNOSIS — M9903 Segmental and somatic dysfunction of lumbar region: Secondary | ICD-10-CM | POA: Diagnosis not present

## 2018-11-28 DIAGNOSIS — M9902 Segmental and somatic dysfunction of thoracic region: Secondary | ICD-10-CM | POA: Diagnosis not present

## 2018-11-28 DIAGNOSIS — M5431 Sciatica, right side: Secondary | ICD-10-CM | POA: Diagnosis not present

## 2018-11-28 DIAGNOSIS — M5414 Radiculopathy, thoracic region: Secondary | ICD-10-CM | POA: Diagnosis not present

## 2018-11-29 DIAGNOSIS — M5414 Radiculopathy, thoracic region: Secondary | ICD-10-CM | POA: Diagnosis not present

## 2018-11-29 DIAGNOSIS — M9903 Segmental and somatic dysfunction of lumbar region: Secondary | ICD-10-CM | POA: Diagnosis not present

## 2018-11-29 DIAGNOSIS — M5431 Sciatica, right side: Secondary | ICD-10-CM | POA: Diagnosis not present

## 2018-11-29 DIAGNOSIS — M9902 Segmental and somatic dysfunction of thoracic region: Secondary | ICD-10-CM | POA: Diagnosis not present

## 2018-12-04 DIAGNOSIS — M9902 Segmental and somatic dysfunction of thoracic region: Secondary | ICD-10-CM | POA: Diagnosis not present

## 2018-12-04 DIAGNOSIS — M5431 Sciatica, right side: Secondary | ICD-10-CM | POA: Diagnosis not present

## 2018-12-04 DIAGNOSIS — M5414 Radiculopathy, thoracic region: Secondary | ICD-10-CM | POA: Diagnosis not present

## 2018-12-04 DIAGNOSIS — M9903 Segmental and somatic dysfunction of lumbar region: Secondary | ICD-10-CM | POA: Diagnosis not present

## 2018-12-05 DIAGNOSIS — M5431 Sciatica, right side: Secondary | ICD-10-CM | POA: Diagnosis not present

## 2018-12-05 DIAGNOSIS — M9902 Segmental and somatic dysfunction of thoracic region: Secondary | ICD-10-CM | POA: Diagnosis not present

## 2018-12-05 DIAGNOSIS — M9903 Segmental and somatic dysfunction of lumbar region: Secondary | ICD-10-CM | POA: Diagnosis not present

## 2018-12-05 DIAGNOSIS — M5414 Radiculopathy, thoracic region: Secondary | ICD-10-CM | POA: Diagnosis not present

## 2018-12-06 DIAGNOSIS — M9903 Segmental and somatic dysfunction of lumbar region: Secondary | ICD-10-CM | POA: Diagnosis not present

## 2018-12-06 DIAGNOSIS — M5416 Radiculopathy, lumbar region: Secondary | ICD-10-CM | POA: Diagnosis not present

## 2018-12-06 DIAGNOSIS — M9902 Segmental and somatic dysfunction of thoracic region: Secondary | ICD-10-CM | POA: Diagnosis not present

## 2018-12-06 DIAGNOSIS — M5414 Radiculopathy, thoracic region: Secondary | ICD-10-CM | POA: Diagnosis not present

## 2018-12-06 DIAGNOSIS — M5431 Sciatica, right side: Secondary | ICD-10-CM | POA: Diagnosis not present

## 2018-12-10 DIAGNOSIS — M9903 Segmental and somatic dysfunction of lumbar region: Secondary | ICD-10-CM | POA: Diagnosis not present

## 2018-12-10 DIAGNOSIS — M5431 Sciatica, right side: Secondary | ICD-10-CM | POA: Diagnosis not present

## 2018-12-10 DIAGNOSIS — M5414 Radiculopathy, thoracic region: Secondary | ICD-10-CM | POA: Diagnosis not present

## 2018-12-10 DIAGNOSIS — M9902 Segmental and somatic dysfunction of thoracic region: Secondary | ICD-10-CM | POA: Diagnosis not present

## 2018-12-12 DIAGNOSIS — M9903 Segmental and somatic dysfunction of lumbar region: Secondary | ICD-10-CM | POA: Diagnosis not present

## 2018-12-12 DIAGNOSIS — M9902 Segmental and somatic dysfunction of thoracic region: Secondary | ICD-10-CM | POA: Diagnosis not present

## 2018-12-12 DIAGNOSIS — M5414 Radiculopathy, thoracic region: Secondary | ICD-10-CM | POA: Diagnosis not present

## 2018-12-12 DIAGNOSIS — M5431 Sciatica, right side: Secondary | ICD-10-CM | POA: Diagnosis not present

## 2018-12-13 DIAGNOSIS — M9903 Segmental and somatic dysfunction of lumbar region: Secondary | ICD-10-CM | POA: Diagnosis not present

## 2018-12-13 DIAGNOSIS — M9902 Segmental and somatic dysfunction of thoracic region: Secondary | ICD-10-CM | POA: Diagnosis not present

## 2018-12-13 DIAGNOSIS — M5414 Radiculopathy, thoracic region: Secondary | ICD-10-CM | POA: Diagnosis not present

## 2018-12-13 DIAGNOSIS — M5431 Sciatica, right side: Secondary | ICD-10-CM | POA: Diagnosis not present

## 2018-12-15 DIAGNOSIS — M5414 Radiculopathy, thoracic region: Secondary | ICD-10-CM | POA: Diagnosis not present

## 2018-12-15 DIAGNOSIS — M9902 Segmental and somatic dysfunction of thoracic region: Secondary | ICD-10-CM | POA: Diagnosis not present

## 2018-12-15 DIAGNOSIS — M5431 Sciatica, right side: Secondary | ICD-10-CM | POA: Diagnosis not present

## 2018-12-15 DIAGNOSIS — M9903 Segmental and somatic dysfunction of lumbar region: Secondary | ICD-10-CM | POA: Diagnosis not present

## 2018-12-17 DIAGNOSIS — M9902 Segmental and somatic dysfunction of thoracic region: Secondary | ICD-10-CM | POA: Diagnosis not present

## 2018-12-17 DIAGNOSIS — M5414 Radiculopathy, thoracic region: Secondary | ICD-10-CM | POA: Diagnosis not present

## 2018-12-17 DIAGNOSIS — M9903 Segmental and somatic dysfunction of lumbar region: Secondary | ICD-10-CM | POA: Diagnosis not present

## 2018-12-17 DIAGNOSIS — M5431 Sciatica, right side: Secondary | ICD-10-CM | POA: Diagnosis not present

## 2018-12-19 DIAGNOSIS — M5431 Sciatica, right side: Secondary | ICD-10-CM | POA: Diagnosis not present

## 2018-12-19 DIAGNOSIS — M5414 Radiculopathy, thoracic region: Secondary | ICD-10-CM | POA: Diagnosis not present

## 2018-12-19 DIAGNOSIS — M9903 Segmental and somatic dysfunction of lumbar region: Secondary | ICD-10-CM | POA: Diagnosis not present

## 2018-12-19 DIAGNOSIS — M9902 Segmental and somatic dysfunction of thoracic region: Secondary | ICD-10-CM | POA: Diagnosis not present

## 2018-12-20 DIAGNOSIS — M9902 Segmental and somatic dysfunction of thoracic region: Secondary | ICD-10-CM | POA: Diagnosis not present

## 2018-12-20 DIAGNOSIS — M5414 Radiculopathy, thoracic region: Secondary | ICD-10-CM | POA: Diagnosis not present

## 2018-12-20 DIAGNOSIS — M5431 Sciatica, right side: Secondary | ICD-10-CM | POA: Diagnosis not present

## 2018-12-20 DIAGNOSIS — M9903 Segmental and somatic dysfunction of lumbar region: Secondary | ICD-10-CM | POA: Diagnosis not present

## 2018-12-24 DIAGNOSIS — M5414 Radiculopathy, thoracic region: Secondary | ICD-10-CM | POA: Diagnosis not present

## 2018-12-24 DIAGNOSIS — M9902 Segmental and somatic dysfunction of thoracic region: Secondary | ICD-10-CM | POA: Diagnosis not present

## 2018-12-24 DIAGNOSIS — M9903 Segmental and somatic dysfunction of lumbar region: Secondary | ICD-10-CM | POA: Diagnosis not present

## 2018-12-24 DIAGNOSIS — M5431 Sciatica, right side: Secondary | ICD-10-CM | POA: Diagnosis not present

## 2018-12-27 DIAGNOSIS — M9902 Segmental and somatic dysfunction of thoracic region: Secondary | ICD-10-CM | POA: Diagnosis not present

## 2018-12-27 DIAGNOSIS — M5431 Sciatica, right side: Secondary | ICD-10-CM | POA: Diagnosis not present

## 2018-12-27 DIAGNOSIS — M5414 Radiculopathy, thoracic region: Secondary | ICD-10-CM | POA: Diagnosis not present

## 2018-12-27 DIAGNOSIS — M9903 Segmental and somatic dysfunction of lumbar region: Secondary | ICD-10-CM | POA: Diagnosis not present

## 2019-01-01 DIAGNOSIS — M5414 Radiculopathy, thoracic region: Secondary | ICD-10-CM | POA: Diagnosis not present

## 2019-01-01 DIAGNOSIS — M9902 Segmental and somatic dysfunction of thoracic region: Secondary | ICD-10-CM | POA: Diagnosis not present

## 2019-01-01 DIAGNOSIS — M5431 Sciatica, right side: Secondary | ICD-10-CM | POA: Diagnosis not present

## 2019-01-01 DIAGNOSIS — M9903 Segmental and somatic dysfunction of lumbar region: Secondary | ICD-10-CM | POA: Diagnosis not present

## 2019-01-03 DIAGNOSIS — M9902 Segmental and somatic dysfunction of thoracic region: Secondary | ICD-10-CM | POA: Diagnosis not present

## 2019-01-03 DIAGNOSIS — M9903 Segmental and somatic dysfunction of lumbar region: Secondary | ICD-10-CM | POA: Diagnosis not present

## 2019-01-03 DIAGNOSIS — M5431 Sciatica, right side: Secondary | ICD-10-CM | POA: Diagnosis not present

## 2019-01-03 DIAGNOSIS — M5414 Radiculopathy, thoracic region: Secondary | ICD-10-CM | POA: Diagnosis not present

## 2019-01-09 DIAGNOSIS — M9903 Segmental and somatic dysfunction of lumbar region: Secondary | ICD-10-CM | POA: Diagnosis not present

## 2019-01-09 DIAGNOSIS — M5414 Radiculopathy, thoracic region: Secondary | ICD-10-CM | POA: Diagnosis not present

## 2019-01-09 DIAGNOSIS — M5431 Sciatica, right side: Secondary | ICD-10-CM | POA: Diagnosis not present

## 2019-01-09 DIAGNOSIS — M9902 Segmental and somatic dysfunction of thoracic region: Secondary | ICD-10-CM | POA: Diagnosis not present

## 2019-01-16 DIAGNOSIS — M9902 Segmental and somatic dysfunction of thoracic region: Secondary | ICD-10-CM | POA: Diagnosis not present

## 2019-01-16 DIAGNOSIS — M5414 Radiculopathy, thoracic region: Secondary | ICD-10-CM | POA: Diagnosis not present

## 2019-01-16 DIAGNOSIS — M9903 Segmental and somatic dysfunction of lumbar region: Secondary | ICD-10-CM | POA: Diagnosis not present

## 2019-01-16 DIAGNOSIS — M5431 Sciatica, right side: Secondary | ICD-10-CM | POA: Diagnosis not present

## 2019-01-23 DIAGNOSIS — M5414 Radiculopathy, thoracic region: Secondary | ICD-10-CM | POA: Diagnosis not present

## 2019-01-23 DIAGNOSIS — M5431 Sciatica, right side: Secondary | ICD-10-CM | POA: Diagnosis not present

## 2019-01-23 DIAGNOSIS — M9902 Segmental and somatic dysfunction of thoracic region: Secondary | ICD-10-CM | POA: Diagnosis not present

## 2019-01-23 DIAGNOSIS — M9903 Segmental and somatic dysfunction of lumbar region: Secondary | ICD-10-CM | POA: Diagnosis not present

## 2019-01-30 DIAGNOSIS — M5414 Radiculopathy, thoracic region: Secondary | ICD-10-CM | POA: Diagnosis not present

## 2019-01-30 DIAGNOSIS — M5431 Sciatica, right side: Secondary | ICD-10-CM | POA: Diagnosis not present

## 2019-01-30 DIAGNOSIS — M9903 Segmental and somatic dysfunction of lumbar region: Secondary | ICD-10-CM | POA: Diagnosis not present

## 2019-01-30 DIAGNOSIS — M9902 Segmental and somatic dysfunction of thoracic region: Secondary | ICD-10-CM | POA: Diagnosis not present

## 2019-02-07 ENCOUNTER — Telehealth: Payer: BLUE CROSS/BLUE SHIELD | Admitting: Physician Assistant

## 2019-02-07 ENCOUNTER — Other Ambulatory Visit: Payer: Self-pay | Admitting: Family Medicine

## 2019-02-07 DIAGNOSIS — L298 Other pruritus: Secondary | ICD-10-CM

## 2019-02-07 DIAGNOSIS — L309 Dermatitis, unspecified: Secondary | ICD-10-CM

## 2019-02-07 NOTE — Progress Notes (Signed)
Hi Fateh,   We do not treat chronic conditions or refill chronic medications via e-visit. I would recommend that you call Dr. Maralyn Sago office and have them call in a refill of this medication for you since he is the prescribing provider and this issue requires recurrent treatment.     NOTE: If you entered your credit card information for this eVisit, you will not be charged. You may see a "hold" on your card for the $35 but that hold will drop off and you will not have a charge processed.  If you are having a true medical emergency please call 911.  If you need an urgent face to face visit, [facility] has four urgent care centers for your convenience.    PLEASE NOTE: THE [facility] LOCATIONS AND URGENT CARE CLINICS DO NOT HAVE THE TESTING FOR CORONAVIRUS COVID19 AVAILABLE.  IF YOU FEEL YOU NEED THIS TEST YOU MUST GO TO A TRIAGE LOCATION AT [facility]   [email or web address] to reserve your spot online an avoid wait times  [facility] [address], [address] [place], [place] [address] Modified hours of operation: Monday-Friday, 12 PM to 6 PM  Saturday & Sunday 10 AM to 4 PM *Across the street from [place] (New Address!) [address] [place], [place] [address] *Just off [address], across the road from [place] hours of operation: Monday-Friday, 12 PM to 6 PM  Closed Saturday & Sunday  [facility]'s modified hours of operation will be in effect from May 1 until May 31   The following sites will take your insurance:  . [facility] [place] a Provider at this Location  [address] [place], [place] [address] . 10 am to 8 pm Monday-Friday . 12 pm to 8 pm Saturday-Sunday   . [facility] at [place] a Provider at this Location  [address] [place], [place], [place] [address] . 8 am to 8 pm Monday-Friday . 9 am to 6 pm Saturday . 11 am to 6 pm Sunday   . [facility] at [place] Get Driving Directions  [address] [place], [place] [address] . 8 am to 8 pm Monday-Friday . 8 am to 4 pm Saturday-Sunday   Your e-visit answers were reviewed by a board certified advanced clinical practitioner to complete your personal care plan.  Thank you for using e-Visits.

## 2019-02-07 NOTE — Telephone Encounter (Signed)
Pt needs refill on  Kenalog generic ointment  CB#  [phone]  [facility]  [place]

## 2019-02-08 MED ORDER — TRIAMCINOLONE ACETONIDE 0.5 % EX OINT: 1.0000 "application " | TOPICAL_OINTMENT | Freq: Two times a day (BID) | CUTANEOUS | 3 refills | Status: DC

## 2019-02-08 NOTE — Telephone Encounter (Signed)
Please review. Thanks!  

## 2019-02-13 DIAGNOSIS — M5414 Radiculopathy, thoracic region: Secondary | ICD-10-CM | POA: Diagnosis not present

## 2019-02-13 DIAGNOSIS — M9902 Segmental and somatic dysfunction of thoracic region: Secondary | ICD-10-CM | POA: Diagnosis not present

## 2019-02-13 DIAGNOSIS — M5431 Sciatica, right side: Secondary | ICD-10-CM | POA: Diagnosis not present

## 2019-02-13 DIAGNOSIS — M9903 Segmental and somatic dysfunction of lumbar region: Secondary | ICD-10-CM | POA: Diagnosis not present

## 2019-02-27 DIAGNOSIS — M5414 Radiculopathy, thoracic region: Secondary | ICD-10-CM | POA: Diagnosis not present

## 2019-02-27 DIAGNOSIS — M9902 Segmental and somatic dysfunction of thoracic region: Secondary | ICD-10-CM | POA: Diagnosis not present

## 2019-02-27 DIAGNOSIS — M9903 Segmental and somatic dysfunction of lumbar region: Secondary | ICD-10-CM | POA: Diagnosis not present

## 2019-02-27 DIAGNOSIS — M5431 Sciatica, right side: Secondary | ICD-10-CM | POA: Diagnosis not present

## 2019-04-11 DIAGNOSIS — G4733 Obstructive sleep apnea (adult) (pediatric): Secondary | ICD-10-CM | POA: Diagnosis not present

## 2019-04-18 DIAGNOSIS — T8149XA Infection following a procedure, other surgical site, initial encounter: Secondary | ICD-10-CM | POA: Diagnosis not present

## 2019-04-18 DIAGNOSIS — I776 Arteritis, unspecified: Secondary | ICD-10-CM | POA: Diagnosis not present

## 2019-04-18 DIAGNOSIS — Z48812 Encounter for surgical aftercare following surgery on the circulatory system: Secondary | ICD-10-CM | POA: Diagnosis not present

## 2019-06-15 ENCOUNTER — Other Ambulatory Visit: Payer: Self-pay | Admitting: Family Medicine

## 2019-06-15 DIAGNOSIS — F419 Anxiety disorder, unspecified: Secondary | ICD-10-CM

## 2019-06-24 ENCOUNTER — Other Ambulatory Visit: Payer: Self-pay | Admitting: Family Medicine

## 2019-06-24 DIAGNOSIS — F419 Anxiety disorder, unspecified: Secondary | ICD-10-CM

## 2019-07-12 ENCOUNTER — Other Ambulatory Visit: Payer: Self-pay

## 2019-07-12 ENCOUNTER — Ambulatory Visit: Payer: Self-pay

## 2019-07-12 DIAGNOSIS — Z23 Encounter for immunization: Secondary | ICD-10-CM

## 2019-09-12 ENCOUNTER — Other Ambulatory Visit: Payer: Self-pay | Admitting: Family Medicine

## 2019-09-12 DIAGNOSIS — F419 Anxiety disorder, unspecified: Secondary | ICD-10-CM

## 2019-09-12 NOTE — Telephone Encounter (Signed)
I called and advised patient that he needs to schedule follow up visit. Follow up appointment scheduled 09/16/2019 at 2pm. Patient needs a refill. He only has 4 pills left. Please advise if ok to refill.

## 2019-09-12 NOTE — Telephone Encounter (Signed)
Requested medication (s) are due for refill today: yes  Requested medication (s) are on the active medication list: yes  Last refill:  08/15/2019  Future visit scheduled:no  Notes to clinic:  LOV-05/25/2018 Review for refill   Requested Prescriptions  Pending Prescriptions Disp Refills   escitalopram (LEXAPRO) 10 MG tablet [Pharmacy Med Name: ESCITALOPRAM 10 MG TABLET] 30 tablet 0    Sig: TAKE 1 TABLET BY [place]      Psychiatry:  Antidepressants - SSRI Failed - 09/12/2019  4:56 AM      Failed - Valid encounter within last 6 months    Recent Outpatient Visits           1 year ago Right-sided low back pain with right-sided sciatica   [facility] Birdie Sons, MD   1 year ago Localized edema   [facility] Birdie Sons, MD   2 years ago Hematuria, unspecified type   [facility] Birdie Sons, MD   3 years ago Acute anxiety   [facility] Birdie Sons, MD   3 years ago Right-sided low back pain with right-sided sciatica   [facility] [place], [place], [place]

## 2019-09-16 ENCOUNTER — Encounter: Payer: Self-pay | Admitting: Family Medicine

## 2019-09-16 ENCOUNTER — Ambulatory Visit: Payer: BC Managed Care – PPO | Admitting: Family Medicine

## 2019-09-16 ENCOUNTER — Other Ambulatory Visit: Payer: Self-pay

## 2019-09-16 VITALS — BP 148/94 | HR 87 | Temp 96.8°F | Ht 70.0 in | Wt 263.0 lb

## 2019-09-16 DIAGNOSIS — Z136 Encounter for screening for cardiovascular disorders: Secondary | ICD-10-CM | POA: Diagnosis not present

## 2019-09-16 DIAGNOSIS — E669 Obesity, unspecified: Secondary | ICD-10-CM | POA: Diagnosis not present

## 2019-09-16 DIAGNOSIS — R030 Elevated blood-pressure reading, without diagnosis of hypertension: Secondary | ICD-10-CM | POA: Diagnosis not present

## 2019-09-16 DIAGNOSIS — F419 Anxiety disorder, unspecified: Secondary | ICD-10-CM

## 2019-09-16 NOTE — Progress Notes (Signed)
Patient: Elijah Mora Male    DOB: Mar 12, 1971   48 y.o.   MRN: [id number] Visit Date: 09/16/2019  Today's Provider: Lelon Huh, MD   Chief Complaint  Patient presents with  . Anxiety   Subjective:     HPI  Follow up for Anxiety:  The patient was last seen for this 1 years ago. Changes made at last visit include none; continue current medication.  He reports good compliance with treatment. He feels that condition is stable. He is not having side effects.   ------------------------------------------------------------------------------------ He also reports that he was prescribed atorvastatin by vascular surgeon when he had a femoral bypass in 2017. 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 He does not have a history of hyperlipemia and has been of atorvastatin since the beginning of this year.     No Known Allergies   Current Outpatient Medications:  .  aspirin 81 MG tablet, Take 81 mg by mouth daily., Disp: , Rfl:  .  cetirizine (ZYRTEC) 10 MG tablet, Take 10 mg by mouth daily., Disp: , Rfl:  .  escitalopram (LEXAPRO) 10 MG tablet, TAKE 1 TABLET BY MOUTH EVERY DAY, Disp: 30 tablet, Rfl: 1 .  fluticasone (FLONASE) 50 MCG/ACT nasal spray, Place 1 spray into both nostrils daily., Disp: , Rfl:  .  glucosamine-chondroitin 500-400 MG tablet, Take 1 tablet by mouth 2 (two) times daily., Disp: , Rfl:  .  ibuprofen (ADVIL,MOTRIN) 800 MG tablet, Take 1 tablet (800 mg total) by mouth every 8 (eight) hours as needed., Disp: 90 tablet, Rfl: 0 .  triamcinolone ointment (KENALOG) 0.5 %, Apply 1 application topically 2 (two) times daily., Disp: 60 g, Rfl: 3 .  atorvastatin (LIPITOR) 40 MG tablet, Take by mouth., Disp: , Rfl:   Review of Systems  Constitutional: Negative for appetite change, chills and fever.  Respiratory: Negative for chest tightness, shortness of breath and wheezing.    Cardiovascular: Negative for chest pain and palpitations.  Gastrointestinal: Negative for abdominal pain, nausea and vomiting.    Social History   Tobacco Use  . Smoking status: Never Smoker  . Smokeless tobacco: Never Used  Substance Use Topics  . Alcohol use: Yes    Alcohol/week: 4.0 standard drinks    Types: 4 Cans of beer per week    Comment: occasional use      Objective:   BP (!) 148/94 (BP Location: Right Arm, Patient Position: Sitting, Cuff Size: Large)   Pulse 87   Temp (!) 96.8 F (36 C) (Temporal)   Ht 5\' 10"  (1.778 m)   Wt 263 lb (119.3 kg)   SpO2 99%   BMI 37.74 kg/m  Vitals:   09/16/19 1404  BP: (!) 148/94  Pulse: 87  Temp: (!) 96.8 F (36 C)  TempSrc: Temporal  SpO2: 99%  Weight: 263 lb (119.3 kg)  Height: 5\' 10"  (1.778 m)  Body mass index is 37.74 kg/m.   Physical Exam  General appearance: Obese male, cooperative and in no acute distress Head: Normocephalic, without obvious abnormality, atraumatic Respiratory: Respirations even and unlabored, normal respiratory rate Extremities: All extremities are intact.  Skin: Skin color, texture, turgor normal. No rashes seen  Psych: Appropriate mood and affect. Neurologic: Mental status: Alert, oriented to person, place, and time, thought content appropriate.     Assessment & Plan    1. Anxiety disorder, unspecified type Very well controlled on current dose of escitalopram  2. Elevated blood pressure reading   3. Encounter for special screening examination for cardiovascular disorder No longer on atorvastatin, which was prescribed prophylactic ally after femoral graft in 2017.  - Comprehensive metabolic panel - Lipid panel (fasting) - TSH  4. Obesity (BMI 35.0-39.9 without comorbidity)  - Comprehensive metabolic panel - Lipid panel (fasting) - TSH  The entirety of the information documented in the History of Present Illness, Review of Systems and Physical Exam were personally obtained by  me. Portions of this information were initially documented by Idelle Jo, CMA and reviewed by me for thoroughness and accuracy.     Lelon Huh, MD  [facility]

## 2019-09-16 NOTE — Patient Instructions (Signed)
.   Please review the attached list of medications and notify my office if there are any errors.   . Please bring all of your medications to every appointment so we can make sure that our medication list is the same as yours.   

## 2019-09-17 ENCOUNTER — Telehealth: Payer: Self-pay

## 2019-09-17 LAB — LIPID PANEL
Chol/HDL Ratio: 7.4 ratio — ABNORMAL HIGH (ref 0.0–5.0)
Cholesterol, Total: 216 mg/dL — ABNORMAL HIGH (ref 100–199)
HDL: 29 mg/dL — ABNORMAL LOW (ref >39)
LDL Chol Calc (NIH): 87 mg/dL (ref 0–99)
Triglycerides: 612 mg/dL (ref 0–149)
VLDL Cholesterol Cal: 100 mg/dL — ABNORMAL HIGH (ref 5–40)

## 2019-09-17 LAB — COMPREHENSIVE METABOLIC PANEL
ALT: 36 IU/L (ref 0–44)
AST: 21 IU/L (ref 0–40)
Albumin/Globulin Ratio: 2 (ref 1.2–2.2)
Albumin: 4.7 g/dL (ref 4.0–5.0)
Alkaline Phosphatase: 102 IU/L (ref 39–117)
BUN/Creatinine Ratio: 16 (ref 9–20)
BUN: 14 mg/dL (ref 6–24)
Bilirubin Total: 0.6 mg/dL (ref 0.0–1.2)
CO2: 25 mmol/L (ref 20–29)
Calcium: 10 mg/dL (ref 8.7–10.2)
Chloride: 102 mmol/L (ref 96–106)
Creatinine, Ser: 0.88 mg/dL (ref 0.76–1.27)
GFR calc Af Amer: 119 mL/min/{1.73_m2} (ref >59)
GFR calc non Af Amer: 103 mL/min/{1.73_m2} (ref >59)
Globulin, Total: 2.4 g/dL (ref 1.5–4.5)
Glucose: 123 mg/dL — ABNORMAL HIGH (ref 65–99)
Potassium: 4.3 mmol/L (ref 3.5–5.2)
Sodium: 140 mmol/L (ref 134–144)
Total Protein: 7.1 g/dL (ref 6.0–8.5)

## 2019-09-17 LAB — TSH: TSH: 2.66 u[IU]/mL (ref 0.450–4.500)

## 2019-09-17 NOTE — Telephone Encounter (Signed)
LMTCB, okay for PEC to advise patient.  

## 2019-09-17 NOTE — Telephone Encounter (Signed)
-----   Message from Birdie Sons, MD sent at 09/17/2019  7:51 AM EST ----- Triglycerides are very high. Blood sugar is borderline for diabetes. Cholesterol is a little high.  Need to strictly avoid sweets and starchy foods. Exercise average of 30 minutes a day.  Need to schedule follow up in 3 months to check triglycerides and a1c.

## 2019-09-24 NOTE — Telephone Encounter (Signed)
LMTCB, okay for PEC to advise patient.  

## 2019-09-30 NOTE — Telephone Encounter (Signed)
Patient advised. Follow up appointment scheduled 01/07/2019 at 1:40pm.

## 2019-10-08 DIAGNOSIS — Z3009 Encounter for other general counseling and advice on contraception: Secondary | ICD-10-CM | POA: Diagnosis not present

## 2019-11-15 ENCOUNTER — Other Ambulatory Visit: Payer: Self-pay | Admitting: Family Medicine

## 2019-11-15 DIAGNOSIS — F419 Anxiety disorder, unspecified: Secondary | ICD-10-CM

## 2020-01-07 ENCOUNTER — Ambulatory Visit: Payer: Self-pay | Admitting: Family Medicine

## 2020-01-12 ENCOUNTER — Telehealth: Payer: BLUE CROSS/BLUE SHIELD | Admitting: Physician Assistant

## 2020-01-12 ENCOUNTER — Other Ambulatory Visit: Payer: Self-pay | Admitting: Family Medicine

## 2020-01-12 DIAGNOSIS — F419 Anxiety disorder, unspecified: Secondary | ICD-10-CM

## 2020-01-12 DIAGNOSIS — L03116 Cellulitis of left lower limb: Secondary | ICD-10-CM

## 2020-01-12 NOTE — Telephone Encounter (Signed)
Requested Prescriptions  Pending Prescriptions Disp Refills  . escitalopram (LEXAPRO) 10 MG tablet [Pharmacy Med Name: ESCITALOPRAM 10 MG TABLET] 90 tablet 0    Sig: TAKE 1 TABLET BY MOUTH EVERY DAY     Psychiatry:  Antidepressants - SSRI Passed - 01/12/2020  2:45 PM      Passed - Valid encounter within last 6 months    Recent Outpatient Visits          3 months ago Anxiety disorder, unspecified type   [facility] Birdie Sons, MD   1 year ago Right-sided low back pain with right-sided sciatica   [facility] Birdie Sons, MD   1 year ago Localized edema   [facility] Birdie Sons, MD   2 years ago Hematuria, unspecified type   [facility] Birdie Sons, MD   3 years ago Acute anxiety   [facility] Birdie Sons, MD      Future Appointments            In 3 weeks Fisher, Kirstie Peri, MD [facility], [place]

## 2020-01-12 NOTE — Progress Notes (Signed)
    Based on what you shared with me, I feel your condition warrants further evaluation and I recommend that you go  immediately to the closest emergency room. You have a very extensive infection that will require antibiotics and further in person work up. This needs to be evaluated in the emergency room given your elevated temperature. Without immediate medical attention this could be life threatening.     NOTE: If you entered your credit card information for this eVisit, you will not be charged. You may see a "hold" on your card for the $35 but that hold will drop off and you will not have a charge processed.   If you are having a true medical emergency please call 911.      For an urgent face to face visit, [place] has five urgent care centers for your convenience:      NEW:  [facility] [place] at [place] Get Driving Directions [id number] [place], [place] [address] . 10 am - 6pm Monday - Friday    [facility]) Get Driving Directions [id number] [address] [place], [place] [address] . 10 am to 8 pm Monday-Friday . 12 pm to 8 pm [facility] at [facility] Get Driving Directions [id number] [place], [place], [place] [address] . 8 am to 8 pm Monday-Friday . 9 am to 6 pm Saturday . 11 am to 6 pm Sunday     [facility] at [facility] Get Driving Directions  [id number] [address] [place], [place] [address] . 8 am to 8 pm Monday-Friday . 8 am to 4 pm [facility] at [place] Get Driving Directions [id number] [address] [place], [place] [address] . 12 pm to 6 pm Monday-Friday      Your e-visit answers were reviewed by a board certified advanced clinical practitioner to complete your personal care plan.  Thank you for using e-Visits.   Greater than 5  minutes, yet less than 10 minutes of time have been spent researching, coordinating, and implementing care for this patient today

## 2020-01-13 ENCOUNTER — Encounter: Payer: Self-pay | Admitting: Family Medicine

## 2020-01-13 ENCOUNTER — Telehealth: Payer: Self-pay | Admitting: Family Medicine

## 2020-01-13 NOTE — Telephone Encounter (Signed)
Non-Urgent Medical Question  Jules Schick, CMA routed conversation to You 4 hours ago (8:33 AM)  Elijah Mora "Brad"  You 4 hours ago (8:29 AM)   Saturday, I had fever, chills and noticed soreness in right leg when I touched it. The symptoms persisted throughout the day on Saturday. Fever typically was 99.5 - 101.5. One measurement read 103.9, but I had been under blankets with a heating pad trying to get relief from the chills, so I think that was an outlier. Sunday morning, as I was getting in the shower, I removed the compression appliance I typically wear and noticed that my leg was red from just above the knee to the ankle. I do have healing wound on my right shin. There were/are no blisters or pustules. I believe it is Cellulitis. I went to [facility] and the diagnosis of Cellulitis was determined. I was prescribed Dicloxacillin 500 mg, one capsule every six hours for seven days. No further fever or chills. No worsening of pain in leg and no discernible difference in redness. I do have concerns about the bacteria type, treatment and whether or not further evaluation is necessary.

## 2020-01-13 NOTE — Telephone Encounter (Signed)
No available appointment slots to open. Patient scheduled with Fabio Bering on 4/21

## 2020-01-13 NOTE — Telephone Encounter (Signed)
Please see telephone message 

## 2020-01-13 NOTE — Telephone Encounter (Signed)
Patient should come in in the next 2 or 3 days to check leg infection. He needs to stay on antibiotic until then. Ok to open any blocked slots for follow up.

## 2020-01-14 NOTE — Progress Notes (Signed)
Established patient visit    Patient: Elijah Mora   DOB: [birth year]   49 y.o. Male  MRN: [id number] Visit Date: 01/15/2020  Today's healthcare provider: Trinna Post, PA-C   Chief Complaint  Patient presents with  . Cellulitis  I,Faraaz Wolin M Nandana Krolikowski,acting as a scribe for Trinna Post, PA-C.,have documented all relevant documentation on the behalf of Trinna Post, PA-C,as directed by  Trinna Post, PA-C while in the presence of Trinna Post, PA-C.  Subjective    HPI  Cellulitis Patient was seen in urgent care on 01/12/2020 and was dx with cellulitis of his left leg. Patient was prescribed dicloxacillin 500 mg every 6 hours for 7 days and reports good compliance. Patient states that he is having some nausea after taking the medication. He reports that the medication is helping with the symptoms: fever,redness, soreness and chills. Patient have redness, wound, dry skin to the right lower leg. He is a chronic vascular patient and is followed by [facility]. He ha small wound on his anterior left calf.     Medications: Outpatient Medications Prior to Visit  Medication Sig  . aspirin 81 MG tablet Take 81 mg by mouth daily.  . cetirizine (ZYRTEC) 10 MG tablet Take 10 mg by mouth daily.  . dicloxacillin (DYNAPEN) 500 MG capsule Take 500 mg by mouth 4 (four) times daily.  Mar[place] Kitchen escitalopram (LEXAPRO) 10 MG tablet TAKE 1 TABLET BY MOUTH EVERY DAY  . fluticasone (FLONASE) 50 MCG/ACT nasal spray Place 1 spray into both nostrils daily.  Mar[place] Kitchen glucosamine-chondroitin 500-400 MG tablet Take 1 tablet by mouth 2 (two) times daily.  Mar[place] Kitchen ibuprofen (ADVIL,MOTRIN) 800 MG tablet Take 1 tablet (800 mg total) by mouth every 8 (eight) hours as needed.  . triamcinolone ointment (KENALOG) 0.5 % Apply 1 application topically 2 (two) times daily.   No facility-administered medications prior to visit.    Review of Systems     Objective    BP 138/84 (BP Location: Right Arm, Patient  Position: Sitting, Cuff Size: Normal)   Pulse 83   Temp (!) 97.3 F (36.3 C) (Temporal)   Wt 267 lb 12.8 oz (121.5 kg)   SpO2 99%   BMI 38.43 kg/m    Physical Exam Constitutional:      Appearance: Normal appearance.  Skin:    General: Skin is warm and dry.     Findings: Erythema and wound present. No bruising, lesion or rash.       Neurological:     General: No focal deficit present.     Mental Status: He is alert and oriented to person, place, and time.  Psychiatric:        Mood and Affect: Mood normal.        Behavior: Behavior normal.       No results found for any visits on 01/15/20.   Assessment & Plan    1. Cellulitis of left lower extremity Patient was prescribed Dicloxacillin 500 MG every 6 hours (4 times daily) for 7 days Patient have redness, wound, dry skin to the right lower leg. Improving. Continue antibiotics. May have chronic venous stasis rash that persists after resolution of cellulitis.   Return if symptoms worsen or fail to improve.      ITrinna Post, PA-C, have reviewed all documentation for this visit. The documentation on 01/15/20 for the exam, diagnosis, procedures, and orders are all accurate and complete.    Trinna Post, PA-C  [place] [phone] (phone) [phone] (fax)  [place]

## 2020-01-15 ENCOUNTER — Encounter: Payer: Self-pay | Admitting: Physician Assistant

## 2020-01-15 ENCOUNTER — Ambulatory Visit: Payer: BC Managed Care – PPO | Admitting: Physician Assistant

## 2020-01-15 ENCOUNTER — Other Ambulatory Visit: Payer: Self-pay

## 2020-01-15 VITALS — BP 138/84 | HR 83 | Temp 97.3°F | Wt 267.8 lb

## 2020-01-15 DIAGNOSIS — L03116 Cellulitis of left lower limb: Secondary | ICD-10-CM | POA: Diagnosis not present

## 2020-01-15 NOTE — Patient Instructions (Signed)

## 2020-01-31 ENCOUNTER — Other Ambulatory Visit: Payer: Self-pay

## 2020-01-31 ENCOUNTER — Encounter: Payer: Self-pay | Admitting: Family Medicine

## 2020-01-31 ENCOUNTER — Telehealth: Payer: Self-pay

## 2020-01-31 ENCOUNTER — Ambulatory Visit: Payer: BC Managed Care – PPO | Admitting: Family Medicine

## 2020-01-31 VITALS — BP 158/92 | HR 72 | Temp 97.3°F | Resp 18 | Wt 270.0 lb

## 2020-01-31 DIAGNOSIS — E781 Pure hyperglyceridemia: Secondary | ICD-10-CM | POA: Diagnosis not present

## 2020-01-31 DIAGNOSIS — R739 Hyperglycemia, unspecified: Secondary | ICD-10-CM

## 2020-01-31 DIAGNOSIS — L03116 Cellulitis of left lower limb: Secondary | ICD-10-CM | POA: Diagnosis not present

## 2020-01-31 MED ORDER — DICLOXACILLIN SODIUM 500 MG PO CAPS: 500.0000 mg | ORAL_CAPSULE | Freq: Four times a day (QID) | ORAL | 0 refills | Status: AC

## 2020-01-31 NOTE — Progress Notes (Signed)
I,Roshena L Chambers,acting as a scribe for Lelon Huh, MD.,have documented all relevant documentation on the behalf of Lelon Huh, MD,as directed by  Lelon Huh, MD while in the presence of Lelon Huh, MD.  Established patient visit   Patient: Elijah Mora   DOB: 1971/02/24   49 y.o. Male  MRN: [id number] Visit Date: 01/31/2020  Today's healthcare provider: Lelon Huh, MD   Chief Complaint  Patient presents with  . Cellulitis   Subjective    HPI  Cellulitis of right lower extremity: Patient was last seen in the office on 01/15/2020 by Carles Collet, PA-C for Cellulitis of the lower extremity. He was previously seen by an urgent care and prescribed Dicloxacillin 500 MG every 6 hours (4 times daily) for 7 days. During the visit with Adriana, patient was advised to continue antibiotics. Today patient comes in stating that he completed all doses of antibiotics 5 days ago, but the infection has not completely healed. There is some redness and swelling of the right lower leg that seemed to worsen 1-3 days ago. Patient denies any recent fevers, chills or sweats. There has been very little drainage from the wound. He has a past history of Cellulitis.  He reports the last infection before this one was over a year ago.       Medications: Outpatient Medications Prior to Visit  Medication Sig  . aspirin 81 MG tablet Take 81 mg by mouth daily.  . cetirizine (ZYRTEC) 10 MG tablet Take 10 mg by mouth daily.  Marland Kitchen escitalopram (LEXAPRO) 10 MG tablet TAKE 1 TABLET BY MOUTH EVERY DAY  . fluticasone (FLONASE) 50 MCG/ACT nasal spray Place 1 spray into both nostrils daily.  Marland Kitchen glucosamine-chondroitin 500-400 MG tablet Take 1 tablet by mouth 2 (two) times daily.  Marland Kitchen ibuprofen (ADVIL,MOTRIN) 800 MG tablet Take 1 tablet (800 mg total) by mouth every 8 (eight) hours as needed.  . triamcinolone ointment (KENALOG) 0.5 % Apply 1 application topically 2 (two) times daily.  . [DISCONTINUED]  dicloxacillin (DYNAPEN) 500 MG capsule Take 500 mg by mouth 4 (four) times daily.   No facility-administered medications prior to visit.    Review of Systems  Constitutional: Negative for appetite change, chills and fever.  Respiratory: Negative for chest tightness, shortness of breath and wheezing.   Cardiovascular: Positive for leg swelling (lower right leg). Negative for chest pain and palpitations.  Gastrointestinal: Negative for abdominal pain, nausea and vomiting.  Skin: Positive for color change (redness of the right lower leg) and wound. Negative for rash.      Objective    BP (!) 158/92 (BP Location: Right Arm, Cuff Size: Large)   Pulse 72   Temp (!) 97.3 F (36.3 C) (Temporal)   Resp 18   Wt 270 lb (122.5 kg)   SpO2 97% Comment: room air  BMI 38.74 kg/m    Physical Exam  Mildly inflamed along scabbed area of anterior right lower leg, no discharge. No red streaks.   No results found for any visits on 01/31/20.  Assessment & Plan     1. Cellulitis of left lower extremity Had nearly but not completely resolved with 7 days course of dicloxacillin originally prescribed at UC two weeks ago. Is starting to get more erythematous, not spreading up into knee and thigh as it was originally.  Will refill a 10 day course of Dynapen. Follow up in 1 week for recheck. Recommend taking a probiotic such as Align to help with GI issues  while on antibiotics.  - dicloxacillin (DYNAPEN) 500 MG capsule; Take 1 capsule (500 mg total) by mouth 4 (four) times daily for 10 days.  Dispense: 40 capsule; Refill: 0  2. Hyperglycemia Patient has an upcoming follow up appointment scheduled on 02/07/2020. Will order labs today and discuss results during the next visit.  - Lipid panel - Comprehensive Metabolic Panel (CMET) - HgB A1c  3. Hypertriglyceridemia Labs ordered to review during next appointment in 1 week.  - Lipid panel - Comprehensive Metabolic Panel (CMET)   No follow-ups on  file.      The entirety of the information documented in the History of Present Illness, Review of Systems and Physical Exam were personally obtained by me. Portions of this information were initially documented by the CMA and reviewed by me for thoroughness and accuracy.      Lelon Huh, MD  [facility] [phone] (phone) [phone] (fax)  [place]

## 2020-01-31 NOTE — Telephone Encounter (Signed)
Copied from [place] [id number]. Topic: Clinical - COVID Pre-Screen >> Jan 31, 2020  9:28 AM Erick Blinks wrote: 1. To the best of your knowledge, have you been in close contact with anyone with a confirmed diagnosis of COVID 19?  no  If no - Proceed to next question; If yes - Schedule patient for a virtual visit  2. Have you had any one or more of the following: fever, chills, cough, shortness of breath or any flu-like symptoms?  no  If no - Proceed to next question; If yes - Schedule patient for a virtual visit  3. Have you been diagnosed with or have a previous diagnosis of COVID 19?  no  If no - Proceed to next question; If yes - Schedule patient for a virtual visit  4. I am going to go over a few other symptoms with you. Please let me know if you are experiencing any of the following: no  Ear, nose or throat discomfort  A sore throat  Headache  Muscle pain  Diarrhea  Loss of taste or smell  If no - Continue with scheduling process; If yes - Document in scheduling notes   Thank you for answering these questions. Please know we will ask you these questions or similar questions when you arrive for your appointment and again it's how we are keeping everyone safe. Also, to keep you safe, please use the provided hand sanitizer when you enter the building. Elijah Mora, we are asking everyone in the building to wear a mask because they help [place] prevent the spread of germs.   Do you have a mask of your own, if not, we are happy to provide one for you. The last thing I want to go over with you is the no visitor guidelines. This means no one can attend the appointment with you unless you need physical assistance. I understand this may be different from your past appointments and I know this may be difficult but please know if someone is driving you we are happy to call them for you once your appointment is over.

## 2020-02-04 DIAGNOSIS — E781 Pure hyperglyceridemia: Secondary | ICD-10-CM | POA: Diagnosis not present

## 2020-02-04 DIAGNOSIS — R739 Hyperglycemia, unspecified: Secondary | ICD-10-CM | POA: Diagnosis not present

## 2020-02-05 ENCOUNTER — Telehealth: Payer: Self-pay

## 2020-02-05 DIAGNOSIS — E781 Pure hyperglyceridemia: Secondary | ICD-10-CM

## 2020-02-05 DIAGNOSIS — E119 Type 2 diabetes mellitus without complications: Secondary | ICD-10-CM

## 2020-02-05 LAB — LIPID PANEL
Chol/HDL Ratio: 12.2 ratio — ABNORMAL HIGH (ref 0.0–5.0)
Cholesterol, Total: 243 mg/dL — ABNORMAL HIGH (ref 100–199)
HDL: 20 mg/dL — ABNORMAL LOW (ref >39)
Triglycerides: 1512 mg/dL (ref 0–149)

## 2020-02-05 LAB — COMPREHENSIVE METABOLIC PANEL
ALT: 33 IU/L (ref 0–44)
AST: 25 IU/L (ref 0–40)
Albumin/Globulin Ratio: 1.3 (ref 1.2–2.2)
Albumin: 4.1 g/dL (ref 4.0–5.0)
Alkaline Phosphatase: 101 IU/L (ref 39–117)
BUN/Creatinine Ratio: 18 (ref 9–20)
BUN: 17 mg/dL (ref 6–24)
Bilirubin Total: 0.3 mg/dL (ref 0.0–1.2)
CO2: 19 mmol/L — ABNORMAL LOW (ref 20–29)
Calcium: 9.7 mg/dL (ref 8.7–10.2)
Chloride: 99 mmol/L (ref 96–106)
Creatinine, Ser: 0.97 mg/dL (ref 0.76–1.27)
GFR calc Af Amer: 108 mL/min/{1.73_m2} (ref >59)
GFR calc non Af Amer: 93 mL/min/{1.73_m2} (ref >59)
Globulin, Total: 3.1 g/dL (ref 1.5–4.5)
Glucose: 192 mg/dL — ABNORMAL HIGH (ref 65–99)
Potassium: 4.4 mmol/L (ref 3.5–5.2)
Sodium: 137 mmol/L (ref 134–144)
Total Protein: 7.2 g/dL (ref 6.0–8.5)

## 2020-02-05 LAB — HEMOGLOBIN A1C
Est. average glucose Bld gHb Est-mCnc: 166 mg/dL
Hgb A1c MFr Bld: 7.4 % — ABNORMAL HIGH (ref 4.8–5.6)

## 2020-02-05 MED ORDER — ATORVASTATIN CALCIUM 40 MG PO TABS: 40.0000 mg | ORAL_TABLET | Freq: Every day | ORAL | 2 refills | Status: DC

## 2020-02-05 MED ORDER — METFORMIN HCL 500 MG PO TABS: 500.0000 mg | ORAL_TABLET | Freq: Every day | ORAL | 2 refills | Status: DC

## 2020-02-05 NOTE — Telephone Encounter (Signed)
Prescriptions sent into pharmacy.

## 2020-02-05 NOTE — Telephone Encounter (Signed)
-----   Message from Birdie Sons, MD sent at 02/05/2020  8:22 AM EDT ----- cholesterol is very high at 243 and triglycerides are extremely high at 1,512.  His average sugar is 166 which meets definition of diabetes.  He needs to start metformin 500mg  one tablet daily, #30, rf x 2 He also needs to start back on cholesterol medication. If he tolerated atorvastatin then can put back on 40mg  every evening, #30, rf x 2. If he had any trouble with that then we could change to a different medication.

## 2020-02-05 NOTE — Telephone Encounter (Signed)
Tried calling patient. Left message to call back. OK for PEC Triage to advise patient then route message back to the office. Patient is scheduled to come in for a follow up appointment on Friday 02/07/2020, so we can review labs with him at that time if he does not return phone call by then.

## 2020-02-05 NOTE — Telephone Encounter (Signed)
Patient called back and was read lab note by Dr Caryn Section. Please place order for metformin and atorvastatin per lab note 02/05/20. Patient requesting they be sent to [facility]. Patient verbalized understanding of all information.

## 2020-02-06 NOTE — Progress Notes (Signed)
Established patient visit   Patient: Elijah Mora   DOB: Feb 25, 1971   49 y.o. Male  MRN: [id number] Visit Date: 02/07/2020  Today's healthcare provider: Lelon Huh, MD   Chief Complaint  Patient presents with  . Hyperlipidemia  . Diabetes   Mertie Moores as a scribe for Lelon Huh, MD.,have documented all relevant documentation on the behalf of Lelon Huh, MD,as directed by  Lelon Huh, MD while in the presence of Lelon Huh, MD.  Subjective    HPI Lipid/Cholesterol, Follow-up  Last lipid panel Other pertinent labs  Lab Results  Component Value Date   CHOL 243 (H) 02/04/2020   HDL 20 (L) 02/04/2020   LDLCALC Comment (A) 02/04/2020   TRIG 1,512 (HH) 02/04/2020   CHOLHDL 12.2 (H) 02/04/2020   Lab Results  Component Value Date   ALT 33 02/04/2020   AST 25 02/04/2020   PLT 265 05/03/2017   TSH 2.660 09/16/2019     He was last seen for this 5 months ago.  Management since that visit includes advised to start Atorvastatin 40 mg after labs were rechecked last week.   He reports good compliance with treatment. He is not having side effects.   Symptoms: No chest pain No chest pressure/discomfort  No dyspnea No lower extremity edema  No numbness or tingling of extremity No orthopnea  No palpitations No paroxysmal nocturnal dyspnea  No speech difficulty No syncope   Current diet: to much meat, not enough fiber, and no soda Current exercise: walking  The 10-year ASCVD risk score Mikey Bussing DC Jr., et al., 2013) is: 21.4%  ---------------------------------------------------------------------------------------------------  Diabetes Mellitus Type II, Follow-up  Lab Results  Component Value Date   HGBA1C 7.4 (H) 02/04/2020   Wt Readings from Last 3 Encounters:  02/07/20 268 lb (121.6 kg)  01/31/20 270 lb (122.5 kg)  01/15/20 267 lb 12.8 oz (121.5 kg)   Management since then includes advised to start Metformin 500 mg after a1c was  checked last week.  He reports good compliance with treatment. He is not having side effects.  Symptoms: No fatigue No foot ulcerations  No appetite changes No nausea  No paresthesia of the feet  No polydipsia  No polyuria No visual disturbances   No vomiting     Home blood sugar records: fasting range: not being checked at home  Episodes of hypoglycemia? No    Current insulin regiment: None Most Recent Eye Exam: N/A Current exercise: walking Current diet habits: to much meat, not enough fiber, and no soda   ---------------------------------------------------------------------------------------------------\      Medications: Outpatient Medications Prior to Visit  Medication Sig  . aspirin 81 MG tablet Take 81 mg by mouth daily.  Marland Kitchen atorvastatin (LIPITOR) 40 MG tablet Take 1 tablet (40 mg total) by mouth daily.  . cetirizine (ZYRTEC) 10 MG tablet Take 10 mg by mouth daily.  . dicloxacillin (DYNAPEN) 500 MG capsule Take 1 capsule (500 mg total) by mouth 4 (four) times daily for 10 days.  Marland Kitchen escitalopram (LEXAPRO) 10 MG tablet TAKE 1 TABLET BY MOUTH EVERY DAY  . fluticasone (FLONASE) 50 MCG/ACT nasal spray Place 1 spray into both nostrils daily.  Marland Kitchen glucosamine-chondroitin 500-400 MG tablet Take 1 tablet by mouth 2 (two) times daily.  Marland Kitchen ibuprofen (ADVIL,MOTRIN) 800 MG tablet Take 1 tablet (800 mg total) by mouth every 8 (eight) hours as needed.  . metFORMIN (GLUCOPHAGE) 500 MG tablet Take 1 tablet (500 mg total) by mouth daily.  Marland Kitchen  triamcinolone ointment (KENALOG) 0.5 % Apply 1 application topically 2 (two) times daily.   No facility-administered medications prior to visit.    Review of Systems  Constitutional: Negative.   Respiratory: Negative.   Cardiovascular: Negative.   Musculoskeletal: Negative.       Objective    BP (!) 148/96 (BP Location: Right Arm, Patient Position: Sitting, Cuff Size: Large)   Pulse 75   Temp (!) 97.3 F (36.3 C) (Temporal)   Wt 268 lb  (121.6 kg)   BMI 38.45 kg/m    Physical Exam   General: Appearance:    Obese male in no acute distress  Eyes:    PERRL, conjunctiva/corneas clear, EOM's intact       Lungs:     Clear to auscultation bilaterally, respirations unlabored  Heart:    Normal heart rate. Normal rhythm. No murmurs, rubs, or gallops.   MS:   All extremities are intact.   Neurologic:   Awake, alert, oriented x 3. No apparent focal neurological           defect.       No results found for any visits on 02/07/20.  Assessment & Plan     1. Type 2 diabetes mellitus without complication, without long-term current use of insulin (HCC) Recent diagnosis. Just started metformin. Counseled on diet and exercise and given written order for nutritional consult which is available to him through [facility]  2. Hypertriglyceridemia Recently started back on atorvastatin. Will check lipids at follow up in 3 months.   Other orders - glucose blood (ONETOUCH ULTRA) test strip; Use to check sugar daily for type 2 diabetes E11.9  Dispense: 100 each; Refill: 4 - Blood Glucose Monitoring Suppl (ONE TOUCH ULTRA 2) w/Device KIT; Use to check sugar daily for type 2 diabetes E11.9  Dispense: 1 kit; Refill: 0 - Lancets (ONETOUCH ULTRASOFT) lancets; Use to check blood sugar daily for type 2 diabetes E11.9  Dispense: 100 each; Refill: 4   Return in about 3 months (around 05/09/2020).      The entirety of the information documented in the History of Present Illness, Review of Systems and Physical Exam were personally obtained by me. Portions of this information were initially documented by the CMA and reviewed by me for thoroughness and accuracy.      Lelon Huh, MD  [facility] [phone] (phone) [phone] (fax)  [place]

## 2020-02-07 ENCOUNTER — Other Ambulatory Visit: Payer: Self-pay

## 2020-02-07 ENCOUNTER — Ambulatory Visit: Payer: BC Managed Care – PPO | Admitting: Family Medicine

## 2020-02-07 ENCOUNTER — Encounter: Payer: Self-pay | Admitting: Family Medicine

## 2020-02-07 VITALS — BP 148/96 | HR 75 | Temp 97.3°F | Wt 268.0 lb

## 2020-02-07 DIAGNOSIS — E119 Type 2 diabetes mellitus without complications: Secondary | ICD-10-CM | POA: Diagnosis not present

## 2020-02-07 DIAGNOSIS — E781 Pure hyperglyceridemia: Secondary | ICD-10-CM | POA: Diagnosis not present

## 2020-02-07 MED ORDER — ONETOUCH ULTRA VI STRP: ORAL_STRIP | 4 refills | Status: DC

## 2020-02-07 MED ORDER — ONETOUCH ULTRA 2 W/DEVICE KIT: PACK | 0 refills | Status: AC

## 2020-02-07 MED ORDER — ONETOUCH ULTRASOFT LANCETS MISC: 4 refills | Status: AC

## 2020-02-07 NOTE — Progress Notes (Deleted)
Established patient visit   Patient: Elijah Mora   DOB: 1971/08/17   49 y.o. Male  MRN: [id number] Visit Date: 05/12/2020  Today's healthcare provider: Lelon Huh, MD   No chief complaint on file.  Subjective    HPI Diabetes Mellitus Type II, Follow-up  Lab Results  Component Value Date   HGBA1C 7.4 (H) 02/04/2020   Wt Readings from Last 3 Encounters:  02/07/20 268 lb (121.6 kg)  01/31/20 270 lb (122.5 kg)  01/15/20 267 lb 12.8 oz (121.5 kg)   Last seen for diabetes on 02/07/20.  Management since then includes continue metformin that he had recently started and ordering nutritional counseling. He reports {excellent/good/fair/poor:19665} compliance with treatment. He {is/is not:21021397} having side effects. {document side effects if present:1} Symptoms: {Yes/No:20286} fatigue {Yes/No:20286} foot ulcerations  {Yes/No:20286} appetite changes {Yes/No:20286} nausea  {Yes/No:20286} paresthesia of the feet  {Yes/No:20286} polydipsia  {Yes/No:20286} polyuria {Yes/No:20286} visual disturbances   {Yes/No:20286} vomiting     Home blood sugar records: {diabetes glucometry results:16657}  Episodes of hypoglycemia? {Yes/No:20286} {enter symptoms and frequency of symptoms if yes:1}   Current insulin regiment: {enter 'none' or type of insulin and number of units taken with each dose of each insulin formulation that the patient is taking:1} Most Recent Eye Exam: *** {Current exercise:16438:::1} {Current diet habits:16563:::1}  Pertinent Labs: Lab Results  Component Value Date   NA 137 02/04/2020   K 4.4 02/04/2020   CREATININE 0.97 02/04/2020   GFRNONAA 93 02/04/2020   GFRAA 108 02/04/2020   GLUCOSE 192 (H) 02/04/2020     ---------------------------------------------------------------------------------------------------  Lipid/Cholesterol, Follow-up  Last lipid panel Other pertinent labs  Lab Results  Component Value Date   CHOL 243 (H) 02/04/2020   HDL  20 (L) 02/04/2020   LDLCALC Comment (A) 02/04/2020   TRIG 1,512 (HH) 02/04/2020   CHOLHDL 12.2 (H) 02/04/2020   Lab Results  Component Value Date   ALT 33 02/04/2020   AST 25 02/04/2020   PLT 265 05/03/2017   TSH 2.660 09/16/2019     He was last seen for this on 02/07/20. Management since that visit includes continue atorvastatin that he has recently restarted and ordering nutritional counseling.  He reports {excellent/good/fair/poor:19665} compliance with treatment. He {is/is not:9024} having side effects. {document side effects if present:1}  Symptoms: {Yes/No:20286} chest pain {Yes/No:20286} chest pressure/discomfort  {Yes/No:20286} dyspnea {Yes/No:20286} lower extremity edema  {Yes/No:20286} numbness or tingling of extremity {Yes/No:20286} orthopnea  {Yes/No:20286} palpitations {Yes/No:20286} paroxysmal nocturnal dyspnea  {Yes/No:20286} speech difficulty {Yes/No:20286} syncope   Current diet: {diet habits:16563} Current exercise: {exercise types:16438}  The 10-year ASCVD risk score Mikey Bussing DC Jr., et al., 2013) is: 21.4%  ---------------------------------------------------------------------------------------------------    {Show patient history (optional):23778::" "}   Medications: Outpatient Medications Prior to Visit  Medication Sig  . aspirin 81 MG tablet Take 81 mg by mouth daily.  Marland Kitchen atorvastatin (LIPITOR) 40 MG tablet Take 1 tablet (40 mg total) by mouth daily.  . Blood Glucose Monitoring Suppl (ONE TOUCH ULTRA 2) w/Device KIT Use to check sugar daily for type 2 diabetes E11.9  . cetirizine (ZYRTEC) 10 MG tablet Take 10 mg by mouth daily.  . dicloxacillin (DYNAPEN) 500 MG capsule Take 1 capsule (500 mg total) by mouth 4 (four) times daily for 10 days.  Marland Kitchen escitalopram (LEXAPRO) 10 MG tablet TAKE 1 TABLET BY MOUTH EVERY DAY  . fluticasone (FLONASE) 50 MCG/ACT nasal spray Place 1 spray into both nostrils daily.  Marland Kitchen glucosamine-chondroitin 500-400 MG tablet Take 1  tablet  by mouth 2 (two) times daily.  Marland Kitchen glucose blood (ONETOUCH ULTRA) test strip Use to check sugar daily for type 2 diabetes E11.9  . ibuprofen (ADVIL,MOTRIN) 800 MG tablet Take 1 tablet (800 mg total) by mouth every 8 (eight) hours as needed.  . Lancets (ONETOUCH ULTRASOFT) lancets Use to check blood sugar daily for type 2 diabetes E11.9  . metFORMIN (GLUCOPHAGE) 500 MG tablet Take 1 tablet (500 mg total) by mouth daily.  Marland Kitchen triamcinolone ointment (KENALOG) 0.5 % Apply 1 application topically 2 (two) times daily.   No facility-administered medications prior to visit.    Review of Systems  {CBC  CMET  Lipids  Results Review (optional):23779::" "}  Objective    There were no vitals taken for this visit. {Show previous vital signs (optional):23777::" "}  Physical Exam  ***  No results found for any visits on 05/12/20.  Assessment & Plan     ***  No follow-ups on file.      {provider attestation***:1}   Lelon Huh, MD  [facility] [phone] (phone) [phone] (fax)  [place]

## 2020-02-07 NOTE — Patient Instructions (Addendum)
.   Please review the attached list of medications and notify my office if there are any errors.    Check your blood sugar 2-3 times a week. The goal if for your fasting sugar to stay below 120 and post prandial sugar below 180.   Let me know if you start seeing fasting sugars above 200

## 2020-03-24 ENCOUNTER — Ambulatory Visit: Payer: Self-pay

## 2020-04-03 ENCOUNTER — Other Ambulatory Visit: Payer: Self-pay | Admitting: Family Medicine

## 2020-04-03 DIAGNOSIS — F419 Anxiety disorder, unspecified: Secondary | ICD-10-CM

## 2020-04-14 ENCOUNTER — Other Ambulatory Visit: Payer: Self-pay

## 2020-04-14 ENCOUNTER — Ambulatory Visit: Payer: BC Managed Care – PPO

## 2020-04-14 NOTE — Progress Notes (Signed)
Nutrition Education: 04/14/20  CC: New onset DM Type 2.  Seeking diabetes education.  HXKurk Mora is 49 year of age.  Has strong family history of diabetes.  MD notes it is not if but when he would develop glucose issues. Ht: 6'-6'3. WT: 286, currently lost last few weeks to 258 lbs.  HgA1C: 7.4%. Glucose 192 mg/dl. T Chol=243, TG= 1512. HDL< 20.  Unable to measure LDL, VLDL. GFR=108.  Activity: Has gotten back into the gym.  Resistance training 2 X/week and walking 4 X/week.  Feeling less tired and has some more energy.  Diet Recall: 8:00 Coffee with 2 tab half and half. Low carb protein bar. Net carbs = 3. 1:00 Tuna salad sandwich with limited mayo on 2 slices whole wheat bread. Water to drink.   Notes he drinks 1/2 to 1 gallon of water per day since he has a h/x of calcium kidney stones. 5:00 pm Will have lean meat such as chicken, fish or pork loin.  Avoiding red meats.  Will have green beans with this and a mall potato baked or roasted.  Drinks water. Notes that he has started to use and occasional low carb beer.    He has not yet been able to get the meter preferred by his insurance company.  He is to check his blood sugar 2-3 times per week.  He currently is on Metformin once daily and has had some gas but has had no diarrhea.  Takes with meals. He verbalizes an appreciation of data and wants to start checking blood glucose to see the effects of diet and execrise.  Goal: To eat properly and lose weight. Get blood glucose under control. Not starve to death in the process.  Handouts:  Pre-Diabetes information sheet with exercise, weight loss, dietary recommendations. Food and Food Group Serving [employer]. Food Label My Plate Handout.  F/UP: Follow-up with me at the [facility] as needed.  Would like to know how he is doing with his next A1C results and weight loss goal.   Jesus Genera, RN, RD, LDN

## 2020-04-24 DIAGNOSIS — Z48812 Encounter for surgical aftercare following surgery on the circulatory system: Secondary | ICD-10-CM | POA: Diagnosis not present

## 2020-04-24 DIAGNOSIS — I776 Arteritis, unspecified: Secondary | ICD-10-CM | POA: Diagnosis not present

## 2020-04-30 ENCOUNTER — Other Ambulatory Visit: Payer: Self-pay | Admitting: Family Medicine

## 2020-04-30 DIAGNOSIS — E781 Pure hyperglyceridemia: Secondary | ICD-10-CM

## 2020-04-30 DIAGNOSIS — E119 Type 2 diabetes mellitus without complications: Secondary | ICD-10-CM

## 2020-05-12 ENCOUNTER — Ambulatory Visit: Payer: BC Managed Care – PPO | Admitting: Family Medicine

## 2020-05-29 ENCOUNTER — Encounter: Payer: Self-pay | Admitting: Family Medicine

## 2020-05-29 ENCOUNTER — Other Ambulatory Visit: Payer: Self-pay

## 2020-05-29 ENCOUNTER — Ambulatory Visit: Payer: BC Managed Care – PPO | Admitting: Family Medicine

## 2020-05-29 VITALS — BP 144/82 | HR 85 | Temp 99.5°F | Wt 262.0 lb

## 2020-05-29 DIAGNOSIS — E119 Type 2 diabetes mellitus without complications: Secondary | ICD-10-CM | POA: Diagnosis not present

## 2020-05-29 DIAGNOSIS — E781 Pure hyperglyceridemia: Secondary | ICD-10-CM | POA: Diagnosis not present

## 2020-05-29 DIAGNOSIS — R030 Elevated blood-pressure reading, without diagnosis of hypertension: Secondary | ICD-10-CM

## 2020-05-29 DIAGNOSIS — I10 Essential (primary) hypertension: Secondary | ICD-10-CM | POA: Insufficient documentation

## 2020-05-29 LAB — POCT GLYCOSYLATED HEMOGLOBIN (HGB A1C): Hemoglobin A1C: 6.1 % — AB (ref 4.0–5.6)

## 2020-05-29 NOTE — Progress Notes (Signed)
Established patient visit   Patient: Elijah Mora   DOB: 02-01-1971   49 y.o. Male  MRN: [id number] Visit Date: 05/29/2020  Today's healthcare provider: Lelon Huh, MD   Chief Complaint  Patient presents with  . Diabetes  . Hyperlipidemia  . Hypertension   Subjective    HPI   Diabetes Mellitus Type II, follow-up  Lab Results  Component Value Date   HGBA1C 7.4 (H) 02/04/2020   Last seen for diabetes 4 months ago.  Management since then includes referral to diabetic education. He reports excellent compliance with treatment. He has been working with personal training and made significant improvements with his diet and is feeling well.  He is not having side effects.   Home blood sugar records: not being checked  Episodes of hypoglycemia? No    Current insulin regiment: None  Most Recent Eye Exam: Pt is due for an appointment  --------------------------------------------------------------------------------------------------- Hypertension, follow-up  BP Readings from Last 3 Encounters:  05/29/20 (!) 144/82  02/07/20 (!) 148/96  01/31/20 (!) 158/92   Wt Readings from Last 3 Encounters:  05/29/20 262 lb (118.8 kg)  02/07/20 268 lb (121.6 kg)  01/31/20 270 lb (122.5 kg)     He was last seen for hypertension 4 months ago.  Management since that visit includes no changes. He reports excellent compliance with treatment. He is not having side effects.  He is exercising. He is adherent to low salt diet.   Outside blood pressures are normal at home.  He does not smoke.  Use of agents associated with hypertension: none.   --------------------------------------------------------------------------------------------------- Lipid/Cholesterol, follow-up  Last Lipid Panel: Lab Results  Component Value Date   CHOL 243 (H) 02/04/2020   LDLCALC Comment (A) 02/04/2020   HDL 20 (L) 02/04/2020   TRIG 1,512 ([place]) 02/04/2020    He was last seen for this 4  months ago.  Management since that visit includes starting back on atorvastatin. Marland Kitchen  He reports excellent compliance with treatment. He is not having side effects.   Symptoms: No appetite changes No foot ulcerations  No chest pain No chest pressure/discomfort  No dyspnea No orthopnea  No fatigue No lower extremity edema  No palpitations No paroxysmal nocturnal dyspnea  No nausea No numbness or tingling of extremity  No polydipsia No polyuria  No speech difficulty No syncope   He is following a Diabetic diet. Current exercise: cardiovascular workout on exercise equipment  Last metabolic panel Lab Results  Component Value Date   GLUCOSE 192 (H) 02/04/2020   NA 137 02/04/2020   K 4.4 02/04/2020   BUN 17 02/04/2020   CREATININE 0.97 02/04/2020   GFRNONAA 93 02/04/2020   GFRAA 108 02/04/2020   CALCIUM 9.7 02/04/2020   AST 25 02/04/2020   ALT 33 02/04/2020   The 10-year ASCVD risk score Mikey Bussing DC Jr., et al., 2013) is: 20.5%  ---------------------------------------------------------------------------------------------------      Medications: Outpatient Medications Prior to Visit  Medication Sig  . aspirin 81 MG tablet Take 81 mg by mouth daily.  Marland Kitchen atorvastatin (LIPITOR) 40 MG tablet TAKE 1 TABLET BY MOUTH EVERY DAY  . Blood Glucose Monitoring Suppl (ONE TOUCH ULTRA 2) w/Device KIT Use to check sugar daily for type 2 diabetes E11.9  . cetirizine (ZYRTEC) 10 MG tablet Take 10 mg by mouth daily.  Marland Kitchen escitalopram (LEXAPRO) 10 MG tablet TAKE 1 TABLET BY MOUTH EVERY DAY  . fluticasone (FLONASE) 50 MCG/ACT nasal spray Place 1 spray into both  nostrils daily.  Marland Kitchen glucose blood (ONETOUCH ULTRA) test strip Use to check sugar daily for type 2 diabetes E11.9  . ibuprofen (ADVIL,MOTRIN) 800 MG tablet Take 1 tablet (800 mg total) by mouth every 8 (eight) hours as needed.  . Lancets (ONETOUCH ULTRASOFT) lancets Use to check blood sugar daily for type 2 diabetes E11.9  . metFORMIN  (GLUCOPHAGE) 500 MG tablet TAKE 1 TABLET BY MOUTH EVERY DAY  . triamcinolone ointment (KENALOG) 0.5 % Apply 1 application topically 2 (two) times daily.  Marland Kitchen glucosamine-chondroitin 500-400 MG tablet Take 1 tablet by mouth 2 (two) times daily. (Patient not taking: Reported on 05/29/2020)   No facility-administered medications prior to visit.    Review of Systems  Constitutional: Negative.   Respiratory: Negative.   Cardiovascular: Negative.   Gastrointestinal: Negative.   Musculoskeletal: Negative.   Neurological: Negative for dizziness, light-headedness and headaches.      Objective    BP (!) 144/82 (BP Location: Left Arm, Patient Position: Sitting, Cuff Size: Large)   Pulse 85   Temp 99.5 F (37.5 C) (Oral)   Wt 262 lb (118.8 kg)   SpO2 97%   BMI 37.59 kg/m    Physical Exam  General appearance: Obese male, cooperative and in no acute distress Head: Normocephalic, without obvious abnormality, atraumatic Respiratory: Respirations even and unlabored, normal respiratory rate Extremities: All extremities are intact.  Skin: Skin color, texture, turgor normal. No rashes seen  Psych: Appropriate mood and affect. Neurologic: Mental status: Alert, oriented to person, place, and time, thought content appropriate.   Results for orders placed or performed in visit on 05/29/20  POCT glycosylated hemoglobin (Hb A1C)  Result Value Ref Range   Hemoglobin A1C 6.1 (A) 4.0 - 5.6 %    Assessment & Plan     1. Type 2 diabetes mellitus without complication, without long-term current use of insulin ([place]) Much better with lifestyle changes. Will schedule follow up 3-6 months after reviewing labs.   2. Hypertriglyceridemia He is tolerating atorvastatin well with no adverse effects.   - Comprehensive metabolic panel - Lipid panel  3. Elevated blood pressure reading Improving now that he is exercising and losing weight. Continue to monitor.   4. Morbid obesity ([place]) Doing very well with  dietary improvements and exercise.   He anticipates getting flu shot at work.     No follow-ups on file.      The entirety of the information documented in the History of Present Illness, Review of Systems and Physical Exam were personally obtained by me. Portions of this information were initially documented by the CMA and reviewed by me for thoroughness and accuracy.      Lelon Huh, MD  [facility] [phone] (phone) [phone] (fax)  [place]

## 2020-05-30 LAB — COMPREHENSIVE METABOLIC PANEL
ALT: 37 IU/L (ref 0–44)
AST: 21 IU/L (ref 0–40)
Albumin/Globulin Ratio: 2.2 (ref 1.2–2.2)
Albumin: 5 g/dL (ref 4.0–5.0)
Alkaline Phosphatase: 73 IU/L (ref 48–121)
BUN/Creatinine Ratio: 18 (ref 9–20)
BUN: 17 mg/dL (ref 6–24)
Bilirubin Total: 0.7 mg/dL (ref 0.0–1.2)
CO2: 25 mmol/L (ref 20–29)
Calcium: 10.3 mg/dL — ABNORMAL HIGH (ref 8.7–10.2)
Chloride: 101 mmol/L (ref 96–106)
Creatinine, Ser: 0.94 mg/dL (ref 0.76–1.27)
GFR calc Af Amer: 112 mL/min/{1.73_m2} (ref >59)
GFR calc non Af Amer: 97 mL/min/{1.73_m2} (ref >59)
Globulin, Total: 2.3 g/dL (ref 1.5–4.5)
Glucose: 75 mg/dL (ref 65–99)
Potassium: 4.3 mmol/L (ref 3.5–5.2)
Sodium: 139 mmol/L (ref 134–144)
Total Protein: 7.3 g/dL (ref 6.0–8.5)

## 2020-05-30 LAB — LIPID PANEL
Chol/HDL Ratio: 4.1 ratio (ref 0.0–5.0)
Cholesterol, Total: 112 mg/dL (ref 100–199)
HDL: 27 mg/dL — ABNORMAL LOW (ref >39)
LDL Chol Calc (NIH): 60 mg/dL (ref 0–99)
Triglycerides: 144 mg/dL (ref 0–149)
VLDL Cholesterol Cal: 25 mg/dL (ref 5–40)

## 2020-07-03 ENCOUNTER — Ambulatory Visit: Payer: BC Managed Care – PPO | Admitting: Physician Assistant

## 2020-07-03 ENCOUNTER — Encounter: Payer: Self-pay | Admitting: Physician Assistant

## 2020-07-03 ENCOUNTER — Other Ambulatory Visit: Payer: Self-pay

## 2020-07-03 ENCOUNTER — Other Ambulatory Visit: Payer: Self-pay | Admitting: Family Medicine

## 2020-07-03 VITALS — BP 161/88 | HR 86 | Temp 98.3°F | Resp 16 | Ht 70.0 in | Wt 263.0 lb

## 2020-07-03 DIAGNOSIS — F419 Anxiety disorder, unspecified: Secondary | ICD-10-CM

## 2020-07-03 DIAGNOSIS — L03115 Cellulitis of right lower limb: Secondary | ICD-10-CM | POA: Diagnosis not present

## 2020-07-03 MED ORDER — DICLOXACILLIN SODIUM 500 MG PO CAPS: 500.0000 mg | ORAL_CAPSULE | Freq: Four times a day (QID) | ORAL | 0 refills | Status: DC

## 2020-07-03 NOTE — Progress Notes (Signed)
Established patient visit   Patient: Elijah Mora   DOB: 1971/02/15   49 y.o. Male  MRN: [id number] Visit Date: 07/03/2020  Today's healthcare provider: Trinna Post, PA-C   Chief Complaint  Patient presents with  . vascular issues   Subjective    HPI  Patient with history of DM, venous dysfunction and cellulitis presents today with painful redness on his right leg. He reports that he has had this before. He reports this has been going on for several days with erythema, heat, and weeping of the area. Patient reports that his symptoms are similar to what he had during his 12/2019 visit here in the office. He denies fevers, chills, nausea, vomiting.       Medications: Outpatient Medications Prior to Visit  Medication Sig  . aspirin 81 MG tablet Take 81 mg by mouth daily.  Marland Kitchen atorvastatin (LIPITOR) 40 MG tablet TAKE 1 TABLET BY MOUTH EVERY DAY  . Blood Glucose Monitoring Suppl (ONE TOUCH ULTRA 2) w/Device KIT Use to check sugar daily for type 2 diabetes E11.9  . cetirizine (ZYRTEC) 10 MG tablet Take 10 mg by mouth daily.  Marland Kitchen escitalopram (LEXAPRO) 10 MG tablet TAKE 1 TABLET BY MOUTH EVERY DAY  . fluticasone (FLONASE) 50 MCG/ACT nasal spray Place 1 spray into both nostrils daily.  Marland Kitchen glucose blood (ONETOUCH ULTRA) test strip Use to check sugar daily for type 2 diabetes E11.9  . ibuprofen (ADVIL,MOTRIN) 800 MG tablet Take 1 tablet (800 mg total) by mouth every 8 (eight) hours as needed.  . Lancets (ONETOUCH ULTRASOFT) lancets Use to check blood sugar daily for type 2 diabetes E11.9  . metFORMIN (GLUCOPHAGE) 500 MG tablet TAKE 1 TABLET BY MOUTH EVERY DAY  . triamcinolone ointment (KENALOG) 0.5 % Apply 1 application topically 2 (two) times daily.  Marland Kitchen glucosamine-chondroitin 500-400 MG tablet Take 1 tablet by mouth 2 (two) times daily. (Patient not taking: Reported on 05/29/2020)   No facility-administered medications prior to visit.    Review of Systems  Constitutional:  Negative.   Cardiovascular: Positive for leg swelling.  Neurological: Negative for weakness and numbness.      Objective    BP (!) 161/88   Pulse 86   Temp 98.3 F (36.8 C)   Resp 16   Ht _0  (1.778 m)   Wt 263 lb (119.3 kg)   BMI 37.74 kg/m    Physical Exam Constitutional:      Appearance: Normal appearance.  Cardiovascular:     Rate and Rhythm: Normal rate and regular rhythm.  Pulmonary:     Effort: Pulmonary effort is normal. No respiratory distress.  Skin:    General: Skin is warm.     Findings: Erythema and rash present.  Neurological:     Mental Status: He is alert and oriented to person, place, and time. Mental status is at baseline.  Psychiatric:        Mood and Affect: Mood normal.        Behavior: Behavior normal.      Media Information   Document Information  Photos  Right leg  07/03/2020 15:26  Attached To:  Office Visit on 07/03/20 with Trinna Post, PA-C  Source Information  Paulene Floor  [place]  Right leg   07/03/2020 15:26  Attached To:  Office Visit on 07/03/20 with Trinna Post, PA-C  Source Information  Carles Collet  M, PA-C  [facility]     No results found for any visits on 07/03/20.  Assessment & Plan    1. Cellulitis of right lower extremity  Follow up 7-10 days for wound recheck.   - dicloxacillin (DYNAPEN) 500 MG capsule; Take 1 capsule (500 mg total) by mouth 4 (four) times daily for 10 days.  Dispense: 40 capsule; Refill: 0     I, Trinna Post, PA-C, have reviewed all documentation for this visit. The documentation on 07/09/20 for the exam, diagnosis, procedures, and orders are all accurate and complete.  The entirety of the information documented in the History of Present Illness, Review of Systems and Physical Exam were personally obtained by me. Portions of this information were initially documented by  Wilburt Finlay, CMA and reviewed by me for thoroughness and accuracy.     Paulene Floor  [facility] [phone] (phone) [phone] (fax)  [place]

## 2020-07-16 ENCOUNTER — Encounter: Payer: Self-pay | Admitting: Physician Assistant

## 2020-07-16 ENCOUNTER — Other Ambulatory Visit: Payer: Self-pay

## 2020-07-16 ENCOUNTER — Ambulatory Visit: Payer: BC Managed Care – PPO | Admitting: Physician Assistant

## 2020-07-16 VITALS — BP 148/81 | HR 88 | Temp 98.8°F | Resp 16 | Ht 70.0 in | Wt 264.0 lb

## 2020-07-16 DIAGNOSIS — H6121 Impacted cerumen, right ear: Secondary | ICD-10-CM

## 2020-07-16 DIAGNOSIS — H6091 Unspecified otitis externa, right ear: Secondary | ICD-10-CM | POA: Diagnosis not present

## 2020-07-16 DIAGNOSIS — L03115 Cellulitis of right lower limb: Secondary | ICD-10-CM

## 2020-07-16 MED ORDER — OFLOXACIN 0.3 % OT SOLN: 10.0000 [drp] | Freq: Every day | OTIC | 0 refills | Status: DC

## 2020-07-16 NOTE — Patient Instructions (Signed)

## 2020-07-16 NOTE — Progress Notes (Signed)
Established patient visit   Patient: Elijah Mora   DOB: [birth year]   49 y.o. Male  MRN: [id number] Visit Date: 07/16/2020  Today's healthcare provider: Trinna Post, PA-C   Chief Complaint  Patient presents with  . Cellulitis   Subjective    HPI  Patient presents today to follow up on cellulitis on his right lower leg. He reports that he tolerated the treatment well. His condition has improved. He did have some mild itching on dicloxacillin. He did not have shortness of breath or throat swelling.   He is also reporting right ear pain. He usually has cerumen impaction but now has right ear pain. No drainage, no fever.       Medications: Outpatient Medications Prior to Visit  Medication Sig  . aspirin 81 MG tablet Take 81 mg by mouth daily.  Marland Kitchen atorvastatin (LIPITOR) 40 MG tablet TAKE 1 TABLET BY MOUTH EVERY DAY  . Blood Glucose Monitoring Suppl (ONE TOUCH ULTRA 2) w/Device KIT Use to check sugar daily for type 2 diabetes E11.9  . cetirizine (ZYRTEC) 10 MG tablet Take 10 mg by mouth daily.  Marland Kitchen escitalopram (LEXAPRO) 10 MG tablet TAKE 1 TABLET BY MOUTH EVERY DAY  . fluticasone (FLONASE) 50 MCG/ACT nasal spray Place 1 spray into both nostrils daily.  Marland Kitchen glucose blood (ONETOUCH ULTRA) test strip Use to check sugar daily for type 2 diabetes E11.9  . ibuprofen (ADVIL,MOTRIN) 800 MG tablet Take 1 tablet (800 mg total) by mouth every 8 (eight) hours as needed.  . Lancets (ONETOUCH ULTRASOFT) lancets Use to check blood sugar daily for type 2 diabetes E11.9  . metFORMIN (GLUCOPHAGE) 500 MG tablet TAKE 1 TABLET BY MOUTH EVERY DAY  . triamcinolone ointment (KENALOG) 0.5 % Apply 1 application topically 2 (two) times daily.  Marland Kitchen glucosamine-chondroitin 500-400 MG tablet Take 1 tablet by mouth 2 (two) times daily. (Patient not taking: Reported on 05/29/2020)   No facility-administered medications prior to visit.    Review of Systems    Objective    BP (!) 148/81   Pulse 88    Temp 98.8 F (37.1 C)   Resp 16   Ht 5' 10"  (1.778 m)   Wt 264 lb (119.7 kg)   BMI 37.88 kg/m    Physical Exam Constitutional:      Appearance: Normal appearance.  HENT:     Right Ear: Tympanic membrane normal. Swelling and tenderness present.     Left Ear: Hearing normal.  Skin:    General: Skin is warm and dry.     Findings: No rash.  Neurological:     Mental Status: He is alert and oriented to person, place, and time. Mental status is at baseline.  Psychiatric:        Mood and Affect: Mood normal.        Behavior: Behavior normal.      Media Information   Document Information  Photos  Right leg 1   07/16/2020 08:45  Attached To:  Office Visit on 07/16/20 with Trinna Post, PA-C  Source Information  Paulene Floor  [place]  Right leg 2  07/16/2020 08:45  Attached To:  Office Visit on 07/16/20 with Trinna Post, PA-C  Source Information  Carles Collet M, PA-C  [facility]     No results found for any visits on 07/16/20.  Assessment & Plan  1. Cellulitis of right lower extremity   Improved.  2. Impacted cerumen of right ear   3. Otitis externa of right ear, unspecified chronicity, unspecified type  - ofloxacin (FLOXIN OTIC) 0.3 % OTIC solution; Place 10 drops into the right ear daily.  Dispense: 5 mL; Refill: 0    No follow-ups on file.      ITrinna Post, PA-C, have reviewed all documentation for this visit. The documentation on 07/16/20 for the exam, diagnosis, procedures, and orders are all accurate and complete.  The entirety of the information documented in the History of Present Illness, Review of Systems and Physical Exam were personally obtained by me. Portions of this information were initially documented by Wilburt Finlay, CMA and reviewed by me for thoroughness and accuracy.     Paulene Floor  [facility] [phone] (phone) [phone] (fax)  [place]

## 2020-07-21 ENCOUNTER — Telehealth: Payer: Self-pay

## 2020-07-21 DIAGNOSIS — H6091 Unspecified otitis externa, right ear: Secondary | ICD-10-CM

## 2020-07-21 MED ORDER — OFLOXACIN 0.3 % OT SOLN: 10.0000 [drp] | Freq: Every day | OTIC | 0 refills | Status: DC

## 2020-07-21 NOTE — Telephone Encounter (Signed)
Copied from [place] [id number]. Topic: General - Other >> Jul 21, 2020  9:45 AM Yvette Rack wrote: Reason for CRM: Pt stated he came in for an appt last week and now he is having the same exact issue with his left ear. Pt requests that a Rx for the ear drops be sent to [id number] - , [place]

## 2020-07-21 NOTE — Addendum Note (Signed)
Addended by: Trinna Post on: 07/21/2020 12:06 PM   Modules accepted: Orders

## 2020-07-21 NOTE — Telephone Encounter (Signed)
Please review. Patient last seen by Adriana on 07/16/2020 for this problem.

## 2020-07-31 ENCOUNTER — Other Ambulatory Visit: Payer: Self-pay | Admitting: Family Medicine

## 2020-07-31 DIAGNOSIS — E119 Type 2 diabetes mellitus without complications: Secondary | ICD-10-CM

## 2020-08-03 ENCOUNTER — Other Ambulatory Visit: Payer: Self-pay | Admitting: Family Medicine

## 2020-08-03 DIAGNOSIS — E781 Pure hyperglyceridemia: Secondary | ICD-10-CM

## 2020-08-11 DIAGNOSIS — H6982 Other specified disorders of Eustachian tube, left ear: Secondary | ICD-10-CM | POA: Diagnosis not present

## 2020-08-11 DIAGNOSIS — H903 Sensorineural hearing loss, bilateral: Secondary | ICD-10-CM | POA: Diagnosis not present

## 2020-08-11 DIAGNOSIS — H6021 Malignant otitis externa, right ear: Secondary | ICD-10-CM | POA: Diagnosis not present

## 2020-08-11 DIAGNOSIS — G4733 Obstructive sleep apnea (adult) (pediatric): Secondary | ICD-10-CM | POA: Diagnosis not present

## 2020-08-11 DIAGNOSIS — H6011 Cellulitis of right external ear: Secondary | ICD-10-CM | POA: Diagnosis not present

## 2020-09-28 ENCOUNTER — Other Ambulatory Visit: Payer: Self-pay | Admitting: Family Medicine

## 2020-09-28 DIAGNOSIS — F419 Anxiety disorder, unspecified: Secondary | ICD-10-CM

## 2020-10-27 ENCOUNTER — Other Ambulatory Visit: Payer: Self-pay | Admitting: Family Medicine

## 2020-10-27 DIAGNOSIS — E119 Type 2 diabetes mellitus without complications: Secondary | ICD-10-CM

## 2020-11-04 ENCOUNTER — Other Ambulatory Visit: Payer: Self-pay | Admitting: Family Medicine

## 2020-11-04 DIAGNOSIS — E781 Pure hyperglyceridemia: Secondary | ICD-10-CM

## 2020-12-31 ENCOUNTER — Other Ambulatory Visit: Payer: Self-pay | Admitting: Family Medicine

## 2020-12-31 DIAGNOSIS — F419 Anxiety disorder, unspecified: Secondary | ICD-10-CM

## 2021-01-23 ENCOUNTER — Other Ambulatory Visit: Payer: Self-pay | Admitting: Family Medicine

## 2021-01-23 DIAGNOSIS — F419 Anxiety disorder, unspecified: Secondary | ICD-10-CM

## 2021-01-23 NOTE — Telephone Encounter (Signed)
Last OV that addresses issue: 09/16/19. Called pt and LM on VM to call office to make appointment. Call back number provided.

## 2021-01-27 ENCOUNTER — Other Ambulatory Visit: Payer: Self-pay | Admitting: Family Medicine

## 2021-01-27 DIAGNOSIS — E119 Type 2 diabetes mellitus without complications: Secondary | ICD-10-CM

## 2021-01-27 NOTE — Telephone Encounter (Signed)
Courtesy refill. Patient needs office visit. Requested Prescriptions  Pending Prescriptions Disp Refills  . metFORMIN (GLUCOPHAGE) 500 MG tablet [Pharmacy Med Name: METFORMIN HCL 500 MG TABLET] 30 tablet 0    Sig: TAKE 1 TABLET BY MOUTH EVERY DAY     Endocrinology:  Diabetes - Biguanides Failed - 01/27/2021  1:49 AM      Failed - HBA1C is between 0 and 7.9 and within 180 days    Hemoglobin A1C  Date Value Ref Range Status  05/29/2020 6.1 (A) 4.0 - 5.6 % Final    Comment:    Average 128   Hgb A1c MFr Bld  Date Value Ref Range Status  02/04/2020 7.4 (H) 4.8 - 5.6 % Final    Comment:             Prediabetes: 5.7 - 6.4          Diabetes: >6.4          Glycemic control for adults with diabetes: <7.0          Failed - Valid encounter within last 6 months    Recent Outpatient Visits          6 months ago Cellulitis of right lower extremity   [facility] [place], Adriana M, PA-C   6 months ago Cellulitis of right lower extremity   [place], Adriana M, PA-C   8 months ago Type 2 diabetes mellitus without complication, without long-term current use of insulin [facility])   [facility] Birdie Sons, MD   11 months ago Type 2 diabetes mellitus without complication, without long-term current use of insulin [facility])   [facility] Birdie Sons, MD   12 months ago Cellulitis of left lower extremity   [facility] Birdie Sons, MD             Passed - Cr in normal range and within 360 days    Creatinine  Date Value Ref Range Status  08/05/2013 1.11 0.60 - 1.30 mg/dL Final   Creatinine, Ser  Date Value Ref Range Status  05/29/2020 0.94 0.76 - 1.27 mg/dL Final         Passed - eGFR in normal range and within 360 days    EGFR (African American)  Date Value Ref Range Status  08/05/2013 >60  Final   GFR calc Af Amer  Date Value Ref Range Status  05/29/2020 112 >59 mL/min/1.73 Final    Comment:     **[facility] currently reports eGFR in compliance with the current**   recommendations of the [employer]. [facility] will   update reporting as new guidelines are published from the NKF-ASN   Task force.    EGFR (Non-African Amer.)  Date Value Ref Range Status  08/05/2013 >60  Final    Comment:    eGFR values <30m/min/1.73 m2 may be an indication of chronic kidney disease (CKD). Calculated eGFR is useful in patients with stable renal function. The eGFR calculation will not be reliable in acutely ill patients when serum creatinine is changing rapidly. It is not useful in  patients on dialysis. The eGFR calculation may not be applicable to patients at the low and high extremes of body sizes, pregnant women, and vegetarians.    GFR calc non Af Amer  Date Value Ref Range Status  05/29/2020 97 >59 mL/min/1.73 Final

## 2021-02-02 ENCOUNTER — Other Ambulatory Visit: Payer: Self-pay | Admitting: Family Medicine

## 2021-02-02 DIAGNOSIS — E781 Pure hyperglyceridemia: Secondary | ICD-10-CM

## 2021-02-02 NOTE — Telephone Encounter (Signed)
Requested medications are due for refill today yes  Requested medications are on the active medication list yes  Last refill 2/9  Last visit 05/2020  Future visit scheduled no  Notes to clinic Was given rx for 3 months in Feb, no upcoming visit, however, does meet protocol of visit and lab within 12 months.

## 2021-02-16 ENCOUNTER — Other Ambulatory Visit: Payer: Self-pay | Admitting: Family Medicine

## 2021-02-16 DIAGNOSIS — F419 Anxiety disorder, unspecified: Secondary | ICD-10-CM

## 2021-02-18 ENCOUNTER — Other Ambulatory Visit: Payer: Self-pay | Admitting: Family Medicine

## 2021-02-18 DIAGNOSIS — E119 Type 2 diabetes mellitus without complications: Secondary | ICD-10-CM

## 2021-02-18 NOTE — Telephone Encounter (Signed)
Pt. Has appointment.

## 2021-02-22 ENCOUNTER — Other Ambulatory Visit: Payer: Self-pay | Admitting: Family Medicine

## 2021-02-22 DIAGNOSIS — F419 Anxiety disorder, unspecified: Secondary | ICD-10-CM

## 2021-03-02 ENCOUNTER — Other Ambulatory Visit: Payer: Self-pay | Admitting: Family Medicine

## 2021-03-02 DIAGNOSIS — E781 Pure hyperglyceridemia: Secondary | ICD-10-CM

## 2021-03-08 ENCOUNTER — Encounter: Payer: Self-pay | Admitting: Family Medicine

## 2021-03-08 ENCOUNTER — Other Ambulatory Visit: Payer: Self-pay

## 2021-03-08 ENCOUNTER — Ambulatory Visit: Payer: BC Managed Care – PPO | Admitting: Family Medicine

## 2021-03-08 VITALS — BP 156/87 | Wt 271.0 lb

## 2021-03-08 DIAGNOSIS — E119 Type 2 diabetes mellitus without complications: Secondary | ICD-10-CM

## 2021-03-08 DIAGNOSIS — R809 Proteinuria, unspecified: Secondary | ICD-10-CM

## 2021-03-08 LAB — POCT UA - MICROALBUMIN: Microalbumin Ur, POC: 50 mg/L

## 2021-03-08 LAB — POCT GLYCOSYLATED HEMOGLOBIN (HGB A1C): Hemoglobin A1C: 7.1 % — AB (ref 4.0–5.6)

## 2021-03-08 MED ORDER — TRIAMCINOLONE ACETONIDE 0.5 % EX OINT: 1.0000 "application " | TOPICAL_OINTMENT | Freq: Two times a day (BID) | CUTANEOUS | 3 refills | Status: DC

## 2021-03-08 NOTE — Progress Notes (Signed)
Established patient visit   Patient: Elijah Mora   DOB: [birth year]   50 y.o. Male  MRN: [id number] Visit Date: 03/08/2021  Today's healthcare provider: Lelon Huh, MD   Chief Complaint  Patient presents with   Hyperlipidemia   Hypertension   Diabetes   Subjective    HPI  Diabetes Mellitus Type II, follow-up  Lab Results  Component Value Date   HGBA1C 6.1 (A) 05/29/2020   HGBA1C 7.4 (H) 02/04/2020   Last seen for diabetes 9 months ago.  Management since then includes continuing the same treatment. He reports excellent compliance with treatment. He is not having side effects.   Home blood sugar records:  not being checked.  Episodes of hypoglycemia? No    Current insulin regiment: none Most Recent Eye Exam: >1year  --------------------------------------------------------------------------------------------------- Hypertension, follow-up  BP Readings from Last 3 Encounters:  03/08/21 (!) 156/87  07/16/20 (!) 148/81  07/03/20 (!) 161/88   Wt Readings from Last 3 Encounters:  03/08/21 271 lb (122.9 kg)  07/16/20 264 lb (119.7 kg)  07/03/20 263 lb (119.3 kg)     He was last seen for hypertension 9 months ago.  BP at that visit was 144/82. Management since that visit includes continue to monitor. He reports excellent compliance with treatment. He is not having side effects.  He is exercising. He is adherent to low salt diet.   Outside blood pressures are normal at home.  He does not smoke.  Use of agents associated with hypertension: none.   --------------------------------------------------------------------------------------------------- Lipid/Cholesterol, follow-up  Last Lipid Panel: Lab Results  Component Value Date   CHOL 112 05/29/2020   LDLCALC 60 05/29/2020   HDL 27 (L) 05/29/2020   TRIG 144 05/29/2020    He was last seen for this 9 months ago.  Management since that visit includes none.  He reports excellent compliance  with treatment. He is not having side effects.   Symptoms: No appetite changes No foot ulcerations  No chest pain No chest pressure/discomfort  No dyspnea No orthopnea  No fatigue No lower extremity edema  No palpitations No paroxysmal nocturnal dyspnea  No nausea No numbness or tingling of extremity  No polydipsia No polyuria  No speech difficulty No syncope  The ASCVD Risk score ([place]., et al., 2013) failed to calculate for the following reasons:   The valid total cholesterol range is 130 to 320 mg/dL  ---------------------------------------------------------------------------------------------------      Medications: Outpatient Medications Prior to Visit  Medication Sig   aspirin 81 MG tablet Take 81 mg by mouth daily.   atorvastatin (LIPITOR) 40 MG tablet TAKE 1 TABLET (40 MG TOTAL) BY MOUTH DAILY. PLEASE SCHEDULE AN OFFICE VISIT BEFORE ANYMORE REFILLS.   Blood Glucose Monitoring Suppl (ONE TOUCH ULTRA 2) w/Device KIT Use to check sugar daily for type 2 diabetes E11.9   cetirizine (ZYRTEC) 10 MG tablet Take 10 mg by mouth daily.   escitalopram (LEXAPRO) 10 MG tablet TAKE 1 TABLET BY MOUTH EVERY DAY   fluticasone (FLONASE) 50 MCG/ACT nasal spray Place 1 spray into both nostrils daily.   glucose blood (ONETOUCH ULTRA) test strip Use to check sugar daily for type 2 diabetes E11.9   ibuprofen (ADVIL,MOTRIN) 800 MG tablet Take 1 tablet (800 mg total) by mouth every 8 (eight) hours as needed.   Lancets (ONETOUCH ULTRASOFT) lancets Use to check blood sugar daily for type 2 diabetes E11.9   metFORMIN (GLUCOPHAGE) 500 MG tablet TAKE 1 TABLET BY  MOUTH EVERY DAY   [DISCONTINUED] triamcinolone ointment (KENALOG) 0.5 % Apply 1 application topically 2 (two) times daily.   [DISCONTINUED] glucosamine-chondroitin 500-400 MG tablet Take 1 tablet by mouth 2 (two) times daily. (Patient not taking: Reported on 05/29/2020)   [DISCONTINUED] ofloxacin (FLOXIN OTIC) 0.3 % OTIC solution Place 10  drops into the left ear daily.   No facility-administered medications prior to visit.      Objective    BP (!) 156/87 (BP Location: Right Arm, Patient Position: Sitting, Cuff Size: Large)   Wt 271 lb (122.9 kg)   SpO2 100%   BMI 38.88 kg/m      General appearance: Obese male, cooperative and in no acute distress Head: Normocephalic, without obvious abnormality, atraumatic Respiratory: Respirations even and unlabored, normal respiratory rate Extremities: All extremities are intact.  Skin: Skin color, texture, turgor normal. No rashes seen  Psych: Appropriate mood and affect. Neurologic: Mental status: Alert, oriented to person, place, and time, thought content appropriate.   Results for orders placed or performed in visit on 03/08/21  POCT glycosylated hemoglobin (Hb A1C)  Result Value Ref Range   Hemoglobin A1C 7.1 (A) 4.0 - 5.6 %  POCT UA - Microalbumin  Result Value Ref Range   Microalbumin Ur, POC 50 mg/L    Assessment & Plan     1. Type 2 diabetes mellitus without complication, without long-term current use of insulin ([place]) Fairly well controlled, but a1c is trending up. He does consume quite a bit of starchy snacks and will work on improving his diet. Consider increasing metformin if not improved at follow up.   He states his home Bps much better than office readings.   2. Microalbuminuria Continue to monitor.   refill - triamcinolone ointment (KENALOG) 0.5 %; Apply 1 application topically 2 (two) times daily.  Dispense: 60 g; Refill: 3   Follow up CPE in 3-4 months.       The entirety of the information documented in the History of Present Illness, Review of Systems and Physical Exam were personally obtained by me. Portions of this information were initially documented by the CMA and reviewed by me for thoroughness and accuracy.     Lelon Huh, MD  [facility] [phone] (phone) [phone] (fax)  [place]

## 2021-03-18 ENCOUNTER — Other Ambulatory Visit: Payer: Self-pay | Admitting: Family Medicine

## 2021-03-18 DIAGNOSIS — F419 Anxiety disorder, unspecified: Secondary | ICD-10-CM

## 2021-03-18 DIAGNOSIS — E119 Type 2 diabetes mellitus without complications: Secondary | ICD-10-CM

## 2021-03-18 NOTE — Telephone Encounter (Signed)
   Notes to clinic: REQUEST FOR 90 DAYS PRESCRIPTION. DX Code Needed   Requested Prescriptions  Pending Prescriptions Disp Refills   escitalopram (LEXAPRO) 10 MG tablet [Pharmacy Med Name: ESCITALOPRAM 10 MG TABLET] 90 tablet 1    Sig: TAKE 1 TABLET BY MOUTH EVERY DAY      Psychiatry:  Antidepressants - SSRI Passed - 03/18/2021 10:33 AM      Passed - Valid encounter within last 6 months    Recent Outpatient Visits           1 week ago Type 2 diabetes mellitus without complication, without long-term current use of insulin [facility])   [facility] Birdie Sons, MD   8 months ago Cellulitis of right lower extremity   [place], [place], PA-C   8 months ago Cellulitis of right lower extremity   [facility] Carles Collet M, PA-C   9 months ago Type 2 diabetes mellitus without complication, without long-term current use of insulin [facility])   [facility] Birdie Sons, MD   1 year ago Type 2 diabetes mellitus without complication, without long-term current use of insulin [facility])   [facility] Birdie Sons, MD       Future Appointments             In 3 months Fisher, Kirstie Peri, MD [facility], [facility]

## 2021-03-27 ENCOUNTER — Other Ambulatory Visit: Payer: Self-pay | Admitting: Family Medicine

## 2021-03-27 DIAGNOSIS — E781 Pure hyperglyceridemia: Secondary | ICD-10-CM

## 2021-03-27 NOTE — Telephone Encounter (Signed)
Pt has upcoming appt 06/22/21. Pt given enough RF to last until upcoming appt.  Requested Prescriptions  Pending Prescriptions Disp Refills  . atorvastatin (LIPITOR) 40 MG tablet [Pharmacy Med Name: ATORVASTATIN 40 MG TABLET] 90 tablet 0    Sig: Take 1 tablet (40 mg total) by mouth daily. PLEASE SCHEDULE AN OFFICE VISIT BEFORE ANYMORE REFILLS.     Cardiovascular:  Antilipid - Statins Failed - 03/27/2021  1:31 PM      Failed - HDL in normal range and within 360 days    HDL  Date Value Ref Range Status  05/29/2020 27 (L) >39 mg/dL Final         Passed - Total Cholesterol in normal range and within 360 days    Cholesterol, Total  Date Value Ref Range Status  05/29/2020 112 100 - 199 mg/dL Final         Passed - LDL in normal range and within 360 days    LDL Chol Calc (NIH)  Date Value Ref Range Status  05/29/2020 60 0 - 99 mg/dL Final         Passed - Triglycerides in normal range and within 360 days    Triglycerides  Date Value Ref Range Status  05/29/2020 144 0 - 149 mg/dL Final         Passed - Patient is not pregnant      Passed - Valid encounter within last 12 months    Recent Outpatient Visits          2 weeks ago Type 2 diabetes mellitus without complication, without long-term current use of insulin ([place])   [facility] Birdie Sons, MD   8 months ago Cellulitis of right lower extremity   [place], [place], PA-C   8 months ago Cellulitis of right lower extremity   [place], [place], PA-C   10 months ago Type 2 diabetes mellitus without complication, without long-term current use of insulin [facility])   [facility] Birdie Sons, MD   1 year ago Type 2 diabetes mellitus without complication, without long-term current use of insulin [facility])   [facility] Birdie Sons, MD      Future Appointments            In 2 months Fisher, Kirstie Peri, MD [facility],  [place]

## 2021-04-12 LAB — HM DIABETES EYE EXAM

## 2021-06-21 ENCOUNTER — Other Ambulatory Visit: Payer: Self-pay | Admitting: Family Medicine

## 2021-06-21 DIAGNOSIS — E119 Type 2 diabetes mellitus without complications: Secondary | ICD-10-CM

## 2021-06-22 ENCOUNTER — Encounter: Payer: BC Managed Care – PPO | Admitting: Family Medicine

## 2021-07-02 ENCOUNTER — Other Ambulatory Visit: Payer: Self-pay | Admitting: Family Medicine

## 2021-07-02 DIAGNOSIS — E781 Pure hyperglyceridemia: Secondary | ICD-10-CM

## 2021-07-02 NOTE — Telephone Encounter (Signed)
Requested medication (s) are due for refill today: Yrs  Requested medication (s) are on the active medication list: Yes  Last refill:  03/27/21  Future visit scheduled: Yes  Notes to clinic:  Protocol indicates pt. Needs lab work.    Requested Prescriptions  Pending Prescriptions Disp Refills   atorvastatin (LIPITOR) 40 MG tablet [Pharmacy Med Name: ATORVASTATIN 40 MG TABLET] 90 tablet 0    Sig: Take 1 tablet (40 mg total) by mouth daily. PLEASE SCHEDULE AN OFFICE VISIT BEFORE ANYMORE REFILLS.     Cardiovascular:  Antilipid - Statins Failed - 07/02/2021  2:14 AM      Failed - Total Cholesterol in normal range and within 360 days    Cholesterol, Total  Date Value Ref Range Status  05/29/2020 112 100 - 199 mg/dL Final          Failed - LDL in normal range and within 360 days    LDL Chol Calc (NIH)  Date Value Ref Range Status  05/29/2020 60 0 - 99 mg/dL Final          Failed - HDL in normal range and within 360 days    HDL  Date Value Ref Range Status  05/29/2020 27 (L) >39 mg/dL Final          Failed - Triglycerides in normal range and within 360 days    Triglycerides  Date Value Ref Range Status  05/29/2020 144 0 - 149 mg/dL Final          Passed - Patient is not pregnant      Passed - Valid encounter within last 12 months    Recent Outpatient Visits           3 months ago Type 2 diabetes mellitus without complication, without long-term current use of insulin [facility])   [facility] Birdie Sons, MD   11 months ago Cellulitis of right lower extremity   [facility] Carles Collet M, [place]   12 months ago Cellulitis of right lower extremity   [facility] [place], Fabio Bering M, PA-C   1 year ago Type 2 diabetes mellitus without complication, without long-term current use of insulin [facility])   [facility] Birdie Sons, MD   1 year ago Type 2 diabetes mellitus without complication, without long-term current  use of insulin [facility])   [facility] Birdie Sons, MD       Future Appointments             In 4 weeks Fisher, Kirstie Peri, MD [facility], [facility]

## 2021-07-02 NOTE — Telephone Encounter (Signed)
Patient has appointment scheduled for 07/30/2021. Will give courtesy refill to get by until scheduled appointment.

## 2021-07-30 ENCOUNTER — Ambulatory Visit (INDEPENDENT_AMBULATORY_CARE_PROVIDER_SITE_OTHER): Payer: BC Managed Care – PPO | Admitting: Family Medicine

## 2021-07-30 ENCOUNTER — Encounter: Payer: Self-pay | Admitting: Family Medicine

## 2021-07-30 ENCOUNTER — Other Ambulatory Visit: Payer: Self-pay

## 2021-07-30 VITALS — BP 160/93 | HR 91 | Temp 98.2°F | Ht 70.0 in | Wt 271.0 lb

## 2021-07-30 DIAGNOSIS — Z1211 Encounter for screening for malignant neoplasm of colon: Secondary | ICD-10-CM

## 2021-07-30 DIAGNOSIS — I10 Essential (primary) hypertension: Secondary | ICD-10-CM | POA: Diagnosis not present

## 2021-07-30 DIAGNOSIS — Z23 Encounter for immunization: Secondary | ICD-10-CM | POA: Diagnosis not present

## 2021-07-30 DIAGNOSIS — R5383 Other fatigue: Secondary | ICD-10-CM | POA: Diagnosis not present

## 2021-07-30 DIAGNOSIS — H60599 Other noninfective acute otitis externa, unspecified ear: Secondary | ICD-10-CM | POA: Diagnosis not present

## 2021-07-30 DIAGNOSIS — Z1159 Encounter for screening for other viral diseases: Secondary | ICD-10-CM

## 2021-07-30 DIAGNOSIS — Z0000 Encounter for general adult medical examination without abnormal findings: Secondary | ICD-10-CM

## 2021-07-30 DIAGNOSIS — E119 Type 2 diabetes mellitus without complications: Secondary | ICD-10-CM | POA: Diagnosis not present

## 2021-07-30 MED ORDER — CIPROFLOXACIN-DEXAMETHASONE 0.3-0.1 % OT SUSP: 4.0000 [drp] | Freq: Two times a day (BID) | OTIC | 2 refills | Status: DC | PRN

## 2021-07-30 NOTE — Progress Notes (Signed)
Complete physical exam   Patient: Elijah Mora   DOB: [birth year]   50 y.o. Male  MRN: [id number] Visit Date: 07/30/2021  Today's healthcare provider: Lelon Huh, MD   Chief Complaint  Patient presents with   Annual Exam   Subjective    Elijah Mora is a 50 y.o. male who presents today for a complete physical exam.  He reports consuming a general diet.  Exercises regularly.  He generally feels well. He reports sleeping well. He does have additional problems to discuss today.  HPI  Pt would like to his testosterone check. Has been more fatigued lately.   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 Dr. Konrad Penta   KIDNEY STONE SURGERY     Synovial cancer     behind right knee. Off Chemo 06/1999   URETEROSCOPY WITH HOLMIUM LASER LITHOTRIPSY Right 02/10/2017   Procedure: URETEROSCOPY WITH HOLMIUM LASER LITHOTRIPSY;  Surgeon: Nickie Retort, MD;  Location: [facility] OR[facility];  Service: Urology;  Laterality: Right;   Social History   Socioeconomic History   Marital status: Married    Spouse name: Not on file   Number of children: Not on file   Years of education: Not on file   Highest education level: Not on file  Occupational History    Occupation: Works in [employer] with [employer]  Tobacco Use   Smoking status: Never   Smokeless tobacco: Never  Vaping Use   Vaping Use: Never used  Substance and Sexual Activity   Alcohol use: Yes    Alcohol/week: 4.0 standard drinks    Types: 4 Cans of beer per week    Comment: occasional use   Drug use: No   Sexual activity: Not on file  Other Topics Concern   Not on file  Social History Narrative   Not on file   Social Determinants of Health   Financial Resource Strain: Not on file  Food Insecurity: Not on file  Transportation Needs: Not on file  Physical Activity: Not on file  Stress: Not on file  Social Connections: Not on file  Intimate Partner Violence: Not on file   Family Status  Relation Name Status   Mother  Alive       cancer survivor   Father  Deceased   Sister  Alive   Sister  Alive   MGM  Alive   MGF  (Not Specified)   Other uncle Alive   Neg Hx  (Not Specified)   Family History  Problem Relation Age of Onset   Cancer Mother 1       Breast   Kidney Stones Mother    Diabetes Father    Hypertension Father  CVA Father    Breast cancer Maternal Grandmother    Lymphoma Maternal Grandfather    Kidney Stones Other    Colon cancer Neg Hx    Prostate cancer Neg Hx    No Known Allergies  Patient Care Team: Birdie Sons, MD as PCP - General (Family Medicine) Bernardo Heater, Ronda Fairly, MD (Urology) Turner Daniels, MD as Referring Physician (Internal Medicine) Laural Roes, MD as Referring Physician (Ophthalmology) Dory Peru, MD as Referring Physician (Vascular Surgery)   Medications: Outpatient Medications Prior to Visit  Medication Sig   aspirin 81 MG tablet Take 81 mg by mouth daily.   atorvastatin (LIPITOR) 40 MG tablet TAKE 1 TABLET (40 MG TOTAL) BY MOUTH DAILY. PLEASE SCHEDULE AN OFFICE VISIT BEFORE ANYMORE REFILLS.   Blood Glucose Monitoring Suppl (ONE TOUCH ULTRA 2) w/Device KIT Use to check sugar daily for type 2  diabetes E11.9   cetirizine (ZYRTEC) 10 MG tablet Take 10 mg by mouth daily.   escitalopram (LEXAPRO) 10 MG tablet TAKE 1 TABLET BY MOUTH EVERY DAY   fluticasone (FLONASE) 50 MCG/ACT nasal spray Place 1 spray into both nostrils daily.   glucose blood (ONETOUCH ULTRA) test strip Use to check sugar daily for type 2 diabetes E11.9   ibuprofen (ADVIL,MOTRIN) 800 MG tablet Take 1 tablet (800 mg total) by mouth every 8 (eight) hours as needed.   Lancets (ONETOUCH ULTRASOFT) lancets Use to check blood sugar daily for type 2 diabetes E11.9   metFORMIN (GLUCOPHAGE) 500 MG tablet TAKE 1 TABLET BY MOUTH EVERY DAY   triamcinolone ointment (KENALOG) 0.5 % Apply 1 application topically 2 (two) times daily.   No facility-administered medications prior to visit.    Review of Systems  Constitutional:  Positive for fatigue. Negative for activity change, appetite change, chills, diaphoresis, fever and unexpected weight change.  HENT: Negative.       Objective    BP (!) 160/93 (BP Location: Left Arm, Patient Position: Sitting, Cuff Size: Large)   Pulse 91   Temp 98.2 F (36.8 C) (Oral)   Ht 5' 10"  (1.778 m)   Wt 271 lb (122.9 kg)   SpO2 99%   BMI 38.88 kg/m    Physical Exam   General Appearance:    Mildly obese male. Alert, cooperative, in no acute distress, appears stated age  Head:    Normocephalic, without obvious abnormality, atraumatic  Eyes:    PERRL, conjunctiva/corneas clear, EOM's intact, fundi    benign, both eyes       Ears:    Normal TM's. Seborrhea both ear canals.   Neck:   Supple, symmetrical, trachea midline, no adenopathy;       thyroid:  No enlargement/tenderness/nodules; no carotid   bruit or JVD  Back:     Symmetric, no curvature, ROM normal, no CVA tenderness  Lungs:     Clear to auscultation bilaterally, respirations unlabored  Chest wall:    No tenderness or deformity  Heart:    Normal heart rate. Normal rhythm. No murmurs, rubs, or gallops.  S1 and S2 normal  Abdomen:      Soft, non-tender, bowel sounds active all four quadrants,    no masses, no organomegaly  Genitalia:    deferred  Rectal:    deferred  Extremities:   All extremities are intact. No cyanosis or edema  Pulses:   2+ and symmetric all extremities  Skin:   Skin color, texture, turgor normal, no rashes or lesions  Lymph nodes:   Cervical,  supraclavicular, and axillary nodes normal  Neurologic:   CNII-XII intact. Normal strength, sensation and reflexes      throughout     Last depression screening scores PHQ 2/9 Scores 07/30/2021 03/08/2021 09/16/2019  PHQ - 2 Score 0 0 0  PHQ- 9 Score 4 2 -   Last fall risk screening Fall Risk  07/30/2021  Falls in the past year? 0  Number falls in past yr: 0  Injury with Fall? 0  Risk for fall due to : No Fall Risks  Follow up Falls evaluation completed   Last Audit-C alcohol use screening Alcohol Use Disorder Test (AUDIT) 03/08/2021  1. How often do you have a drink containing alcohol? 4  2. How many drinks containing alcohol do you have on a typical day when you are drinking? 0  3. How often do you have six or more drinks on one occasion? 0  AUDIT-C Score 4   A score of 3 or more in women, and 4 or more in men indicates increased risk for alcohol abuse, EXCEPT if all of the points are from question 1   No results found for any visits on 07/30/21.  Assessment & Plan    Routine Health Maintenance and Physical Exam  Exercise Activities and Dietary recommendations  Goals   None     Immunization History  Administered Date(s) Administered   Influenza,inj,Quad PF,6+ Mos 09/08/2015, 07/12/2019   Influenza-Unspecified 07/18/2017, 07/17/2018, 06/17/2020   PFIZER(Purple Top)SARS-COV-2 Vaccination 11/27/2019, 12/24/2019    Health Maintenance  Topic Date Due   Pneumococcal Vaccine 41-54 Years old (1 - PCV) Never done   FOOT EXAM  Never done   HIV Screening  Never done   Hepatitis C Screening  Never done   TETANUS/TDAP  Never done    COLONOSCOPY (Pts 45-71yr Insurance coverage will need to be confirmed)  Never done   COVID-19 Vaccine (3 - Pfizer risk series) 01/21/2020   INFLUENZA VACCINE  04/26/2021   HEMOGLOBIN A1C  09/07/2021   URINE MICROALBUMIN  03/08/2022   OPHTHALMOLOGY EXAM  04/12/2022   HPV VACCINES  Aged Out    Discussed health benefits of physical activity, and encouraged him to engage in regular exercise appropriate for his age and condition.    1. Annual physical exam  - EKG 12-Lead  2. Colon cancer screening Advised to check with insurance regarding coverage before scheduling this year.  - Ambulatory referral to Gastroenterology  3. Acute seborrheic otitis externa - ciprofloxacin-dexamethasone (CIPRODEX) OTIC suspension; Place 4 drops into both ears 2 (two) times daily as needed.  Dispense: 15 mL; Refill: 2 Was previously successfully treated by Dr. JKathyrn Sheriffwith above.   4. Need for Tdap vaccination  - Tdap vaccine greater than or equal to 7yo IM  5. Need for hepatitis C screening test  - Hepatitis C antibody  6. Type 2 diabetes mellitus without complication, without long-term current use of insulin (HCC)  - EKG 12-Lead - Comprehensive metabolic panel - Lipid panel - Hemoglobin A1c  7. Morbid obesity ([place] Encourage healthy diet and regular exercise.   8. Other fatigue  - CBC - TSH - Testosterone  9. Need for influenza vaccination  - Flu Vaccine QUAD 6+ mos PF IM (Fluarix Quad PF)   10. Hypertension Start antihypertensive after reviewing labs.     The entirety of the information documented in the History of Present Illness, Review of Systems and Physical Exam were personally obtained by me. Portions of this information were initially documented by  the CMA and reviewed by me for thoroughness and accuracy.     Lelon Huh, MD  [facility] [phone] (phone) [phone] (fax)  [place]

## 2021-07-30 NOTE — Patient Instructions (Signed)
Please review the attached list of medications and notify my office if there are any errors.   It is recommended to engage in 150 minutes of moderate exercise every week.

## 2021-07-31 LAB — TESTOSTERONE: Testosterone: 157 ng/dL — ABNORMAL LOW (ref 264–916)

## 2021-07-31 LAB — CBC
Hematocrit: 43.3 % (ref 37.5–51.0)
Hemoglobin: 15.2 g/dL (ref 13.0–17.7)
MCH: 31.6 pg (ref 26.6–33.0)
MCHC: 35.1 g/dL (ref 31.5–35.7)
MCV: 90 fL (ref 79–97)
Platelets: 247 10*3/uL (ref 150–450)
RBC: 4.81 x10E6/uL (ref 4.14–5.80)
RDW: 12.6 % (ref 11.6–15.4)
WBC: 8.3 10*3/uL (ref 3.4–10.8)

## 2021-07-31 LAB — TSH: TSH: 2.13 u[IU]/mL (ref 0.450–4.500)

## 2021-07-31 LAB — COMPREHENSIVE METABOLIC PANEL
ALT: 57 IU/L — ABNORMAL HIGH (ref 0–44)
AST: 26 IU/L (ref 0–40)
Albumin/Globulin Ratio: 2 (ref 1.2–2.2)
Albumin: 4.5 g/dL (ref 4.0–5.0)
Alkaline Phosphatase: 102 IU/L (ref 44–121)
BUN/Creatinine Ratio: 13 (ref 9–20)
BUN: 14 mg/dL (ref 6–24)
Bilirubin Total: 0.6 mg/dL (ref 0.0–1.2)
CO2: 24 mmol/L (ref 20–29)
Calcium: 9.5 mg/dL (ref 8.7–10.2)
Chloride: 105 mmol/L (ref 96–106)
Creatinine, Ser: 1.08 mg/dL (ref 0.76–1.27)
Globulin, Total: 2.3 g/dL (ref 1.5–4.5)
Glucose: 138 mg/dL — ABNORMAL HIGH (ref 70–99)
Potassium: 4.1 mmol/L (ref 3.5–5.2)
Sodium: 142 mmol/L (ref 134–144)
Total Protein: 6.8 g/dL (ref 6.0–8.5)
eGFR: 85 mL/min/{1.73_m2} (ref >59)

## 2021-07-31 LAB — HEMOGLOBIN A1C
Est. average glucose Bld gHb Est-mCnc: 163 mg/dL
Hgb A1c MFr Bld: 7.3 % — ABNORMAL HIGH (ref 4.8–5.6)

## 2021-07-31 LAB — LIPID PANEL
Chol/HDL Ratio: 3.6 ratio (ref 0.0–5.0)
Cholesterol, Total: 105 mg/dL (ref 100–199)
HDL: 29 mg/dL — ABNORMAL LOW (ref >39)
LDL Chol Calc (NIH): 44 mg/dL (ref 0–99)
Triglycerides: 198 mg/dL — ABNORMAL HIGH (ref 0–149)
VLDL Cholesterol Cal: 32 mg/dL (ref 5–40)

## 2021-07-31 LAB — HEPATITIS C ANTIBODY: Hep C Virus Ab: <0.1 s/co ratio (ref 0.0–0.9)

## 2021-08-06 DIAGNOSIS — G4733 Obstructive sleep apnea (adult) (pediatric): Secondary | ICD-10-CM | POA: Diagnosis not present

## 2021-08-09 ENCOUNTER — Telehealth: Payer: Self-pay

## 2021-08-09 DIAGNOSIS — E291 Testicular hypofunction: Secondary | ICD-10-CM

## 2021-08-09 NOTE — Telephone Encounter (Signed)
-----   Message from Birdie Sons, MD sent at 08/04/2021 12:13 PM EST ----- Testosterone levels are low, rest of labs are normal. Would probably feel better with testosterone replacement. If interested then need to repeat testosterone levels to prove to insurance that its needed.

## 2021-08-09 NOTE — Telephone Encounter (Signed)
Have printed order, can stop by any morning, does not need to be fasting.

## 2021-08-09 NOTE — Telephone Encounter (Signed)
Pt advised. He agreed to get a repeat testosterone level.   Thanks,   -Mickel Baas

## 2021-08-09 NOTE — Telephone Encounter (Signed)
Patient advised. Lab order placed up front.

## 2021-08-11 DIAGNOSIS — E291 Testicular hypofunction: Secondary | ICD-10-CM | POA: Diagnosis not present

## 2021-08-13 ENCOUNTER — Other Ambulatory Visit: Payer: Self-pay | Admitting: Family Medicine

## 2021-08-13 DIAGNOSIS — E291 Testicular hypofunction: Secondary | ICD-10-CM

## 2021-08-13 LAB — TESTOSTERONE,FREE AND TOTAL
Testosterone, Free: 10.3 pg/mL (ref 6.8–21.5)
Testosterone: 146 ng/dL — ABNORMAL LOW (ref 264–916)

## 2021-08-13 LAB — PROLACTIN: Prolactin: 9.9 ng/mL (ref 4.0–15.2)

## 2021-08-13 MED ORDER — TESTOSTERONE 20.25 MG/ACT (1.62%) TD GEL: 2.0000 | Freq: Every day | TRANSDERMAL | 1 refills | Status: DC

## 2021-08-17 ENCOUNTER — Telehealth: Payer: Self-pay

## 2021-08-17 NOTE — Telephone Encounter (Signed)
CALLED PATIENT NO ANSWER LEFT VOICEMAIL FOR A CALL BACK LETTER SENT 

## 2021-08-24 ENCOUNTER — Ambulatory Visit: Payer: Self-pay

## 2021-08-24 ENCOUNTER — Emergency Department
Admission: EM | Admit: 2021-08-24 | Discharge: 2021-08-24 | Disposition: A | Discharge status: Home / Self Care | Payer: BC Managed Care – PPO | Attending: Student in an Organized Health Care Education/Training Program | Admitting: Student in an Organized Health Care Education/Training Program

## 2021-08-24 ENCOUNTER — Other Ambulatory Visit: Payer: Self-pay

## 2021-08-24 DIAGNOSIS — M549 Dorsalgia, unspecified: Secondary | ICD-10-CM | POA: Diagnosis not present

## 2021-08-24 DIAGNOSIS — Z85830 Personal history of malignant neoplasm of bone: Secondary | ICD-10-CM | POA: Insufficient documentation

## 2021-08-24 DIAGNOSIS — Z85060 Personal history of malignant carcinoid tumor of small intestine: Secondary | ICD-10-CM | POA: Diagnosis not present

## 2021-08-24 DIAGNOSIS — M5441 Lumbago with sciatica, right side: Secondary | ICD-10-CM | POA: Insufficient documentation

## 2021-08-24 DIAGNOSIS — Z7982 Long term (current) use of aspirin: Secondary | ICD-10-CM | POA: Diagnosis not present

## 2021-08-24 DIAGNOSIS — I10 Essential (primary) hypertension: Secondary | ICD-10-CM | POA: Diagnosis not present

## 2021-08-24 DIAGNOSIS — Z7984 Long term (current) use of oral hypoglycemic drugs: Secondary | ICD-10-CM | POA: Diagnosis not present

## 2021-08-24 DIAGNOSIS — M545 Low back pain, unspecified: Secondary | ICD-10-CM | POA: Diagnosis not present

## 2021-08-24 DIAGNOSIS — E119 Type 2 diabetes mellitus without complications: Secondary | ICD-10-CM | POA: Insufficient documentation

## 2021-08-24 LAB — SPECIMEN STATUS REPORT

## 2021-08-24 LAB — TESTOSTERONE, FREE: Testosterone, Free: 4.1 pg/mL — ABNORMAL LOW (ref 6.8–21.5)

## 2021-08-24 MED ORDER — CYCLOBENZAPRINE HCL 10 MG PO TABS: 10.0000 mg | ORAL_TABLET | Freq: Three times a day (TID) | ORAL | 0 refills | Status: DC | PRN

## 2021-08-24 MED ORDER — DEXAMETHASONE SODIUM PHOSPHATE 10 MG/ML IJ SOLN
10.0000 mg | Freq: Once | INTRAMUSCULAR | Status: AC
  Administered 2021-08-24: 10 mg via INTRAMUSCULAR

## 2021-08-24 MED ORDER — OXYCODONE-ACETAMINOPHEN 5-325 MG PO TABS: 1.0000 | ORAL_TABLET | Freq: Four times a day (QID) | ORAL | 0 refills | Status: DC | PRN

## 2021-08-24 MED ORDER — OXYCODONE HCL 5 MG PO TABS
10.0000 mg | ORAL_TABLET | Freq: Once | ORAL | Status: AC
  Administered 2021-08-24: 10 mg via ORAL

## 2021-08-24 MED ORDER — PREDNISONE 10 MG (21) PO TBPK: ORAL_TABLET | ORAL | 0 refills | Status: DC

## 2021-08-24 NOTE — ED Notes (Signed)
Pt via POV from home. Pt c/o lower back pain, more so on the L side that radiates down to his L leg. No tenderness noted down his spine. Pt has a hx of sciatica and states that this feels similar. Pt is A&Ox4 but seems to be in fair amount of pain. States that he cannot get comfortable. States that he took 2 200mg  Ibuprofen at 7:20am and 10mg  of Flexeril at around 8:10am.

## 2021-08-24 NOTE — Discharge Instructions (Signed)
Please schedule follow up with primary care/neurology. Take the medication as prescribed and as discussed. Return to the ER for symptoms that change or worsen if unable to schedule an appointment.

## 2021-08-24 NOTE — Telephone Encounter (Signed)
Pt. And wife calling. Pt. Is laying on floor with severe back pain. Cannot get up. Took Motrin and "an old Flexeril." Pain shoots down right side of beck and into right leg. Wife unable to get him up.Instructed to call EMS, verbalizes understanding.    Reason for Disposition  Sounds like a life-threatening emergency to the triager  Answer Assessment - Initial Assessment Questions 1. ONSET: "When did the pain begin?"      Today 2. LOCATION: "Where does it hurt?" (upper, mid or lower back)     Low back 3. SEVERITY: "How bad is the pain?"  (e.g., Scale 1-10; mild, moderate, or severe)   - MILD (1-3): doesn't interfere with normal activities    - MODERATE (4-7): interferes with normal activities or awakens from sleep    - SEVERE (8-10): excruciating pain, unable to do any normal activities      9 4. PATTERN: "Is the pain constant?" (e.g., yes, no; constant, intermittent)      Constant 5. RADIATION: "Does the pain shoot into your legs or elsewhere?"     Yes 6. CAUSE:  "What do you think is causing the back pain?"      Sciatica 7. BACK OVERUSE:  "Any recent lifting of heavy objects, strenuous work or exercise?"     Yes 8. MEDICATIONS: "What have you taken so far for the pain?" (e.g., nothing, acetaminophen, NSAIDS)     Motrin, flexeril 9. NEUROLOGIC SYMPTOMS: "Do you have any weakness, numbness, or problems with bowel/bladder control?"     No 10. OTHER SYMPTOMS: "Do you have any other symptoms?" (e.g., fever, abdominal pain, burning with urination, blood in urine)       No 11. PREGNANCY: "Is there any chance you are pregnant?" (e.g., yes, no; LMP)       N/a  Protocols used: Back Pain-A-AH

## 2021-08-24 NOTE — ED Provider Notes (Signed)
[facility] Emergency Department Provider Note ____________________________________________  Time seen: Approximately 10:23 AM  I have reviewed the triage vital signs and the nursing notes.  HISTORY  Chief Complaint No chief complaint on file.   HPI Elijah Mora is a 50 y.o. male presents to the emergency department for treatment and evaluation of low back pain that started 3 days ago.  No specific injury but may have hurt it somehow while decorating for Christmas.  Pain worsened this morning after getting out of bed.  No relief with ibuprofen and Flexeril.  Pain radiates from his low back into his left buttock toward the knee. 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   Sleep apnea    CPAP    Patient Active Problem List   Diagnosis Date Noted   Hypogonadism in male 08/13/2021   Type 2 diabetes mellitus without complication, without long-term current use of insulin ([place]) 05/29/2020   Elevated blood pressure reading 05/29/2020   Morbid obesity ([place]) 05/29/2020   Lumbar radiculopathy 12/06/2018   Localized edema 02/20/2018   Femoral-popliteal bypass graft occlusion, 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   Acute anxiety 09/08/2015   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   Dizziness 05/08/2015   Awareness of heartbeats 05/08/2015   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 Dr. Konrad Penta   KIDNEY STONE SURGERY     Synovial cancer     behind right knee. Off Chemo 06/1999   URETEROSCOPY WITH HOLMIUM LASER LITHOTRIPSY Right 02/10/2017   Procedure: URETEROSCOPY WITH HOLMIUM LASER LITHOTRIPSY;  Surgeon: Nickie Retort, MD;  Location: [facility] ORS;  Service: Urology;  Laterality: Right;    Prior to Admission medications   Medication Sig Start Date End Date Taking? Authorizing Provider  cyclobenzaprine (FLEXERIL) 10 MG tablet Take 1 tablet (10 mg total) by mouth 3 (three) times daily as needed. 08/24/21  Yes Anab Vivar B, FNP  oxyCODONE-acetaminophen (PERCOCET) 5-325 MG tablet Take 1 tablet by mouth every 6 (six) hours as needed. 08/24/21 08/24/22 Yes Dawit Tankard B, FNP  predniSONE (STERAPRED UNI-PAK 21 TAB) 10 MG (21) TBPK tablet Take 6 tablets on the first day and decrease by 1 tablet each day until finished. 08/24/21  Yes Ayriel Texidor B, FNP  aspirin 81 MG tablet Take 81 mg by mouth daily.    [provider]  atorvastatin (LIPITOR) 40 MG tablet TAKE 1 TABLET (40 MG TOTAL) BY MOUTH DAILY. PLEASE SCHEDULE AN OFFICE VISIT BEFORE ANYMORE REFILLS. 07/02/21   Birdie Sons, MD  Blood Glucose Monitoring Suppl (ONE TOUCH ULTRA 2) w/Device KIT Use to check sugar daily for type 2 diabetes E11.9 02/07/20   Birdie Sons, MD  cetirizine (ZYRTEC) 10 MG tablet Take 10 mg by mouth daily.  [provider]  ciprofloxacin-dexamethasone (CIPRODEX) OTIC suspension Place 4 drops into both ears 2 (two) times daily as needed. 07/30/21   Birdie Sons, MD  escitalopram (LEXAPRO) 10 MG tablet TAKE 1 TABLET BY MOUTH EVERY DAY 03/18/21   Birdie Sons, MD  fluticasone [facility]) 50 MCG/ACT nasal spray Place 1 spray into both nostrils daily.    [provider]  glucose blood (ONETOUCH ULTRA) test strip Use to  check sugar daily for type 2 diabetes E11.9 02/07/20   Birdie Sons, MD  ibuprofen (ADVIL,MOTRIN) 800 MG tablet Take 1 tablet (800 mg total) by mouth every 8 (eight) hours as needed. 12/31/15   Mar Daring, PA-C  Lancets [facility] ULTRASOFT) lancets Use to check blood sugar daily for type 2 diabetes E11.9 02/07/20   Birdie Sons, MD  metFORMIN (GLUCOPHAGE) 500 MG tablet TAKE 1 TABLET BY MOUTH EVERY DAY 06/21/21   Birdie Sons, MD  Testosterone 20.25 MG/ACT (1.62%) GEL Place 2 Pump onto the skin daily. 08/13/21   Birdie Sons, MD  triamcinolone ointment (KENALOG) 0.5 % Apply 1 application topically 2 (two) times daily. 03/08/21   Birdie Sons, MD    Allergies Patient has no known allergies.  Family History  Problem Relation Age of Onset   Cancer Mother 44       Breast   Kidney Stones Mother    Diabetes Father    Hypertension Father    CVA Father    Breast cancer Maternal Grandmother    Lymphoma Maternal Grandfather    Kidney Stones Other    Colon cancer Neg Hx    Prostate cancer Neg Hx     Social History Social History   Tobacco Use   Smoking status: Never   Smokeless tobacco: Never  Vaping Use   Vaping Use: Never used  Substance Use Topics   Alcohol use: Yes    Alcohol/week: 4.0 standard drinks    Types: 4 Cans of beer per week    Comment: occasional use   Drug use: No    Review of Systems Constitutional: Well appearing. Respiratory: Negative for dyspnea. Cardiovascular: Negative for change in skin temperature or color. Musculoskeletal:   Negative for chronic steroid use   Negative for trauma in the presence of osteoporosis  Negative for age over 64 and trauma.  Negative for constitutional symptoms, or history of cancer  Negative for pain worse at night. Skin: Negative for rash, lesion, or wound.  Genitourinary: Negative for urinary retention. Rectal: Negative for fecal incontinence or new onset constipation/bowel habit  changes. Hematological/Immunilogical: Negative for immunosuppression, IV drug use, or fever Neurological: Positive  for burning, tingling, numb, electric, radiating pain in the right lower extremity.                        Negative for saddle anesthesia.                        Negative for focal neurologic deficit, progressive or disabling symptoms             Negative for saddle anesthesia. ____________________________________________   PHYSICAL EXAM:  VITAL SIGNS: ED Triage Vitals  Enc Vitals Group     BP 08/24/21 0955 (!) 167/90     Pulse Rate 08/24/21 0955 72     Resp 08/24/21 0955 19     Temp 08/24/21 0955 98.6 F (37 C)     Temp Source  08/24/21 0955 Oral     SpO2 08/24/21 0955 98 %     Weight 08/24/21 0955 260 lb (117.9 kg)     Height 08/24/21 0955 5' 10"  (1.778 m)     Head Circumference --      Peak Flow --      Pain Score 08/24/21 0941 10     Pain Loc --      Pain Edu? --      Excl. in [place]? --     Constitutional: Alert and oriented. Well appearing and in no acute distress. Eyes: Conjunctivae are clear without discharge or drainage.  Head: Atraumatic. Neck: Full, active range of motion. Respiratory: Respirations even and unlabored. Musculoskeletal: Decreased ROM of the back and extremities, Strength 5/5 of the lower extremities as tested. Neurologic: Reflexes of the lower extremities are 2+. Positive straight leg raise on the right side. Skin: Atraumatic.  Psychiatric: Behavior and affect are normal.  ____________________________________________   LABS (all labs ordered are listed, but only abnormal results are displayed)  Labs Reviewed - No data to display ____________________________________________  RADIOLOGY  Not indicated. ____________________________________________   PROCEDURES  Procedure(s) performed:  Procedures ____________________________________________   INITIAL IMPRESSION / ASSESSMENT AND PLAN / ED COURSE  Elijah Mora is a 50  y.o. male presenting to the emergency department for treatment and evaluation of acute onset right side low back pain that radiates down the posterior right leg to the foot.  Patient has had similar symptoms in the past but never quite this bad.  He feels that onset is due to moving some furniture and helping decorate for Christmas.  No relief with Flexeril.  IM Decadron and Roxicodone given.  After 1 hour, patient was able to stand and sit without significant pain.  He is able to ambulate and bear weight.  He will be discharged home with a prescription for prednisone that he will start tomorrow, refill of Flexeril, and Percocet for severe pain.  He was advised not to drive or operate heavy machinery when taking either the Flexeril or the Percocet.  He was encouraged to follow-up with primary care or neurology.  He is to return to the emergency department for symptoms of change or worsen if he is unable to schedule appointment.  Medications  dexamethasone (DECADRON) injection 10 mg (10 mg Intramuscular Given 08/24/21 1116)  oxyCODONE (Oxy IR/ROXICODONE) immediate release tablet 10 mg (10 mg Oral Given 08/24/21 1119)    ED Discharge Orders          Ordered    cyclobenzaprine (FLEXERIL) 10 MG tablet  3 times daily PRN        08/24/21 1249    predniSONE (STERAPRED UNI-PAK 21 TAB) 10 MG (21) TBPK tablet        08/24/21 1249    oxyCODONE-acetaminophen (PERCOCET) 5-325 MG tablet  Every 6 hours PRN        08/24/21 1249               As part of my medical decision making, I reviewed the following data within the [place] Controlled Substance Database   Pertinent labs & imaging results that were available during my care of the patient were reviewed by me and considered in my medical decision making (see chart for details).   _________________________________________   FINAL CLINICAL IMPRESSION(S) / ED DIAGNOSES   Final diagnoses:  Acute right-sided back pain with  sciatica     If controlled substance prescribed during this  visit, 12 month history viewed on the [facility] prior to issuing an initial prescription for Schedule II or III opiod.    Victorino Dike, FNP 08/24/21 1319    Merlyn Lot, MD 08/24/21 [phone]

## 2021-08-24 NOTE — ED Notes (Signed)
Provider informed about patient's discharge vital signs.

## 2021-08-24 NOTE — ED Triage Notes (Signed)
Pt reports pain in back since Sunday, possibly hurt back while decorating for christmas. Pain worsened this am after getting out of bed. Reports problems with back in past.   Took ibuprofen this am. Took flexiril.

## 2021-08-24 NOTE — ED Notes (Signed)
Pt able to stand freely next to chair without difficulty. Pt states pain has significantly improved.

## 2021-08-24 NOTE — ED Triage Notes (Signed)
Pt arrives via ems from home, pt states that he is having pain in his left buttock that radiates into his left knee, ems reports +pedal pulses

## 2021-09-05 DIAGNOSIS — G4733 Obstructive sleep apnea (adult) (pediatric): Secondary | ICD-10-CM | POA: Diagnosis not present

## 2021-09-14 ENCOUNTER — Ambulatory Visit: Payer: BC Managed Care – PPO | Admitting: Family Medicine

## 2021-09-21 ENCOUNTER — Ambulatory Visit: Payer: Self-pay

## 2021-09-21 ENCOUNTER — Other Ambulatory Visit: Payer: Self-pay | Admitting: Family Medicine

## 2021-09-21 DIAGNOSIS — F419 Anxiety disorder, unspecified: Secondary | ICD-10-CM

## 2021-09-21 NOTE — Telephone Encounter (Signed)
°  Chief Complaint: RLE pain Symptoms: RLE pain from butt down leg 8/10 Frequency: this morning Pertinent Negatives: Patient denies numbness or swelling Disposition: [] ED /[] Urgent Care (no appt availability in office) / [x] Appointment(In office/virtual)/ []  [facility]/ [] Home Care/ [] Refused Recommended Disposition  Additional Notes: Pt states he had this happen about a month ago and went to ED and was given IM decadron and helped but now pain is back and requesting another shot. Pt was scheduled appt 09/22/21 at 1420 with Dr. Ky Barban. Advised if pt gets more severe can go to UC to be seen, just to call and cancel appt if needed.    Reason for Disposition  [1] SEVERE pain (e.g., excruciating, unable to do any normal activities) AND [2] not improved after 2 hours of pain medicine  Answer Assessment - Initial Assessment Questions 1. ONSET: "When did the pain start?"      This morning 2. LOCATION: "Where is the pain located?"      R leg from buttock down 3. PAIN: "How bad is the pain?"    (Scale 1-10; or mild, moderate, severe)   -  MILD (1-3): doesn't interfere with normal activities    -  MODERATE (4-7): interferes with normal activities (e.g., work or school) or awakens from sleep, limping    -  SEVERE (8-10): excruciating pain, unable to do any normal activities, unable to walk     8 6. OTHER SYMPTOMS: "Do you have any other symptoms?" (e.g., chest pain, back pain, breathing difficulty, swelling, rash, fever, numbness, weakness)     no  Protocols used: Leg Pain-A-AH

## 2021-09-22 ENCOUNTER — Encounter: Payer: Self-pay | Admitting: Family Medicine

## 2021-09-22 ENCOUNTER — Ambulatory Visit: Payer: BC Managed Care – PPO | Admitting: Family Medicine

## 2021-09-22 ENCOUNTER — Other Ambulatory Visit: Payer: Self-pay

## 2021-09-22 VITALS — BP 179/116 | HR 94 | Temp 98.3°F | Resp 16 | Wt 268.0 lb

## 2021-09-22 DIAGNOSIS — T148XXA Other injury of unspecified body region, initial encounter: Secondary | ICD-10-CM | POA: Diagnosis not present

## 2021-09-22 MED ORDER — OXYCODONE-ACETAMINOPHEN 5-325 MG PO TABS: 1.0000 | ORAL_TABLET | Freq: Four times a day (QID) | ORAL | 0 refills | Status: DC | PRN

## 2021-09-22 MED ORDER — PREDNISONE 20 MG PO TABS: 40.0000 mg | ORAL_TABLET | Freq: Every day | ORAL | 0 refills | Status: AC

## 2021-09-22 NOTE — Telephone Encounter (Signed)
Requested medications are due for refill today.  yes  Requested medications are on the active medications list.  yes  Last refill. 03/18/2021  Future visit scheduled.   yes  Notes to clinic.  Pharmacy needs Dx code.    Requested Prescriptions  Pending Prescriptions Disp Refills   escitalopram (LEXAPRO) 10 MG tablet [Pharmacy Med Name: ESCITALOPRAM 10 MG TABLET] 90 tablet 1    Sig: TAKE 1 TABLET BY MOUTH EVERY DAY     Psychiatry:  Antidepressants - SSRI Passed - 09/21/2021  7:11 PM      Passed - Valid encounter within last 6 months    Recent Outpatient Visits           1 month ago Annual physical exam   [facility] Birdie Sons, MD   6 months ago Type 2 diabetes mellitus without complication, without long-term current use of insulin [facility])   [facility] Birdie Sons, MD   1 year ago Cellulitis of right lower extremity   [facility] Carles Collet M, [place]   1 year ago Cellulitis of right lower extremity   [facility] [place], Fabio Bering M, PA-C   1 year ago Type 2 diabetes mellitus without complication, without long-term current use of insulin [facility])   [facility] Birdie Sons, MD       Future Appointments             Today Rumball, Jake Church, DO [facility], [place]   In 1 month Fisher, Kirstie Peri, MD [facility], [facility]

## 2021-09-22 NOTE — Progress Notes (Signed)
° ° °  SUBJECTIVE:   CHIEF COMPLAINT / HPI:   BACK PAIN - seen in ED 11/29 with improvement from decadron IM. - XR 12/2015 with mild disc flattening at L4-5 and L5-S1. 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 Location: R lumbar/buttock Onset: sudden Quality: sharp Frequency:  constant Radiation: R leg below the knee Aggravating factors: sitting Alleviating factors: standing, laying, percocet Treatments attempted: percocet, rest, flexeril, ibuprofen Relief with NSAIDs?: no Nighttime pain:  yes Paresthesias / decreased sensation:  no Bowel / bladder incontinence:  no Fevers:  no Dysuria / urinary frequency:  no   OBJECTIVE:   BP (!) 179/116 (BP Location: Left Arm, Patient Position: Sitting, Cuff Size: Large)    Pulse 94    Temp 98.3 F (36.8 C) (Temporal)    Resp 16    Wt 268 lb (121.6 kg)    SpO2 97%    BMI 38.45 kg/m   Gen: in mild distress, non toxic appearing Card: Reg rate Lungs: Comfortable WOB on RA MSK: no midline spinal tenderness. Paravertebral hypertonicity, TTP along R lumbar spine. TTP over piriformis. NonTTP over greater trochanter. Limited lumbar flexion and R sidebending due to pain. Full AROM in extension and L sidebending. Neurovascularly intact. Ext: WWP, no edema   ASSESSMENT/PLAN:   Lumbar muscle strain Acute. No red flags to warrant emergent imaging. No relief with NSAIDs, heat, muscle relaxer and with previous relief with decadron, will provide steroid burst. Continue heat and percocet for severe pain. Return and emergency precautions discussed.   Myles Gip, DO

## 2021-09-29 ENCOUNTER — Other Ambulatory Visit: Payer: Self-pay | Admitting: Family Medicine

## 2021-09-29 DIAGNOSIS — E781 Pure hyperglyceridemia: Secondary | ICD-10-CM

## 2021-10-06 DIAGNOSIS — G4733 Obstructive sleep apnea (adult) (pediatric): Secondary | ICD-10-CM | POA: Diagnosis not present

## 2021-10-15 ENCOUNTER — Other Ambulatory Visit: Payer: Self-pay | Admitting: Family Medicine

## 2021-10-15 DIAGNOSIS — E291 Testicular hypofunction: Secondary | ICD-10-CM

## 2021-10-15 NOTE — Telephone Encounter (Signed)
Requested medication (s) are due for refill today: yes  Requested medication (s) are on the active medication list: yes  Last refill:  08/13/21 with one refill  Future visit scheduled: 10/26/21  Notes to clinic:  There is not a protocol to follow for this med, please assess.  Requested Prescriptions  Pending Prescriptions Disp Refills   Testosterone 20.25 MG/ACT (1.62%) GEL [Pharmacy Med Name: TESTOSTERONE 1.62% GEL PUMP] 75 g 1    Sig: Place 2 Pump onto the skin daily.     Off-Protocol Failed - 10/15/2021  4:28 PM      Failed - Medication not assigned to a protocol, review manually.      Passed - Valid encounter within last 12 months    Recent Outpatient Visits           3 weeks ago Muscle strain   [place]ice, Jake Church, DO   2 months ago Annual physical exam   [facility] Birdie Sons, MD   7 months ago Type 2 diabetes mellitus without complication, without long-term current use of insulin [facility])   [facility] Birdie Sons, MD   1 year ago Cellulitis of right lower extremity   [facility] Carles Collet M, [place]   1 year ago Cellulitis of right lower extremity   [facility] Trinna Post, [place]       Future Appointments             In 1 week Caryn Section, Kirstie Peri, MD [facility], [place]

## 2021-10-25 NOTE — Progress Notes (Signed)
Established patient visit   Patient: Elijah Mora   DOB: 1970-12-19   51 y.o. Male  MRN: [id number] Visit Date: 10/26/2021  Today's healthcare provider: Lelon Huh, MD   No chief complaint on file.  Subjective    HPI  Patient is a 51 year old male who presents for follow up on hypogonadism.  His last Testosterone level was checked and 08/11/21 and was low at 146. He is currently using topical testosterone gel daily.  Patient states he does not feel any different since starting the medication.   He complains of still feeling fatigued, having lack of concentration, inability to lose weight despite dietary and exercise changes.    He is also due for follow up diabetes. Doing well current medications. Home sugars mostly 130s-180s Lab Results  Component Value Date   HGBA1C 7.3 (H) 07/30/2021    Medications: Outpatient Medications Prior to Visit  Medication Sig   aspirin 81 MG tablet Take 81 mg by mouth daily.   atorvastatin (LIPITOR) 40 MG tablet TAKE 1 TABLET (40 MG TOTAL) BY MOUTH DAILY. PLEASE SCHEDULE AN OFFICE VISIT BEFORE ANYMORE REFILLS.   Blood Glucose Monitoring Suppl (ONE TOUCH ULTRA 2) w/Device KIT Use to check sugar daily for type 2 diabetes E11.9   cetirizine (ZYRTEC) 10 MG tablet Take 10 mg by mouth daily.   cyclobenzaprine (FLEXERIL) 10 MG tablet Take 1 tablet (10 mg total) by mouth 3 (three) times daily as needed.   escitalopram (LEXAPRO) 10 MG tablet TAKE 1 TABLET BY MOUTH EVERY DAY   fluticasone (FLONASE) 50 MCG/ACT nasal spray Place 1 spray into both nostrils daily.   glucose blood (ONETOUCH ULTRA) test strip Use to check sugar daily for type 2 diabetes E11.9   ibuprofen (ADVIL,MOTRIN) 800 MG tablet Take 1 tablet (800 mg total) by mouth every 8 (eight) hours as needed.   Lancets (ONETOUCH ULTRASOFT) lancets Use to check blood sugar daily for type 2 diabetes E11.9   metFORMIN (GLUCOPHAGE) 500 MG tablet TAKE 1 TABLET BY MOUTH EVERY DAY    oxyCODONE-acetaminophen (PERCOCET) 5-325 MG tablet Take 1 tablet by mouth every 6 (six) hours as needed.   Testosterone 20.25 MG/ACT (1.62%) GEL PLACE 2 PUMP ONTO THE SKIN DAILY.   triamcinolone ointment (KENALOG) 0.5 % Apply 1 application topically 2 (two) times daily.   No facility-administered medications prior to visit.    Review of Systems  Constitutional:  Positive for fatigue.  Psychiatric/Behavioral:  Positive for decreased concentration. The patient is nervous/anxious.       Objective    BP (!) 162/96 (BP Location: Left Arm, Patient Position: Sitting, Cuff Size: Large)    Pulse 94    Temp 98.6 F (37 C) (Oral)    Wt 270 lb (122.5 kg)    SpO2 100%    BMI 38.74 kg/m  Vitals:   10/26/21 0844 10/26/21 0845  BP: (!) 175/98 (!) 162/96  Pulse: 94   Temp: 98.6 F (37 C)   TempSrc: Oral   SpO2: 100%   Weight: 270 lb (122.5 kg)      Physical Exam  General appearance: Mildly obese male, cooperative and in no acute distress Head: Normocephalic, without obvious abnormality, atraumatic Respiratory: Respirations even and unlabored, normal respiratory rate Extremities: All extremities are intact.  Skin: Skin color, texture, turgor normal. No rashes seen  Psych: Appropriate mood and affect. Neurologic: Mental status: Alert, oriented to person, place, and time, thought content appropriate.    Assessment & Plan  1. Hypogonadism in male Using testosterone gel consistently without side effects, but hasn't felt any better since starting 2 months ago.  - Testosterone,Free and Total - Hemoglobin and hematocrit, blood  2. Type 2 diabetes mellitus without complication, without long-term current use of insulin (HCC)  - Hemoglobin A1c  3. Elevated blood pressure reading Counseled on healthy low sodium diet, exercise and weight loss. Advised will need to start medications if not better at follow up.       Argentina Ponder De[place],acting as a scribe for Lelon Huh, MD.,have  documented all relevant documentation on the behalf of Lelon Huh, MD,as directed by  Lelon Huh, MD while in the presence of Lelon Huh, MD.   The entirety of the information documented in the History of Present Illness, Review of Systems and Physical Exam were personally obtained by me. Portions of this information were initially documented by the CMA and reviewed by me for thoroughness and accuracy.     Lelon Huh, MD  [facility] [phone] (phone) [phone] (fax)  [place]

## 2021-10-26 ENCOUNTER — Encounter: Payer: Self-pay | Admitting: Family Medicine

## 2021-10-26 ENCOUNTER — Ambulatory Visit: Payer: BC Managed Care – PPO | Admitting: Family Medicine

## 2021-10-26 ENCOUNTER — Other Ambulatory Visit: Payer: Self-pay

## 2021-10-26 VITALS — BP 162/96 | HR 94 | Temp 98.6°F | Wt 270.0 lb

## 2021-10-26 DIAGNOSIS — E291 Testicular hypofunction: Secondary | ICD-10-CM

## 2021-10-26 DIAGNOSIS — R030 Elevated blood-pressure reading, without diagnosis of hypertension: Secondary | ICD-10-CM

## 2021-10-26 DIAGNOSIS — E119 Type 2 diabetes mellitus without complications: Secondary | ICD-10-CM

## 2021-10-26 NOTE — Patient Instructions (Signed)
.   Please review the attached list of medications and notify my office if there are any errors.   . Please bring all of your medications to every appointment so we can make sure that our medication list is the same as yours.   

## 2021-11-01 LAB — HEMOGLOBIN A1C
Est. average glucose Bld gHb Est-mCnc: 197 mg/dL
Hgb A1c MFr Bld: 8.5 % — ABNORMAL HIGH (ref 4.8–5.6)

## 2021-11-01 LAB — TESTOSTERONE,FREE AND TOTAL
Testosterone, Free: 5.9 pg/mL — ABNORMAL LOW (ref 6.8–21.5)
Testosterone: 229 ng/dL — ABNORMAL LOW (ref 264–916)

## 2021-11-01 LAB — HEMOGLOBIN AND HEMATOCRIT, BLOOD
Hematocrit: 46.4 % (ref 37.5–51.0)
Hemoglobin: 16.2 g/dL (ref 13.0–17.7)

## 2021-11-02 ENCOUNTER — Other Ambulatory Visit: Payer: Self-pay | Admitting: Family Medicine

## 2021-11-02 DIAGNOSIS — E291 Testicular hypofunction: Secondary | ICD-10-CM

## 2021-11-02 MED ORDER — TESTOSTERONE 20.25 MG/ACT (1.62%) TD GEL: 4.0000 | Freq: Every day | TRANSDERMAL | 1 refills | Status: DC

## 2021-11-11 ENCOUNTER — Other Ambulatory Visit: Payer: Self-pay | Admitting: Family Medicine

## 2021-11-11 DIAGNOSIS — E119 Type 2 diabetes mellitus without complications: Secondary | ICD-10-CM

## 2021-11-11 MED ORDER — METFORMIN HCL 500 MG PO TABS: 500.0000 mg | ORAL_TABLET | Freq: Two times a day (BID) | ORAL | 4 refills | Status: DC

## 2021-12-06 ENCOUNTER — Ambulatory Visit: Payer: BC Managed Care – PPO | Admitting: Family Medicine

## 2021-12-06 ENCOUNTER — Other Ambulatory Visit: Payer: Self-pay

## 2021-12-06 ENCOUNTER — Encounter: Payer: Self-pay | Admitting: Family Medicine

## 2021-12-06 DIAGNOSIS — L03115 Cellulitis of right lower limb: Secondary | ICD-10-CM

## 2021-12-06 MED ORDER — DICLOXACILLIN SODIUM 500 MG PO CAPS: 500.0000 mg | ORAL_CAPSULE | Freq: Four times a day (QID) | ORAL | 0 refills | Status: AC

## 2021-12-06 NOTE — Patient Instructions (Signed)
.   Please review the attached list of medications and notify my office if there are any errors.   . Please bring all of your medications to every appointment so we can make sure that our medication list is the same as yours.   

## 2021-12-06 NOTE — Progress Notes (Unsigned)
I,Roshena L Chambers,acting as a scribe for Lelon Huh, MD.,have documented all relevant documentation on the behalf of Lelon Huh, MD,as directed by  Lelon Huh, MD while in the presence of Lelon Huh, MD.    Established patient visit   Patient: Elijah Mora   DOB: [birth year]   51 y.o. Male  MRN: [id number] Visit Date: 12/06/2021  Today's healthcare provider: Lelon Huh, MD   Chief Complaint  Patient presents with   Wound Infection   Subjective    HPI  Leg wound: Patient is here for an evaluation of a wound on the right anterior lower leg. Wound appeared 1 week ago. There has been some yellow drainage. Patient has been applying Neosporin to the wound.  Had two similar episodes in 2001 which both cleared up quickly with dicloxacillin which he tolerated well.  Medications: Outpatient Medications Prior to Visit  Medication Sig   aspirin 81 MG tablet Take 81 mg by mouth daily.   atorvastatin (LIPITOR) 40 MG tablet TAKE 1 TABLET (40 MG TOTAL) BY MOUTH DAILY. PLEASE SCHEDULE AN OFFICE VISIT BEFORE ANYMORE REFILLS.   Blood Glucose Monitoring Suppl (ONE TOUCH ULTRA 2) w/Device KIT Use to check sugar daily for type 2 diabetes E11.9   cetirizine (ZYRTEC) 10 MG tablet Take 10 mg by mouth daily.   cyclobenzaprine (FLEXERIL) 10 MG tablet Take 1 tablet (10 mg total) by mouth 3 (three) times daily as needed.   escitalopram (LEXAPRO) 10 MG tablet TAKE 1 TABLET BY MOUTH EVERY DAY   fluticasone (FLONASE) 50 MCG/ACT nasal spray Place 1 spray into both nostrils daily.   glucose blood (ONETOUCH ULTRA) test strip Use to check sugar daily for type 2 diabetes E11.9   ibuprofen (ADVIL,MOTRIN) 800 MG tablet Take 1 tablet (800 mg total) by mouth every 8 (eight) hours as needed.   Lancets (ONETOUCH ULTRASOFT) lancets Use to check blood sugar daily for type 2 diabetes E11.9   metFORMIN (GLUCOPHAGE) 500 MG tablet Take 1 tablet (500 mg total) by mouth 2 (two) times daily with a meal.    oxyCODONE-acetaminophen (PERCOCET) 5-325 MG tablet Take 1 tablet by mouth every 6 (six) hours as needed.   Testosterone 20.25 MG/ACT (1.62%) GEL Place 4 Pump onto the skin daily.   triamcinolone ointment (KENALOG) 0.5 % Apply 1 application topically 2 (two) times daily.   No facility-administered medications prior to visit.    Review of Systems  Constitutional:  Negative for appetite change, chills and fever.  Respiratory:  Negative for chest tightness, shortness of breath and wheezing.   Cardiovascular:  Negative for chest pain and palpitations.  Gastrointestinal:  Negative for abdominal pain, nausea and vomiting.  Skin:  Positive for color change and wound.   {Labs   Heme   Chem   Endocrine   Serology   Results Review (optional):23779}   Objective    BP (!) 161/103 (BP Location: Right Arm, Patient Position: Sitting, Cuff Size: Large)    Pulse 99    Temp 98.8 F (37.1 C) (Oral)    Resp 16    Wt 266 lb (120.7 kg)    SpO2 100% Comment: room air   BMI 38.17 kg/m  {Show previous vital signs (optional):23777} Today's Vitals   12/06/21 1325 12/06/21 1329  BP: (!) 154/96 (!) 161/103  Pulse: 99   Resp: 16   Temp: 98.8 F (37.1 C)   TempSrc: Oral   SpO2: 100%   Weight: 266 lb (120.7 kg)    Body  mass index is 38.17 kg/m.   Physical Exam       Assessment & Plan     1. Cellulitis of right lower extremity Did well with dicloxacillin in the past.  - dicloxacillin (DYNAPEN) 500 MG capsule; Take 1 capsule (500 mg total) by mouth 4 (four) times daily for 10 days.  Dispense: 40 capsule; Refill: 0   Call if symptoms change or if not rapidly improving.        The entirety of the information documented in the History of Present Illness, Review of Systems and Physical Exam were personally obtained by me. Portions of this information were initially documented by the CMA and reviewed by me for thoroughness and accuracy.     Lelon Huh, MD  [facility] [phone]  (phone) [phone] (fax)  [place]

## 2022-02-23 NOTE — Progress Notes (Deleted)
Established patient visit   Patient: Elijah Mora   DOB: [birth year]   51 y.o. Male  MRN: [id number] Visit Date: 02/25/2022  Today's healthcare provider: Lelon Huh, MD   No chief complaint on file.  Subjective    HPI  Diabetes Mellitus Type II, Follow-up  Lab Results  Component Value Date   HGBA1C 8.5 (H) 10/26/2021   HGBA1C 7.3 (H) 07/30/2021   HGBA1C 7.1 (A) 03/08/2021   Wt Readings from Last 3 Encounters:  12/06/21 266 lb (120.7 kg)  10/26/21 270 lb (122.5 kg)  09/22/21 268 lb (121.6 kg)   Last seen for diabetes 4 months ago.  Management since then includes increase the metformin to take one tablet in the morning and one tablet in the evening. He reports {excellent/good/fair/poor:19665} compliance with treatment. He {is/is not:21021397} having side effects. {document side effects if present:1} Symptoms: {Yes/No:20286} fatigue {Yes/No:20286} foot ulcerations  {Yes/No:20286} appetite changes {Yes/No:20286} nausea  {Yes/No:20286} paresthesia of the feet  {Yes/No:20286} polydipsia  {Yes/No:20286} polyuria {Yes/No:20286} visual disturbances   {Yes/No:20286} vomiting     Home blood sugar records: {diabetes glucometry results:16657}  Episodes of hypoglycemia? {Yes/No:20286} {enter symptoms and frequency of symptoms if yes:1}   Current insulin regiment: {enter 'none' or type of insulin and number of units taken with each dose of each insulin formulation that the patient is taking:1} Most Recent Eye Exam: *** {Current exercise:16438:::1} {Current diet habits:16563:::1}  Pertinent Labs: Lab Results  Component Value Date   CHOL 105 07/30/2021   HDL 29 (L) 07/30/2021   LDLCALC 44 07/30/2021   TRIG 198 (H) 07/30/2021   CHOLHDL 3.6 07/30/2021   Lab Results  Component Value Date   NA 142 07/30/2021   K 4.1 07/30/2021   CREATININE 1.08 07/30/2021   EGFR 85 07/30/2021   MICROALBUR 50 03/08/2021   LABMICR See below: 02/17/2017      ---------------------------------------------------------------------------------------------------  Follow up for Testosterone Level  The patient was last seen for this 4 months ago. Changes made at last visit include recommended to increase the testosterone to 4 gel pumps every day.Marland Kitchen  He reports {excellent/good/fair/poor:19665} compliance with treatment. He feels that condition is {improved/worse/unchanged:3041574}. He {is/is not:21021397} having side effects. ***  -----------------------------------------------------------------------------------------  Medications: Outpatient Medications Prior to Visit  Medication Sig   aspirin 81 MG tablet Take 81 mg by mouth daily.   atorvastatin (LIPITOR) 40 MG tablet TAKE 1 TABLET (40 MG TOTAL) BY MOUTH DAILY. PLEASE SCHEDULE AN OFFICE VISIT BEFORE ANYMORE REFILLS.   Blood Glucose Monitoring Suppl (ONE TOUCH ULTRA 2) w/Device KIT Use to check sugar daily for type 2 diabetes E11.9   cetirizine (ZYRTEC) 10 MG tablet Take 10 mg by mouth daily.   cyclobenzaprine (FLEXERIL) 10 MG tablet Take 1 tablet (10 mg total) by mouth 3 (three) times daily as needed.   escitalopram (LEXAPRO) 10 MG tablet TAKE 1 TABLET BY MOUTH EVERY DAY   fluticasone (FLONASE) 50 MCG/ACT nasal spray Place 1 spray into both nostrils daily.   glucose blood (ONETOUCH ULTRA) test strip Use to check sugar daily for type 2 diabetes E11.9   ibuprofen (ADVIL,MOTRIN) 800 MG tablet Take 1 tablet (800 mg total) by mouth every 8 (eight) hours as needed.   Lancets (ONETOUCH ULTRASOFT) lancets Use to check blood sugar daily for type 2 diabetes E11.9   metFORMIN (GLUCOPHAGE) 500 MG tablet Take 1 tablet (500 mg total) by mouth 2 (two) times daily with a meal.   oxyCODONE-acetaminophen (PERCOCET) 5-325 MG tablet Take 1  tablet by mouth every 6 (six) hours as needed.   Testosterone 20.25 MG/ACT (1.62%) GEL Place 4 Pump onto the skin daily.   triamcinolone ointment (KENALOG) 0.5 % Apply 1  application topically 2 (two) times daily.   No facility-administered medications prior to visit.    Review of Systems  {Labs  Heme  Chem  Endocrine  Serology  Results Review (optional):23779}   Objective    There were no vitals taken for this visit. {Show previous vital signs (optional):23777}  Physical Exam  ***  No results found for any visits on 02/25/22.  Assessment & Plan     ***  No follow-ups on file.      {provider attestation***:1}   Lelon Huh, MD  [facility] [phone] (phone) [phone] (fax)  [place]

## 2022-02-25 ENCOUNTER — Ambulatory Visit: Payer: BC Managed Care – PPO | Admitting: Family Medicine

## 2022-02-28 ENCOUNTER — Ambulatory Visit (INDEPENDENT_AMBULATORY_CARE_PROVIDER_SITE_OTHER): Payer: BC Managed Care – PPO | Admitting: Family Medicine

## 2022-02-28 ENCOUNTER — Encounter: Payer: Self-pay | Admitting: Family Medicine

## 2022-02-28 VITALS — BP 161/101 | HR 90 | Temp 98.7°F | Resp 16 | Wt 271.9 lb

## 2022-02-28 DIAGNOSIS — E119 Type 2 diabetes mellitus without complications: Secondary | ICD-10-CM

## 2022-02-28 DIAGNOSIS — E291 Testicular hypofunction: Secondary | ICD-10-CM

## 2022-02-28 DIAGNOSIS — I10 Essential (primary) hypertension: Secondary | ICD-10-CM

## 2022-02-28 LAB — POCT GLYCOSYLATED HEMOGLOBIN (HGB A1C)
Est. average glucose Bld gHb Est-mCnc: 177
Hemoglobin A1C: 7.8 % — AB (ref 4.0–5.6)

## 2022-02-28 MED ORDER — VALSARTAN-HYDROCHLOROTHIAZIDE 80-12.5 MG PO TABS: 1.0000 | ORAL_TABLET | Freq: Every day | ORAL | 1 refills | Status: DC

## 2022-02-28 NOTE — Progress Notes (Signed)
I,Jana Robinson,acting as a scribe for Lelon Huh, MD.,have documented all relevant documentation on the behalf of Lelon Huh, MD,as directed by  Lelon Huh, MD while in the presence of Lelon Huh, MD.   Established patient visit   Patient: Elijah Mora   DOB: 01/24/1971   51 y.o. Male  MRN: [id number] Visit Date: 02/28/2022  Today's healthcare provider: Lelon Huh, MD   Chief Complaint  Patient presents with   Diabetes   Subjective    HPI HPI   Elevated BP  Hypogonadism  Last edited by Alanson Puls, CMA on 02/28/2022  8:20 AM.      Diabetes Mellitus Type II, Follow-up  Lab Results  Component Value Date   HGBA1C 7.8 (A) 02/28/2022   HGBA1C 8.5 (H) 10/26/2021   HGBA1C 7.3 (H) 07/30/2021   Wt Readings from Last 3 Encounters:  02/28/22 271 lb 14.4 oz (123.3 kg)  12/06/21 266 lb (120.7 kg)  10/26/21 270 lb (122.5 kg)   Last seen for diabetes 4 months ago.  Management since then includes increasing metformin to take one tablet in the morning and one tablet in the evening.  He reports excellent compliance with treatment. He is not having side effects. Nausea in the a.m. before eating Not bothersome per patient. Symptoms: Yes fatigue No foot ulcerations  No appetite changes No nausea  No paresthesia of the feet  No polydipsia  No polyuria No visual disturbances   No vomiting     Home blood sugar records:  not checking   Episodes of hypoglycemia? No    Current insulin regiment: none Most Recent Eye Exam: 04-12-21 Current exercise: walks daily, gym twice weekly  Current diet habits: in general, a "healthy" diet    Pertinent Labs: Lab Results  Component Value Date   CHOL 105 07/30/2021   HDL 29 (L) 07/30/2021   LDLCALC 44 07/30/2021   TRIG 198 (H) 07/30/2021   CHOLHDL 3.6 07/30/2021   Lab Results  Component Value Date   NA 142 07/30/2021   K 4.1 07/30/2021   CREATININE 1.08 07/30/2021   EGFR 85 07/30/2021   MICROALBUR 50  03/08/2021   LABMICR See below: 02/17/2017     ---------------------------------------------------------------------------------------------------   Follow up for elevated blood pressure:  The patient was last seen for this 4 months ago. Changes made at last visit include none. Counseled on healthy low sodium diet, exercise and weight loss. Advised will need to start medications if not better at follow up.  -----------------------------------------------------------------------------------------  Follow up for hypogonadism:  The patient was last seen for this 4 months ago. Changes made at last visit include recommend increasing testosterone to 4 gel pumps every day.  He reports broke out in bumps on shoulders.  Stopped medication.    Medications: Outpatient Medications Prior to Visit  Medication Sig   aspirin 81 MG tablet Take 81 mg by mouth daily.   atorvastatin (LIPITOR) 40 MG tablet TAKE 1 TABLET (40 MG TOTAL) BY MOUTH DAILY. PLEASE SCHEDULE AN OFFICE VISIT BEFORE ANYMORE REFILLS.   Blood Glucose Monitoring Suppl (ONE TOUCH ULTRA 2) w/Device KIT Use to check sugar daily for type 2 diabetes E11.9   cetirizine (ZYRTEC) 10 MG tablet Take 10 mg by mouth daily.   cyclobenzaprine (FLEXERIL) 10 MG tablet Take 1 tablet (10 mg total) by mouth 3 (three) times daily as needed.   escitalopram (LEXAPRO) 10 MG tablet TAKE 1 TABLET BY MOUTH EVERY DAY   fluticasone (FLONASE) 50 MCG/ACT nasal  spray Place 1 spray into both nostrils daily.   glucose blood (ONETOUCH ULTRA) test strip Use to check sugar daily for type 2 diabetes E11.9   ibuprofen (ADVIL,MOTRIN) 800 MG tablet Take 1 tablet (800 mg total) by mouth every 8 (eight) hours as needed.   Lancets (ONETOUCH ULTRASOFT) lancets Use to check blood sugar daily for type 2 diabetes E11.9   metFORMIN (GLUCOPHAGE) 500 MG tablet Take 1 tablet (500 mg total) by mouth 2 (two) times daily with a meal.   triamcinolone ointment (KENALOG) 0.5 % Apply 1  application topically 2 (two) times daily.   oxyCODONE-acetaminophen (PERCOCET) 5-325 MG tablet Take 1 tablet by mouth every 6 (six) hours as needed. (Patient not taking: Reported on 02/28/2022)   Testosterone 20.25 MG/ACT (1.62%) GEL Place 4 Pump onto the skin daily. (Not taking. Reported on 02/28/2022)   No facility-administered medications prior to visit.    Review of Systems     Objective    BP (!) 161/101 (BP Location: Left Arm, Patient Position: Sitting, Cuff Size: Normal)   Pulse 90   Temp 98.7 F (37.1 C) (Oral)   Resp 16   Wt 271 lb 14.4 oz (123.3 kg)   SpO2 99%   BMI 39.01 kg/m    Physical Exam   General appearance: Mildly obese male, cooperative and in no acute distress Head: Normocephalic, without obvious abnormality, atraumatic Respiratory: Respirations even and unlabored, normal respiratory rate Extremities: All extremities are intact.  Skin: Skin color, texture, turgor normal. No rashes seen  Psych: Appropriate mood and affect. Neurologic: Mental status: Alert, oriented to person, place, and time, thought content appropriate.   Results for orders placed or performed in visit on 02/28/22  POCT HgB A1C  Result Value Ref Range   Hemoglobin A1C 7.8 (A) 4.0 - 5.6 %   Est. average glucose Bld gHb Est-mCnc 177     Assessment & Plan     1. Type 2 diabetes mellitus without complication, without long-term current use of insulin (HCC) A1c improving with dietary improvements, although not at goal. Discussed increased metformin, although he does feel slightly nauseated in after taking it in the morning. For now, will continue working on diet.    - Urine microalbumin-creatinine with uACR  2. Primary hypertension Start valsartan-hydrochlorothiazide (DIOVAN-HCT) 80-12.5 MG tablet; Take 1 tablet by mouth daily.  Dispense: 30 tablet; Refill: 1 Follow to check Bp and renal panel in 3-4 weeks.   3. Hypogonadism in male He stopped taking topical testosterone because he had  rash and some itching when he went up on dose, which clear up when he stopped using it. He can try starting back and just 1 gel pump a day and see if rash returns.        The entirety of the information documented in the History of Present Illness, Review of Systems and Physical Exam were personally obtained by me. Portions of this information were initially documented by the CMA and reviewed by me for thoroughness and accuracy.     Lelon Huh, MD  [facility] [phone] (phone) [phone] (fax)  [place]

## 2022-03-01 LAB — MICROALBUMIN / CREATININE URINE RATIO
Creatinine, Urine: 154 mg/dL
Microalb/Creat Ratio: 9 mg/g creat (ref 0–29)
Microalbumin, Urine: 13.7 ug/mL

## 2022-03-23 ENCOUNTER — Other Ambulatory Visit: Payer: Self-pay | Admitting: Family Medicine

## 2022-03-23 DIAGNOSIS — I10 Essential (primary) hypertension: Secondary | ICD-10-CM

## 2022-03-23 NOTE — Telephone Encounter (Signed)
Rx 02/28/22 #30 1RF - too soon Requested Prescriptions  Pending Prescriptions Disp Refills  . valsartan-hydrochlorothiazide (DIOVAN-HCT) 80-12.5 MG tablet [Pharmacy Med Name: VALSARTAN-HCTZ 80-12.5 MG TAB] 30 tablet 1    Sig: TAKE 1 TABLET BY MOUTH EVERY DAY     Cardiovascular: ARB + Diuretic Combos Failed - 03/23/2022  2:35 PM      Failed - K in normal range and within 180 days    Potassium  Date Value Ref Range Status  07/30/2021 4.1 3.5 - 5.2 mmol/L Final  08/05/2013 4.1 3.5 - 5.1 mmol/L Final         Failed - Na in normal range and within 180 days    Sodium  Date Value Ref Range Status  07/30/2021 142 134 - 144 mmol/L Final  08/05/2013 142 136 - 145 mmol/L Final         Failed - Cr in normal range and within 180 days    Creatinine  Date Value Ref Range Status  08/05/2013 1.11 0.60 - 1.30 mg/dL Final   Creatinine, Ser  Date Value Ref Range Status  07/30/2021 1.08 0.76 - 1.27 mg/dL Final         Failed - eGFR is 10 or above and within 180 days    EGFR (African American)  Date Value Ref Range Status  08/05/2013 >60  Final   GFR calc Af Amer  Date Value Ref Range Status  05/29/2020 112 >59 mL/min/1.73 Final    Comment:    **[facility] currently reports eGFR in compliance with the current**   recommendations of the [employer]. [facility] will   update reporting as new guidelines are published from the NKF-ASN   Task force.    EGFR (Non-African Amer.)  Date Value Ref Range Status  08/05/2013 >60  Final    Comment:    eGFR values <15m/min/1.73 m2 may be an indication of chronic kidney disease (CKD). Calculated eGFR is useful in patients with stable renal function. The eGFR calculation will not be reliable in acutely ill patients when serum creatinine is changing rapidly. It is not useful in  patients on dialysis. The eGFR calculation may not be applicable to patients at the low and high extremes of body sizes, pregnant women, and vegetarians.    GFR  calc non Af Amer  Date Value Ref Range Status  05/29/2020 97 >59 mL/min/1.73 Final   eGFR  Date Value Ref Range Status  07/30/2021 85 >59 mL/min/1.73 Final         Failed - Last BP in normal range    BP Readings from Last 1 Encounters:  02/28/22 (!) 161/101         Passed - Patient is not pregnant      Passed - Valid encounter within last 6 months    Recent Outpatient Visits          3 weeks ago Type 2 diabetes mellitus without complication, without long-term current use of insulin [facility]   3 months ago Cellulitis of right lower extremity   [facility]   4 months ago Hypogonadism in [place]   [place]   6 months ago Muscle strain   [place]   7 months ago [facility] physical [facility]      Future Appointments  In 1 week Fisher, Kirstie Peri, MD [facility] [place]

## 2022-04-04 ENCOUNTER — Ambulatory Visit: Payer: BC Managed Care – PPO | Admitting: Family Medicine

## 2022-04-04 NOTE — Progress Notes (Deleted)
Established patient visit   Patient: Elijah Mora   DOB: Sep 24, 1971   51 y.o. Male  MRN: [id number] Visit Date: 04/04/2022  Today's healthcare provider: Lelon Huh, MD   No chief complaint on file.  Subjective    HPI  Hypertension, follow-up  BP Readings from Last 3 Encounters:  02/28/22 (!) 161/101  12/06/21 (!) 161/103  10/26/21 (!) 162/96   Wt Readings from Last 3 Encounters:  02/28/22 271 lb 14.4 oz (123.3 kg)  12/06/21 266 lb (120.7 kg)  10/26/21 270 lb (122.5 kg)     He was last seen for hypertension 1  month  ago.  BP at that visit was 161/101. Management since that visit includes starting valsartan-hydrochlorothiazide (DIOVAN-HCT) 80-12.5 MG tablet; Take 1 tablet by mouth daily.    He reports {excellent/good/fair/poor:19665} compliance with treatment. He {is/is not:9024} having side effects. {document side effects if present:1} He is following a {diet:21022986} diet. He {is/is not:9024} exercising. He {does/does not:200015} smoke.  Use of agents associated with hypertension: NSAIDS.   Outside blood pressures are {***enter patient reported home BP readings, or 'not being checked':1}. Symptoms: {Yes/No:20286} chest pain {Yes/No:20286} chest pressure  {Yes/No:20286} palpitations {Yes/No:20286} syncope  {Yes/No:20286} dyspnea {Yes/No:20286} orthopnea  {Yes/No:20286} paroxysmal nocturnal dyspnea {Yes/No:20286} lower extremity edema   Pertinent labs Lab Results  Component Value Date   CHOL 105 07/30/2021   HDL 29 (L) 07/30/2021   LDLCALC 44 07/30/2021   TRIG 198 (H) 07/30/2021   CHOLHDL 3.6 07/30/2021   Lab Results  Component Value Date   NA 142 07/30/2021   K 4.1 07/30/2021   CREATININE 1.08 07/30/2021   EGFR 85 07/30/2021   GLUCOSE 138 (H) 07/30/2021   TSH 2.130 07/30/2021     The ASCVD Risk score (Arnett DK, et al., 2019) failed to calculate for the following reasons:   The valid total cholesterol range is 130 to 320  mg/dL  ---------------------------------------------------------------------------------------------------   Medications: Outpatient Medications Prior to Visit  Medication Sig   aspirin 81 MG tablet Take 81 mg by mouth daily.   atorvastatin (LIPITOR) 40 MG tablet TAKE 1 TABLET (40 MG TOTAL) BY MOUTH DAILY. PLEASE SCHEDULE AN OFFICE VISIT BEFORE ANYMORE REFILLS.   Blood Glucose Monitoring Suppl (ONE TOUCH ULTRA 2) w/Device KIT Use to check sugar daily for type 2 diabetes E11.9   cetirizine (ZYRTEC) 10 MG tablet Take 10 mg by mouth daily.   cyclobenzaprine (FLEXERIL) 10 MG tablet Take 1 tablet (10 mg total) by mouth 3 (three) times daily as needed.   escitalopram (LEXAPRO) 10 MG tablet TAKE 1 TABLET BY MOUTH EVERY DAY   fluticasone (FLONASE) 50 MCG/ACT nasal spray Place 1 spray into both nostrils daily.   glucose blood (ONETOUCH ULTRA) test strip Use to check sugar daily for type 2 diabetes E11.9   ibuprofen (ADVIL,MOTRIN) 800 MG tablet Take 1 tablet (800 mg total) by mouth every 8 (eight) hours as needed.   Lancets (ONETOUCH ULTRASOFT) lancets Use to check blood sugar daily for type 2 diabetes E11.9   metFORMIN (GLUCOPHAGE) 500 MG tablet Take 1 tablet (500 mg total) by mouth 2 (two) times daily with a meal.   oxyCODONE-acetaminophen (PERCOCET) 5-325 MG tablet Take 1 tablet by mouth every 6 (six) hours as needed. (Patient not taking: Reported on 02/28/2022)   Testosterone 20.25 MG/ACT (1.62%) GEL Place 4 Pump onto the skin daily. (Patient not taking: Reported on 02/28/2022)   triamcinolone ointment (KENALOG) 0.5 % Apply 1 application topically 2 (two) times  daily.   valsartan-hydrochlorothiazide (DIOVAN-HCT) 80-12.5 MG tablet Take 1 tablet by mouth daily.   No facility-administered medications prior to visit.    Review of Systems  {Labs  Heme  Chem  Endocrine  Serology  Results Review (optional):23779}   Objective    There were no vitals taken for this visit. {Show previous vital signs  (optional):23777}  Physical Exam  ***  No results found for any visits on 04/04/22.  Assessment & Plan     ***  No follow-ups on file.      {provider attestation***:1}   Lelon Huh, MD  [facility] [phone] (phone) [phone] (fax)  [place]

## 2022-04-22 ENCOUNTER — Ambulatory Visit: Payer: BC Managed Care – PPO | Admitting: Family Medicine

## 2022-04-26 ENCOUNTER — Other Ambulatory Visit: Payer: Self-pay | Admitting: Family Medicine

## 2022-04-26 DIAGNOSIS — I10 Essential (primary) hypertension: Secondary | ICD-10-CM

## 2022-05-04 ENCOUNTER — Ambulatory Visit: Payer: Self-pay

## 2022-05-04 NOTE — Telephone Encounter (Signed)
  Chief Complaint: Back - Sciatica pain Symptoms: Pain on right side from hip down. Tingling in rt foot Frequency: 4-5 weeks Pertinent Negatives: Patient denies Loss of bowel or bladder control Disposition: '[]'$ ED /'[]'$ Urgent Care (no appt availability in office) / '[]'$ Appointment(In office/virtual)/ '[]'$  [place] [facility]/ '[]'$ Home Care/ '[x]'$ Refused Recommended Disposition /'[]'$  Mobile Bus/ '[]'$  Follow-up with PCP Additional Notes: Pt has had this sciatica pain in the past. He is wanting for Dr. Caryn Section to call in a prescription for prednisone. Pt is willing to be seen if Dr. Caryn Section is unable.    Summary: Pt request Rx for prednsone for sciatic pain   Pt experiencing sciatic pain and requests that Dr. Caryn Section [facility] in [id number] [phone] Fax: [phone]     Reason for Disposition  [1] MODERATE back pain (e.g., interferes with normal activities) AND [2] present > 3 days  Answer Assessment - Initial Assessment Questions 1. ONSET: "When did the pain begin?"      4-5 weeks 2. LOCATION: "Where does it hurt?" (upper, mid or lower back)     Right side - right hip down the back of leg and tingling in right foot 3. SEVERITY: "How bad is the pain?"  (e.g., Scale 1-10; mild, moderate, or severe)   - MILD (1-3): Doesn't interfere with normal activities.    - MODERATE (4-7): Interferes with normal activities or awakens from sleep.    - SEVERE (8-10): Excruciating pain, unable to do any normal activities.      5-6-7/10 4. PATTERN: "Is the pain constant?" (e.g., yes, no; constant, intermittent)      yes 5. RADIATION: "Does the pain shoot into your legs or somewhere else?"     Yes 6. CAUSE:  "What do you think is causing the back pain?"      Sciatica 7. BACK OVERUSE:  "Any recent lifting of heavy objects, strenuous work or exercise?"     Rode 6.5 hours in a car 8. MEDICINES: "What have you taken so far for the  pain?" (e.g., nothing, acetaminophen, NSAIDS)     800 IBU 9. NEUROLOGIC SYMPTOMS: "Do you have any weakness, numbness, or problems with bowel/bladder control?"     No - tingling on bottom of foot 10. OTHER SYMPTOMS: "Do you have any other symptoms?" (e.g., fever, abdomen pain, burning with urination, blood in urine)       no 11. PREGNANCY: "Is there any chance you are pregnant?" "When was your last menstrual period?"       na  Protocols used: Back Pain-A-AH

## 2022-05-05 MED ORDER — PREDNISONE 10 MG (21) PO TBPK
ORAL_TABLET | ORAL | 0 refills | Status: DC
Start: 2022-05-05 — End: 2022-05-10

## 2022-05-05 NOTE — Telephone Encounter (Signed)
Prednisone taper sent to [facility]. Call if not  quickly improving.

## 2022-05-05 NOTE — Addendum Note (Signed)
Addended by: Birdie Sons on: 05/05/2022 09:04 AM   Modules accepted: Orders

## 2022-05-06 NOTE — Telephone Encounter (Signed)
Patient was advised.  

## 2022-05-10 ENCOUNTER — Encounter: Payer: Self-pay | Admitting: Family Medicine

## 2022-05-10 ENCOUNTER — Ambulatory Visit: Payer: BC Managed Care – PPO | Admitting: Family Medicine

## 2022-05-10 ENCOUNTER — Ambulatory Visit: Payer: Self-pay | Admitting: *Deleted

## 2022-05-10 VITALS — BP 156/90 | HR 81 | Temp 98.0°F | Resp 16 | Wt 260.0 lb

## 2022-05-10 DIAGNOSIS — M5416 Radiculopathy, lumbar region: Secondary | ICD-10-CM | POA: Diagnosis not present

## 2022-05-10 MED ORDER — PREDNISONE 50 MG PO TABS: 50.0000 mg | ORAL_TABLET | Freq: Every day | ORAL | 0 refills | Status: AC

## 2022-05-10 NOTE — Patient Instructions (Signed)
?  Lie on your back. Bend your right knee so that your right foot is flat on the floor. ?Cross your left leg over your right so that your left ankle rests on your right knee. ?Use your hands to grab hold of your left knee and pull it gently toward the opposite shoulder. You should feel the stretch in your buttocks and hips. ?Hold for 15 to 30 seconds. ?Relax, and then repeat with the other leg. ?Repeat this cycle 2 to 4 times. ? ? ?Lie on your back in a doorway, with one leg through the open door. ?Slide your leg up the wall to straighten your knee. You should feel a gentle stretch down the back of your leg. Hold the stretch for at least 1 minute. As the days go by, add a little more time until you can relax and let these muscles stretch for as much as 6 minutes for each leg. ?Do not arch your back. ?Do not bend either knee. ?Keep one heel touching the floor and the other heel touching the wall. Do not point your toes. ?Repeat with your other leg. ?Do 2 to 4 times for each leg. ?If you do not have a place to do this exercise in a doorway, there is another way to do it: ? ?Lie on your back and bend the knee of the leg you want to stretch. ?Loop a towel under the ball and toes of that foot, and hold the ends of the towel in your hands. ?Straighten your knee and slowly pull back on the towel. You should feel a gentle stretch down the back of your leg. It is hard to hold this stretch with a towel for a long time, but hold the stretch for at least 15 to 30 seconds. One minute or more is even better. ?Repeat with your other leg. ?Do 2 to 4 times for each leg. ? ? ?Child's Pose ?Kneel on the floor with your toes together and your knees hip-width apart. Rest your palms on top of your thighs. ?On an exhale, lower your torso between your knees. Extend your arms alongside your torso with your palms facing down. Relax your shoulders toward the ground. Rest in the pose for as long as needed. ? ?

## 2022-05-10 NOTE — Progress Notes (Signed)
   SUBJECTIVE:   CHIEF COMPLAINT / HPI:   BACK PAIN - XR 12/2015 with mild disc flattening at L4-5 and L5-S1. 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 Has been taking '800mg'$  ibuprofen every 8h since flare started with no relief. Finishes prednisone taper today. Had relief with first few days, none since.  - worse with sitting.  Duration: 1 month Mechanism of injury: no trauma, prolonged sitting with car ride Location: R low back  Quality: sharp Frequency: constant Radiation: R leg below the knee Aggravating factors: prolonged sitting Alleviating factors: standing Status: worse Treatments attempted: rest, ice, and ibuprofen  Relief with NSAIDs?: No NSAIDs Taken Nighttime pain:  no Paresthesias / decreased sensation:   numbness, nothing new Bowel / bladder incontinence:  no Fevers:  no Dysuria / urinary frequency:  no   OBJECTIVE:   BP (!) 156/90 (BP Location: Left Arm, Patient Position: Sitting, Cuff Size: Large)   Pulse 81   Temp 98 F (36.7 C) (Oral)   Resp 16   Wt 260 lb (117.9 kg)   BMI 37.31 kg/m   Gen: well appearing, in NAD MSK: no midline spinal tenderness. Full AROM in flexion, extension, sidebending. TTP over R sided lumbar paravertebral musculature and piriformis. No tenderness over greater trochanter. No LE edema.     ASSESSMENT/PLAN:   Lumbar radiculopathy Chronic. Recently exacerbated with long car ride, refractory to NSAIDs, steroid taper. Will trial steroid burst. Referred for PT. Recommend weight loss. Return and emergency precautions discussed.     Myles Gip, DO

## 2022-05-10 NOTE — Telephone Encounter (Signed)
Needs office visit or go to urgent care 

## 2022-05-10 NOTE — Telephone Encounter (Signed)
Patient advised. Appointment scheduled today with Dr. Ky Barban.

## 2022-05-10 NOTE — Assessment & Plan Note (Addendum)
Chronic. Recently exacerbated with long car ride, refractory to NSAIDs, steroid taper. Will trial steroid burst. Referred for PT. Recommend weight loss. Return and emergency precautions discussed.

## 2022-05-10 NOTE — Telephone Encounter (Signed)
  Chief Complaint: Back Pain Symptoms: Worsening sciatica Frequency: Little better Sat, now back "A little worse."Right leg weakness when pressure on heel, nausea, sweating. Pertinent Negatives: Patient denies bowel, bladder issues Disposition: '[]'$ ED /'[]'$ Urgent Care (no appt availability in office) / '[]'$ Appointment(In office/virtual)/ '[]'$  [place] [facility]/ '[]'$ Home Care/ '[]'$ Refused Recommended Disposition /'[]'$ [place] [facility]/ '[x]'$  Follow-up with PCP Additional Notes: Triaged 8/9 with message from Dr. Caryn Section 8/10 to CB if no improvement.  Pt completed prednisone taper this AM. Pt states now pain right leg from knee into calf, with constant pain buttocks to thigh (all right sided.) Assured pt NT would route to practice for PCPs review and final disposition. Please advise. Reason for Disposition  [1] Pain radiates into the thigh or further down the leg AND [2] one leg  Answer Assessment - Initial Assessment Questions 1. ONSET: "When did the pain begin?"      *No Answer* 2. LOCATION: "Where does it hurt?" (upper, mid or lower back)     *No Answer* 3. SEVERITY: "How bad is the pain?"  (e.g., Scale 1-10; mild, moderate, or severe)   - MILD (1-3): Doesn't interfere with normal activities.    - MODERATE (4-7): Interferes with normal activities or awakens from sleep.    - SEVERE (8-10): Excruciating pain, unable to do any normal activities.      6-7/10 4. PATTERN: "Is the pain constant?" (e.g., yes, no; constant, intermittent)      Constant buttock to thigh 5. RADIATION: "Does the pain shoot into your legs or somewhere else?"     Into calf from knee, right leg 6. CAUSE:  "What do you think is causing the back pain?"      Sciatica. 7. BACK OVERUSE:  "Any recent lifting of heavy objects, strenuous work or exercise?"      8. MEDICINES: "What have you taken so far for the pain?" (e.g., nothing, acetaminophen, NSAIDS)      9. NEUROLOGIC SYMPTOMS: "Do you have any weakness, numbness, or problems  with bowel/bladder control?"      10. OTHER SYMPTOMS: "Do you have any other symptoms?" (e.g., fever, abdomen pain, burning with urination, blood in urine)       Nausea, sweating. Weight on heel, weakness of right leeg.  Protocols used: Back Pain-A-AH

## 2022-05-17 ENCOUNTER — Ambulatory Visit
Admission: RE | Admit: 2022-05-17 | Discharge: 2022-05-17 | Disposition: A | Discharge status: Home / Self Care | Payer: BC Managed Care – PPO | Attending: Family Medicine | Admitting: Family Medicine

## 2022-05-17 ENCOUNTER — Ambulatory Visit
Admission: RE | Admit: 2022-05-17 | Discharge: 2022-05-17 | Disposition: A | Discharge status: Home / Self Care | Payer: BC Managed Care – PPO | Source: Ambulatory Visit | Attending: Family Medicine | Admitting: Family Medicine

## 2022-05-17 ENCOUNTER — Ambulatory Visit: Payer: Self-pay

## 2022-05-17 DIAGNOSIS — M545 Low back pain, unspecified: Secondary | ICD-10-CM | POA: Diagnosis not present

## 2022-05-17 DIAGNOSIS — M5416 Radiculopathy, lumbar region: Secondary | ICD-10-CM | POA: Diagnosis not present

## 2022-05-17 MED ORDER — CYCLOBENZAPRINE HCL 5 MG PO TABS: ORAL_TABLET | ORAL | 0 refills | Status: DC

## 2022-05-17 NOTE — Telephone Encounter (Signed)
Right side back pain radiating down buttocks. Completed 5 days of prednisone and symptom is not improving. Patient states he's gait is off due to pain. Pain is at a 5 pain level and sweaty because of the pain. No fever.     Chief Complaint: Continued low back pain. Seen in office 05/10/22. Completed Prednisone. Constant pain and is affecting his gait. Symptoms: Above Frequency: Several weeks Pertinent Negatives: Patient denies  Disposition: '[]'$ ED /'[]'$ Urgent Care (no appt availability in office) / '[]'$ Appointment(In office/virtual)/ '[]'$  [facility]/ '[]'$ Home Care/ '[]'$ Refused Recommended Disposition /'[]'$ [facility]/ '[x]'$  Follow-up with PCP Additional Notes: Please advise pt.  Answer Assessment - Initial Assessment Questions 1. ONSET: "When did the pain begin?"      Several weeks 2. LOCATION: "Where does it hurt?" (upper, mid or lower back)     Right lower back and leg 3. SEVERITY: "How bad is the pain?"  (e.g., Scale 1-10; mild, moderate, or severe)   - MILD (1-3): Doesn't interfere with normal activities.    - MODERATE (4-7): Interferes with normal activities or awakens from sleep.    - SEVERE (8-10): Excruciating pain, unable to do any normal activities.      5 4. PATTERN: "Is the pain constant?" (e.g., yes, no; constant, intermittent)      Constant 5. RADIATION: "Does the pain shoot into your legs or somewhere else?"     Yes 6. CAUSE:  "What do you think is causing the back pain?"      Unsure 7. BACK OVERUSE:  "Any recent lifting of heavy objects, strenuous work or exercise?"     No 8. MEDICINES: "What have you taken so far for the pain?" (e.g., nothing, acetaminophen, NSAIDS)     Prednisone 9. NEUROLOGIC SYMPTOMS: "Do you have any weakness, numbness, or problems with bowel/bladder control?"     No 10. OTHER SYMPTOMS: "Do you have any other symptoms?" (e.g., fever, abdomen pain, burning with urination, blood in urine)       No 11. PREGNANCY: "Is there any chance you  are pregnant?" "When was your last menstrual period?"       N/a  Protocols used: Back Pain-A-AH

## 2022-05-17 NOTE — Telephone Encounter (Addendum)
Patient advised by CMA Suli. Order for x ray placed and prescription sent into pharmacy.

## 2022-05-17 NOTE — Telephone Encounter (Signed)
He needs to get Xray of lumbar spine. Please place order. Can also send in prescription for cyclobenzaprine '5mg'$  1-2 tabs every eight hours as needed, #30.

## 2022-05-19 MED ORDER — HYDROCODONE-ACETAMINOPHEN 7.5-325 MG PO TABS: 1.0000 | ORAL_TABLET | Freq: Four times a day (QID) | ORAL | 0 refills | Status: AC | PRN

## 2022-05-19 NOTE — Addendum Note (Signed)
Addended by: Birdie Sons on: 05/19/2022 03:59 PM   Modules accepted: Orders

## 2022-05-25 ENCOUNTER — Other Ambulatory Visit: Payer: Self-pay | Admitting: Family Medicine

## 2022-05-25 DIAGNOSIS — I10 Essential (primary) hypertension: Secondary | ICD-10-CM

## 2022-06-16 ENCOUNTER — Other Ambulatory Visit: Payer: Self-pay | Admitting: Family Medicine

## 2022-06-16 DIAGNOSIS — I10 Essential (primary) hypertension: Secondary | ICD-10-CM

## 2022-06-19 ENCOUNTER — Other Ambulatory Visit: Payer: Self-pay | Admitting: Family Medicine

## 2022-06-19 DIAGNOSIS — M5416 Radiculopathy, lumbar region: Secondary | ICD-10-CM

## 2022-06-20 ENCOUNTER — Encounter: Payer: Self-pay | Admitting: Family Medicine

## 2022-06-20 ENCOUNTER — Ambulatory Visit: Payer: BC Managed Care – PPO | Admitting: Family Medicine

## 2022-06-20 VITALS — BP 140/89 | HR 101 | Resp 16 | Ht 70.0 in | Wt 253.0 lb

## 2022-06-20 DIAGNOSIS — L03115 Cellulitis of right lower limb: Secondary | ICD-10-CM | POA: Diagnosis not present

## 2022-06-20 DIAGNOSIS — I872 Venous insufficiency (chronic) (peripheral): Secondary | ICD-10-CM

## 2022-06-20 MED ORDER — CEPHALEXIN 500 MG PO CAPS: 500.0000 mg | ORAL_CAPSULE | Freq: Four times a day (QID) | ORAL | 0 refills | Status: AC

## 2022-06-20 MED ORDER — TRIAMCINOLONE ACETONIDE 0.5 % EX OINT: 1.0000 | TOPICAL_OINTMENT | Freq: Two times a day (BID) | CUTANEOUS | 3 refills | Status: DC

## 2022-06-20 NOTE — Progress Notes (Signed)
    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 - with few ulcerations - itches, not much pain - no more than usual swelling, redness - no fevers - has been putting petroleum jelly on it   OBJECTIVE:   BP (!) 140/89 (BP Location: Left Arm, Patient Position: Sitting, Cuff Size: Large)   Pulse (!) 101   Resp 16   Ht 5' 10"  (1.778 m)   Wt 253 lb (114.8 kg)   SpO2 97%   BMI 36.30 kg/m   Gen: well appearing, in NAD Skin: stasis dermatitis with ulcerations to medial RLL. Surrounding erythema. No bullae or weeping present.    ASSESSMENT/PLAN:   Stasis dermatitis  With possible early cellulitis. Will provide keflex x5 days given diabetic and topical steroid. Continue emollient use.  F/u prn.    Myles Gip, DO

## 2022-06-21 ENCOUNTER — Ambulatory Visit: Payer: BC Managed Care – PPO | Admitting: Physical Therapy

## 2022-06-27 ENCOUNTER — Ambulatory Visit: Payer: BC Managed Care – PPO | Attending: Family Medicine | Admitting: Physical Therapy

## 2022-06-27 DIAGNOSIS — M25651 Stiffness of right hip, not elsewhere classified: Secondary | ICD-10-CM | POA: Diagnosis not present

## 2022-06-27 DIAGNOSIS — M5416 Radiculopathy, lumbar region: Secondary | ICD-10-CM | POA: Diagnosis not present

## 2022-06-27 NOTE — Therapy (Signed)
OUTPATIENT PHYSICAL THERAPY THORACOLUMBAR EVALUATION   Patient Name: Elijah Mora MRN: [id number] DOB:17-Nov-1970, 51 y.o., male Today's Date: 06/29/2022   PT End of Session - 06/29/22 1239     Visit Number 1    Number of Visits 17    Date for PT Re-Evaluation 09/23/22    Authorization - Visit Number 1    Authorization - Number of Visits 10    PT Start Time 1324    PT Stop Time 1430    PT Time Calculation (min) 45 min    Activity Tolerance Patient tolerated treatment well    Behavior During Therapy WFL for tasks assessed/performed             Past Medical History:  Diagnosis Date   Anxiety    Cancer ([place])    SYNOVIAL CARCINOMA   GERD (gastroesophageal reflux disease)    OCC   History of kidney stones    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 Dr. Konrad Penta   KIDNEY STONE SURGERY     Synovial cancer     behind right knee. Off Chemo 06/1999   URETEROSCOPY WITH HOLMIUM LASER LITHOTRIPSY Right 02/10/2017   Procedure: URETEROSCOPY WITH HOLMIUM LASER LITHOTRIPSY;  Surgeon: Nickie Retort, MD;  Location: [facility];  Service: Urology;  Laterality: Right;   Patient Active Problem List   Diagnosis Date Noted   Venous stasis dermatitis of right lower extremity 06/20/2022   Hypogonadism in male 08/13/2021   Type 2 diabetes mellitus without complication, without long-term current use of insulin ([place])  05/29/2020   Primary hypertension 05/29/2020   Morbid obesity ([place]) 05/29/2020   Lumbar radiculopathy 12/06/2018   Localized edema 02/20/2018   Femoral-popliteal bypass graft occlusion, right ([place]) 01/03/2016   Acute anxiety 09/08/2015   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[place]) 05/08/2015   Dizziness 05/08/2015   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 Has had an episode of this a year ago that was better after a cortisone injection. Pt reports after this pain he took a course of oral steroids and it eased his pain greatly. Still has pain in the R side of the low back into the middle of the buttock- comes on first thing in the morning until he has moving for about 53mns, sitting >1hour, lifting, forward bending. 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 Has a good ergonomic setup- working to get a standing desk. Pt reports currently pain 1/10; lowest 0/10; highest 5/10 described as sharp, intense, burning. Prior to low back injury patient was working out with a pPhysiological scientistand playing golf a couple times a month and would like to get back to these. 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 Subsequent nerve damage with visible atrophy of R lower leg. Pt denies N/V, B&B changes, unexplained weight fluctuation, saddle paresthesia, fever, night sweats, or unrelenting night pain at this time.    PAIN:  Are you having pain? Yes: NPRS scale: 1/10 Pain location: R side of LB and buttock Pain description: sharp, intense  Aggravating factors: sitting >1hour, lifting, forward bending Relieving factors: steroids   PRECAUTIONS: None  WEIGHT BEARING RESTRICTIONS No  FALLS:  Has patient fallen in last 6 months? No  LIVING ENVIRONMENT: Lives with: lives with their spouse and lives with their daughter Lives in: House/apartment Stairs: Yes: Internal: 12 steps; on left going up and External: 3 steps; on left going up Has following equipment at home: None  OCCUPATION: 40hrs HR manager  PLOF: Independent  PATIENT GOALS Preventing this pain from happening again    OBJECTIVE:   DIAGNOSTIC FINDINGS:  XRAY 05/17/22 1. There is increased moderate disc space loss at L5-S1. 2. Slight scoliosis with grade 1 degenerative retrolisthesis L4-5. 3. Degenerative disc changes and spondylosis L3-4 and more so L4-5. Slight facet spurring.  PATIENT SURVEYS:  FOTO 50 goal of 741 SCREENING FOR RED FLAGS: Bowel or bladder incontinence: No Spinal tumors: No Cauda equina syndrome: No Compression fracture: No Abdominal aneurysm: No  COGNITION:  Overall cognitive status: Within functional limits for tasks assessed     SENSATION: WFL  MUSCLE LENGTH: Hamstrings: Right shortened deg; Left shortened deg Thomas test: Right shortened deg; Left WNL deg  POSTURE: rounded shoulders, forward head, increased thoracic kyphosis, flexed trunk , and weight shift left  PALPATION: TTP with concordant pain to deep palpation over piriformis at glute max Latent trigger points at bilat lumbar paraspinals and QL  LUMBAR ROM:   Active  A/PROM   eval  Flexion WNL with concordant pain  Extension WNL  Right lateral flexion WNL  Left lateral flexion WNL  Right rotation WNL with concordant pain  Left rotation WNL   (Blank rows = not tested)  LOWER EXTREMITY ROM:    All AROM WNL except R ankle DF -10 d/t lack of available activation   LOWER EXTREMITY MMT:    MMT Right eval Left eval  Hip flexion 5 5  Hip extension 3+ 4-  Hip abduction 4 5  Hip adduction    Hip internal rotation 5 5  Hip external rotation 5 5  Knee flexion 5 5  Knee extension 5  5  Ankle dorsiflexion 2- 5  Ankle plantarflexion 5 5  Ankle inversion    Ankle eversion    Single Leg heel raises: R unable L 30  (Blank rows = not tested)  LUMBAR SPECIAL TESTS:  Straight leg raise test: Negative, Slump test: Positive, and Thomas test: Negative Piriformis test positive  FUNCTIONAL TESTS:  5 times sit to stand: 13sec 10 meter walk test: 0.17ms  GAIT: Distance walked: 241mssistive device utilized: None Level of assistance: Modified independence Comments: slight R ankle eversion for foot clearance, pt reports this is more pronounced with increased walking    TODAY'S TREATMENT  Patient was educated on diagnosis, anatomy and pathology involved, prognosis, role of PT, and was given an HEP, demonstrating exercise with proper form following verbal and tactile cues, and was given a paper hand out to continue exercise at home. Pt was educated on and agreed to plan of care. Repeated lumbar ext Pirifromis stretch  Education on gait with up the chain implications (ie hip ER causing increased piriformis tension    PATIENT EDUCATION:  Education details: Patient was educated on diagnosis, anatomy and pathology involved, prognosis, role of PT, and was given an HEP, demonstrating exercise with proper form following verbal and tactile cues, and was given a paper hand out to continue exercise at home. Pt was educated on and agreed to plan of care.  Person  educated: Patient Education method: Explanation, Demonstration, and Handouts Education comprehension: verbalized understanding, returned demonstration, and verbal cues required   HOME EXERCISE PROGRAM: Repeated lumbar ext Pirifromis stretch  ASSESSMENT:  CLINICAL IMPRESSION: Patient is a [age].o. male presenting with sciatica symptoms with definite piriformis/deep hip rotators contribution, with some signs of lumbar radiculopathy contribution as well (centralizing movements, positive slump test. Impairments  in decreased hip mobility, decreased active dorsiflexion due to nerve injury, abnormal gait, decreased activity tolerance, decreased gait speed, decreased hip and core strength, and pain. Activity limitations in forward bending, lifting, community ambulation distance,    OBJECTIVE IMPAIRMENTS Abnormal gait, cardiopulmonary status limiting activity, decreased activity tolerance, decreased balance, decreased coordination, decreased endurance, decreased mobility, difficulty walking, decreased ROM, decreased strength, increased fascial restrictions, impaired perceived functional ability, impaired flexibility, impaired tone, impaired UE functional use, improper body mechanics, postural dysfunction, obesity, and pain.   ACTIVITY LIMITATIONS carrying, lifting, bending, standing, stairs, transfers, and dressing  PARTICIPATION LIMITATIONS: meal prep, cleaning, driving, community activity, occupation, and yard work  PERSONAL FACTORS Age, Education, Fitness, Past/current experiences, Social background, and 3+ comorbidities: anxiety, PMH synovial carcinoma, GERD, nerve damage R lower leg  are also affecting patient's functional outcome.   REHAB POTENTIAL: Good  CLINICAL DECISION MAKING: Evolving/moderate complexity  EVALUATION COMPLEXITY: Moderate   GOALS: Goals reviewed with patient? Yes  SHORT TERM GOALS: Target date: 07/27/2022  Pt will be independent with HEP in order to improve strength  and balance in order to decrease fall risk and improve function at home and work. Baseline: HEP given  Goal status: INITIAL   LONG TERM GOALS: Target date: 08/24/2022  Patient will increase FOTO score to 70 to demonstrate predicted increase in functional mobility to complete ADLs Baseline: 50 Goal status: INITIAL  2.  Pt will decrease worst pain as reported on NPRS by at least 3 points in order to demonstrate clinically significant reduction in pain.  Baseline: 5/10 Goal status: INITIAL  3.  Pt will increase 6MWT by at least [id number] order to demonstrate clinically significant improvement in muscular and cardiopulmonary endurance and community  ambulation  Baseline: next visit Goal status: INITIAL  4.  strengthe Baseline: next visit Goal status: INITIAL     PLAN: PT FREQUENCY: 1-2x/week  PT DURATION: 8 weeks  PLANNED INTERVENTIONS: Therapeutic exercises, Therapeutic activity, Neuromuscular re-education, Balance training, Gait training, Patient/Family education, Self Care, Joint mobilization, Joint manipulation, Stair training, DME instructions, Dry Needling, Electrical stimulation, Spinal manipulation, Spinal mobilization, Cryotherapy, Moist heat, Ultrasound, Ionotophoresis 63m/ml Dexamethasone, Manual therapy, and Re-evaluation.  PLAN FOR NEXT SESSION: 6MWT, SLS, leg press 1RM  CDurwin RegesDPT  CDurwin Reges PT 06/29/2022, 12:54 PM

## 2022-06-29 ENCOUNTER — Encounter: Payer: Self-pay | Admitting: Physical Therapy

## 2022-06-29 ENCOUNTER — Ambulatory Visit: Payer: BC Managed Care – PPO | Admitting: Physical Therapy

## 2022-07-04 ENCOUNTER — Encounter: Payer: BC Managed Care – PPO | Admitting: Physical Therapy

## 2022-07-06 ENCOUNTER — Ambulatory Visit: Payer: BC Managed Care – PPO | Admitting: Physical Therapy

## 2022-07-06 ENCOUNTER — Encounter: Payer: Self-pay | Admitting: Physical Therapy

## 2022-07-06 DIAGNOSIS — M5416 Radiculopathy, lumbar region: Secondary | ICD-10-CM | POA: Diagnosis not present

## 2022-07-06 DIAGNOSIS — M25651 Stiffness of right hip, not elsewhere classified: Secondary | ICD-10-CM | POA: Diagnosis not present

## 2022-07-06 NOTE — Therapy (Signed)
OUTPATIENT PHYSICAL THERAPY THORACOLUMBAR EVALUATION   Patient Name: Elijah Mora MRN: [id number] DOB:03-06-1971, 51 y.o., male Today's Date: 07/06/2022   PT End of Session - 07/06/22 0840     Visit Number 2    Number of Visits 17    Date for PT Re-Evaluation 09/23/22    Authorization - Visit Number 2    Authorization - Number of Visits 10    PT Start Time [phone]    PT Stop Time 0916    PT Time Calculation (min) 38 min    Activity Tolerance Patient tolerated treatment well    Behavior During Therapy 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 Dr. Konrad Penta   KIDNEY STONE SURGERY     Synovial cancer     behind right knee. Off Chemo 06/1999   URETEROSCOPY WITH HOLMIUM LASER LITHOTRIPSY Right 02/10/2017   Procedure: URETEROSCOPY WITH HOLMIUM LASER LITHOTRIPSY;  Surgeon: Nickie Retort, MD;  Location: [facility] ORS;  Service: Urology;  Laterality: Right;   Patient Active Problem List   Diagnosis Date Noted   Venous stasis dermatitis of right lower extremity 06/20/2022   Hypogonadism in male 08/13/2021   Type 2 diabetes mellitus without complication, without long-term current use of insulin ([place])  05/29/2020   Primary hypertension 05/29/2020   Morbid obesity ([place]) 05/29/2020   Lumbar radiculopathy 12/06/2018   Localized edema 02/20/2018   Femoral-popliteal bypass graft occlusion, right ([place]) 01/03/2016   Acute anxiety 09/08/2015   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 Pt reports minimal pain. Notes that his hip resest in ER position while laying flat. Is completing lumbar ext daily and has had no electrical sensations, and minimal LBP with this. Is still having pain across top of R glute.   PERTINENT HISTORY:  Pt is a 51 year male presenting with R sided radicular lumbar symptoms after a long drive to [place] in July 2023. Has had an episode of this a year ago that was better after a cortisone injection. Pt reports after this pain he took a course of oral steroids and it eased his pain greatly. Still has pain in the R side of the low back into the middle of the buttock- comes on first thing in the morning until he has moving for about 40mns, sitting >1hour, lifting, forward bending. Pt is an HR manager 40hrs/week[place] Has a good ergonomic setup- working to get a standing desk. Pt reports currently pain 1/10; lowest 0/10; highest 5/10 described as sharp, intense, burning. Prior to low back injury patient was  working out with a pPhysiological scientistand playing golf a couple times a month and would like to get back to these. 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 Subsequent nerve damage with visible atrophy of R lower leg. Pt denies N/V, B&B changes, unexplained weight fluctuation, saddle paresthesia, fever, night sweats, or unrelenting night pain at this time.    PAIN:  Are you having pain? Yes: NPRS scale: 1/10 Pain location: R side of LB and buttock Pain description: sharp, intense  Aggravating factors: sitting >1hour, lifting, forward bending Relieving factors: steroids   PRECAUTIONS: None  WEIGHT BEARING RESTRICTIONS No  FALLS:  Has patient fallen in last 6 months? No  LIVING ENVIRONMENT: Lives with: lives with their spouse and lives with their daughter Lives in: House/apartment Stairs: Yes: Internal: 12 steps; on left going up and External: 3 steps; on left going up Has following equipment at home: None  OCCUPATION: 40hrs HR manager  PLOF: Independent  PATIENT GOALS Preventing this pain from happening again    OBJECTIVE:   DIAGNOSTIC FINDINGS:  XRAY 05/17/22 1. There is increased moderate disc space loss at L5-S1. 2. Slight scoliosis with grade 1 degenerative retrolisthesis L4-5. 3. Degenerative disc changes and spondylosis L3-4 and more so L4-5. Slight facet spurring.  PATIENT SURVEYS:  FOTO 50 goal of 794 SCREENING FOR RED FLAGS: Bowel or bladder incontinence: No Spinal tumors: No Cauda equina syndrome: No Compression fracture: No Abdominal aneurysm: No  COGNITION:  Overall cognitive status: Within functional limits for tasks assessed     SENSATION: WFL  MUSCLE LENGTH: Hamstrings: Right shortened deg; Left shortened deg Thomas test: Right shortened deg; Left WNL deg  POSTURE: rounded shoulders, forward head, increased thoracic kyphosis, flexed trunk , and weight shift  left  PALPATION: TTP with concordant pain to deep palpation over piriformis at glute max Latent trigger points at bilat lumbar paraspinals and QL  LUMBAR ROM:   Active  A/PROM  eval  Flexion WNL with concordant pain  Extension WNL  Right lateral flexion WNL  Left lateral flexion WNL  Right rotation WNL with concordant pain  Left rotation WNL   (Blank rows = not tested)  LOWER EXTREMITY ROM:    All AROM WNL except R ankle DF -10 d/t lack of available activation   LOWER EXTREMITY MMT:  MMT Right eval Left eval  Hip flexion 5 5  Hip extension 3+ 4-  Hip abduction 4 5  Hip adduction    Hip internal rotation 5 5  Hip external rotation 5 5  Knee flexion 5 5  Knee extension 5 5  Ankle dorsiflexion 2- 5  Ankle plantarflexion 5 5  Ankle inversion    Ankle eversion    Single Leg heel raises: R unable L 30  (Blank rows = not tested)  LUMBAR SPECIAL TESTS:  Straight leg raise test: Negative, Slump test: Positive, and Thomas test: Negative Piriformis test positive  FUNCTIONAL TESTS:  5 times sit to stand: 13sec 10 meter walk test: 0.72ms  GAIT: Distance walked: 216mssistive device utilized: None Level of assistance: Modified independence Comments: slight R ankle eversion for foot clearance, pt reports this is more pronounced with increased walking    TODAY'S TREATMENT  6MWT [id number] education on current gait pattern with  Lower trunk rotation x20 SKTC in hooklying x10sec hold; with CLLE ext 2x 10sec hold Pirifromis stretch 30sec Hooklying figure 4 stretch 30sec Prone prop 1mi19mProne press x12   Bridge with adduction/IR ball squeeze 2x 10 with good carry over of initial demo  L single leg squat with R heel touch for balance from elevated mat 3x 10 with heavy cuing for glute max activaiton with decent carry over Hip flexor stretch on stair 30sec hold Standing quad stretch 30sec hold   PATIENT EDUCATION:  Education details: Patient was educated on  diagnosis, anatomy and pathology involved, prognosis, role of PT, and was given an HEP, demonstrating exercise with proper form following verbal and tactile cues, and was given a paper hand out to continue exercise at home. Pt was educated on and agreed to plan of care.  Person educated: Patient Education method: Explanation, Demonstration, and Handouts Education comprehension: verbalized understanding, returned demonstration, and verbal cues required   HOME EXERCISE PROGRAM: Repeated lumbar ext Pirifromis stretch  ASSESSMENT:  CLINICAL IMPRESSION: PT initiated therex progression for increased hip and lumbar mobility and core/hip strengthening with success. Patient is able to comply with all cuing for proper technique of therex with good effort throughout session. Through 6MWT pt demonstrates decreased DF for foot clearance with hip Er and ankle eversion compensation. PT educated patient on implications of this up the chain and habits in resting LE in ER with understanding. Suggestion of DF brace given. PT will continue progression as able.    OBJECTIVE IMPAIRMENTS Abnormal gait, cardiopulmonary status limiting activity, decreased activity tolerance, decreased balance, decreased coordination, decreased endurance, decreased mobility, difficulty walking, decreased ROM, decreased strength, increased fascial restrictions, impaired perceived functional ability, impaired flexibility, impaired tone, impaired UE functional use, improper body mechanics, postural dysfunction, obesity, and pain.   ACTIVITY LIMITATIONS carrying, lifting, bending, standing, stairs, transfers, and dressing  PARTICIPATION LIMITATIONS: meal prep, cleaning, driving, community activity, occupation, and yard work  PERSONAL FACTORS Age, Education, Fitness, Past/current experiences, Social background, and 3+ comorbidities: anxiety, PMH synovial carcinoma, GERD, nerve damage R lower leg  are also affecting patient's functional  outcome.   REHAB POTENTIAL: Good  CLINICAL DECISION MAKING: Evolving/moderate complexity  EVALUATION COMPLEXITY: Moderate   GOALS: Goals reviewed with patient? Yes  SHORT TERM GOALS: Target date: 08/03/2022  Pt will be independent with HEP in order to improve strength and balance in order to decrease fall risk and improve function at home and work. Baseline: HEP given  Goal status: INITIAL   LONG TERM GOALS: Target date: 08/31/2022  Patient will increase FOTO score to 70 to demonstrate predicted increase in functional mobility to complete ADLs Baseline: 50 Goal status: INITIAL  2.  Pt will decrease worst pain as reported on NPRS by at least 3 points in order to demonstrate clinically significant reduction in pain.  Baseline: 5/10 Goal status: INITIAL  3.  Pt will increase 6MWT by at least 33m(1666f in order to demonstrate clinically significant improvement in muscular and cardiopulmonary endurance and community ambulation  Baseline: next visit Goal status: INITIAL  4.  strengthe Baseline: next visit Goal status: INITIAL     PLAN: PT FREQUENCY: 1-2x/week  PT DURATION: 8 weeks  PLANNED INTERVENTIONS: Therapeutic exercises, Therapeutic activity, Neuromuscular re-education, Balance training, Gait training, Patient/Family education, Self Care, Joint mobilization, Joint manipulation, Stair training, DME instructions, Dry Needling, Electrical stimulation, Spinal manipulation, Spinal mobilization, Cryotherapy, Moist heat, Ultrasound, Ionotophoresis 80m46ml Dexamethasone, Manual therapy, and Re-evaluation.  PLAN FOR NEXT SESSION: 6MWT, SLS, leg press 1RM  CheDurwin RegesT  CheDurwin RegesT 07/06/2022, 9:18 AM

## 2022-07-07 ENCOUNTER — Encounter: Payer: BC Managed Care – PPO | Admitting: Physical Therapy

## 2022-07-08 ENCOUNTER — Other Ambulatory Visit: Payer: Self-pay | Admitting: Family Medicine

## 2022-07-08 DIAGNOSIS — I10 Essential (primary) hypertension: Secondary | ICD-10-CM

## 2022-07-08 NOTE — Telephone Encounter (Signed)
Requested medication (s) are due for refill today: yes  Requested medication (s) are on the active medication list: yes  Last refill:  06/16/22 / Future visit scheduled: yes  Notes to clinic:  Unable to refill per protocol, REQUEST FOR 90 DAYS PRESCRIPTION. DX Code Needed     Requested Prescriptions  Pending Prescriptions Disp Refills   valsartan-hydrochlorothiazide (DIOVAN-HCT) 80-12.5 MG tablet [Pharmacy Med Name: VALSARTAN-HCTZ 80-12.5 MG TAB] 90 tablet 1    Sig: TAKE 1 TABLET BY MOUTH EVERY DAY     Cardiovascular: ARB + Diuretic Combos Failed - 07/08/2022  1:32 PM      Failed - K in normal range and within 180 days    Potassium  Date Value Ref Range Status  07/30/2021 4.1 3.5 - 5.2 mmol/L Final  08/05/2013 4.1 3.5 - 5.1 mmol/L Final         Failed - Na in normal range and within 180 days    Sodium  Date Value Ref Range Status  07/30/2021 142 134 - 144 mmol/L Final  08/05/2013 142 136 - 145 mmol/L Final         Failed - Cr in normal range and within 180 days    Creatinine  Date Value Ref Range Status  08/05/2013 1.11 0.60 - 1.30 mg/dL Final   Creatinine, Ser  Date Value Ref Range Status  07/30/2021 1.08 0.76 - 1.27 mg/dL Final         Failed - eGFR is 10 or above and within 180 days    EGFR (African American)  Date Value Ref Range Status  08/05/2013 >60  Final   GFR calc Af Amer  Date Value Ref Range Status  05/29/2020 112 >59 mL/min/1.73 Final    Comment:    **[facility] currently reports eGFR in compliance with the current**   recommendations of the [employer]. [facility] will   update reporting as new guidelines are published from the NKF-ASN   Task force.    EGFR (Non-African Amer.)  Date Value Ref Range Status  08/05/2013 >60  Final    Comment:    eGFR values <66m/min/1.73 m2 may be an indication of chronic kidney disease (CKD). Calculated eGFR is useful in patients with stable renal function. The eGFR calculation will not be reliable  in acutely ill patients when serum creatinine is changing rapidly. It is not useful in  patients on dialysis. The eGFR calculation may not be applicable to patients at the low and high extremes of body sizes, pregnant women, and vegetarians.    GFR calc non Af Amer  Date Value Ref Range Status  05/29/2020 97 >59 mL/min/1.73 Final   eGFR  Date Value Ref Range Status  07/30/2021 85 >59 mL/min/1.73 Final         Failed - Last BP in normal range    BP Readings from Last 1 Encounters:  06/20/22 (!) 140/89         Passed - Patient is not pregnant      Passed - Valid encounter within last 6 months    Recent Outpatient Visits           2 weeks ago Venous stasis dermatitis of right lower extremity   [place]   1 month ago [facility]   4 months ago Type 2 diabetes mellitus without complication, without long-term current use of insulin ([facility]   [place]   7 months ago Cellulitis of right lower extremity   [place], MD   8 months ago Hypogonadism in male   [place], Kirstie Peri, MD

## 2022-07-11 ENCOUNTER — Telehealth: Payer: BC Managed Care – PPO | Admitting: Family Medicine

## 2022-07-11 ENCOUNTER — Ambulatory Visit: Payer: Self-pay

## 2022-07-11 DIAGNOSIS — U071 COVID-19: Secondary | ICD-10-CM | POA: Diagnosis not present

## 2022-07-11 MED ORDER — MOLNUPIRAVIR EUA 200MG CAPSULE: 4.0000 | ORAL_CAPSULE | Freq: Two times a day (BID) | ORAL | 0 refills | Status: AC

## 2022-07-11 MED ORDER — BENZONATATE 100 MG PO CAPS: 100.0000 mg | ORAL_CAPSULE | Freq: Two times a day (BID) | ORAL | 0 refills | Status: DC | PRN

## 2022-07-11 NOTE — Progress Notes (Signed)
Virtual Visit Consent   Elijah Mora, you are scheduled for a virtual visit with a [facility] provider today. Just as with appointments in the office, your consent must be obtained to participate. Your consent will be active for this visit and any virtual visit you may have with one of our providers in the next 365 days. If you have a MyChart account, a copy of this consent can be sent to you electronically.  As this is a virtual visit, video technology does not allow for your provider to perform a traditional examination. This may limit your provider's ability to fully assess your condition. If your provider identifies any concerns that need to be evaluated in person or the need to arrange testing (such as labs, EKG, etc.), we will make arrangements to do so. Although advances in technology are sophisticated, we cannot ensure that it will always work on either your end or our end. If the connection with a video visit is poor, the visit may have to be switched to a telephone visit. With either a video or telephone visit, we are not always able to ensure that we have a secure connection.  By engaging in this virtual visit, you consent to the provision of healthcare and authorize for your insurance to be billed (if applicable) for the services provided during this visit. Depending on your insurance coverage, you may receive a charge related to this service.  I need to obtain your verbal consent now. Are you willing to proceed with your visit today? Elijah Mora has provided verbal consent on 07/11/2022 for a virtual visit (video or telephone). Perlie Mayo, NP  Date: 07/11/2022 10:58 AM  Virtual Visit via Video Note   I, Perlie Mayo, connected with  Elijah Mora  ([id number], 09-08-1971) on 07/11/22 at 11:00 AM EDT by a video-enabled telemedicine application and verified that I am speaking with the correct person using two identifiers.  Location: Patient: Virtual Visit  Location Patient: Home Provider: Virtual Visit Location Provider: Home Office   I discussed the limitations of evaluation and management by telemedicine and the availability of in person appointments. The patient expressed understanding and agreed to proceed.    History of Present Illness: Elijah Mora is a 51 y.o. who identifies as a male who was assigned male at birth, and is being seen today for covid +   Onset- yesterday evening with cough, congestion Symptoms- developed body aches, headache, cough, congestion, sore throat from drainage, unsure of fever- due to taking NSAIDs for body aches- feels cold, fatigue. Modifiers NSAIDs  Denies chest pain, shortness of breath, ear pain Known sick contacts - wife- + covid  Vaccines two part series no boosters  Known DM- has not taking CBG in last several days due to batteries dying in machine. Usually around 140's well controlled.    Problems:  Patient Active Problem List   Diagnosis Date Noted   Venous stasis dermatitis of right lower extremity 06/20/2022   Hypogonadism in male 08/13/2021   Type 2 diabetes mellitus without complication, without long-term current use of insulin ([place]) 05/29/2020   Primary hypertension 05/29/2020   Morbid obesity ([place]) 05/29/2020   Lumbar radiculopathy 12/06/2018   Localized edema 02/20/2018   Femoral-popliteal bypass graft occlusion, right (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/04/1997    Allergies: No Known Allergies Medications:  Current Outpatient Medications:    aspirin 81 MG tablet, Take 81 mg by mouth daily., Disp: , Rfl:    atorvastatin (LIPITOR) 40 MG tablet, Take 1 tablet by mouth daily., Disp: , Rfl:    Blood Glucose Monitoring Suppl (ONE TOUCH ULTRA 2) w/Device KIT, Use to check sugar daily for type 2 diabetes E11.9, Disp: 1 kit, Rfl: 0   cetirizine  (ZYRTEC) 10 MG tablet, Take 10 mg by mouth daily., Disp: , Rfl:    cyclobenzaprine (FLEXERIL) 10 MG tablet, Take 1 tablet by mouth 2 (two) times daily., Disp: , Rfl:    escitalopram (LEXAPRO) 10 MG tablet, TAKE 1 TABLET BY MOUTH EVERY DAY, Disp: 90 tablet, Rfl: 4   fluticasone (FLONASE) 50 MCG/ACT nasal spray, Place 1 spray into both nostrils daily., Disp: , Rfl:    glucose blood (ONETOUCH ULTRA) test strip, Use to check sugar daily for type 2 diabetes E11.9, Disp: 100 each, Rfl: 4   ibuprofen (ADVIL,MOTRIN) 800 MG tablet, Take 1 tablet (800 mg total) by mouth every 8 (eight) hours as needed., Disp: 90 tablet, Rfl: 0   Lancets (ONETOUCH ULTRASOFT) lancets, Use to check blood sugar daily for type 2 diabetes E11.9, Disp: 100 each, Rfl: 4   metFORMIN (GLUCOPHAGE) 500 MG tablet, Take 1 tablet (500 mg total) by mouth 2 (two) times daily with a meal., Disp: 180 tablet, Rfl: 4   Testosterone 20.25 MG/ACT (1.62%) GEL, Place 4 Pump onto the skin daily., Disp: 150 g, Rfl: 1   triamcinolone ointment (KENALOG) 0.5 %, Apply 1 Application topically 2 (two) times daily., Disp: 60 g, Rfl: 3   valsartan-hydrochlorothiazide (DIOVAN-HCT) 80-12.5 MG tablet, TAKE 1 TABLET BY MOUTH EVERY DAY, Disp: 90 tablet, Rfl: 1  Observations/Objective: Patient is well-developed, well-nourished in no acute distress.  Resting comfortably  at home.  Head is normocephalic, atraumatic.  No labored breathing.  Speech is clear and coherent with logical content.  Patient is alert and oriented at baseline.  Cough  Assessment and Plan:  1. COVID-19  - molnupiravir EUA (LAGEVRIO) 200 mg CAPS capsule; Take 4 capsules (800 mg total) by mouth 2 (two) times daily for 5 days.  Dispense: 40 capsule; Refill: 0 - benzonatate (TESSALON) 100 MG capsule; Take 1 capsule (100 mg total) by mouth 2 (two) times daily as needed for cough.  Dispense: 20 capsule; Refill: 0  -rest -hydrate -above antiviral as prescribed -covid isolation guidelines  on avs with OTC info as discussed -mucinex, b complex are helpful -please start checking bood sugar to monitor DM for changes   Reviewed side effects, risks and benefits of medication.   Patient acknowledged agreement and understanding of the plan.  Past Medical, Surgical, Social History, Allergies, and Medications have been Reviewed.    Follow Up Instructions: I discussed the assessment and treatment plan with the patient. The patient was provided an opportunity to ask questions and all were answered. The patient agreed with the plan and demonstrated an understanding of the instructions.  A copy of instructions were sent to the patient via MyChart unless otherwise noted below.   The patient was advised to call back or seek an in-person evaluation if the symptoms worsen or if the condition fails to improve as anticipated.  Time:  I spent 10 minutes with the patient via telehealth technology discussing the above problems/concerns.    Perlie Mayo, NP

## 2022-07-11 NOTE — Patient Instructions (Signed)
Elijah Mora, thank you for joining Hannah M Mills, NP for today's virtual visit.  While this provider is not your primary care provider (PCP), if your PCP is located in our provider database this encounter information will be shared with them immediately following your visit.  A [facility] MyChart account gives you access to today's visit and all your visits, tests, and labs performed at [facility] " click here if you don't have a [facility] MyChart account or go to [email or web address]  Consent: (Patient) Elijah Mora provided verbal consent for this virtual visit at the beginning of the encounter.  Current Medications:  Current Outpatient Medications:    benzonatate (TESSALON) 100 MG capsule, Take 1 capsule (100 mg total) by mouth 2 (two) times daily as needed for cough., Disp: 20 capsule, Rfl: 0   molnupiravir EUA (LAGEVRIO) 200 mg CAPS capsule, Take 4 capsules (800 mg total) by mouth 2 (two) times daily for 5 days., Disp: 40 capsule, Rfl: 0   aspirin 81 MG tablet, Take 81 mg by mouth daily., Disp: , Rfl:    atorvastatin (LIPITOR) 40 MG tablet, Take 1 tablet by mouth daily., Disp: , Rfl:    Blood Glucose Monitoring Suppl (ONE TOUCH ULTRA 2) w/Device KIT, Use to check sugar daily for type 2 diabetes E11.9, Disp: 1 kit, Rfl: 0   cetirizine (ZYRTEC) 10 MG tablet, Take 10 mg by mouth daily., Disp: , Rfl:    cyclobenzaprine (FLEXERIL) 10 MG tablet, Take 1 tablet by mouth 2 (two) times daily., Disp: , Rfl:    escitalopram (LEXAPRO) 10 MG tablet, TAKE 1 TABLET BY MOUTH EVERY DAY, Disp: 90 tablet, Rfl: 4   fluticasone (FLONASE) 50 MCG/ACT nasal spray, Place 1 spray into both nostrils daily., Disp: , Rfl:    glucose blood (ONETOUCH ULTRA) test strip, Use to check sugar daily for type 2 diabetes E11.9, Disp: 100 each, Rfl: 4   ibuprofen (ADVIL,MOTRIN) 800 MG tablet, Take 1 tablet (800 mg total) by mouth every 8 (eight) hours as needed., Disp: 90 tablet, Rfl: 0    Lancets (ONETOUCH ULTRASOFT) lancets, Use to check blood sugar daily for type 2 diabetes E11.9, Disp: 100 each, Rfl: 4   metFORMIN (GLUCOPHAGE) 500 MG tablet, Take 1 tablet (500 mg total) by mouth 2 (two) times daily with a meal., Disp: 180 tablet, Rfl: 4   Testosterone 20.25 MG/ACT (1.62%) GEL, Place 4 Pump onto the skin daily., Disp: 150 g, Rfl: 1   triamcinolone ointment (KENALOG) 0.5 %, Apply 1 Application topically 2 (two) times daily., Disp: 60 g, Rfl: 3   valsartan-hydrochlorothiazide (DIOVAN-HCT) 80-12.5 MG tablet, TAKE 1 TABLET BY MOUTH EVERY DAY, Disp: 90 tablet, Rfl: 1   Medications ordered in this encounter:  Meds ordered this encounter  Medications   molnupiravir EUA (LAGEVRIO) 200 mg CAPS capsule    Sig: Take 4 capsules (800 mg total) by mouth 2 (two) times daily for 5 days.    Dispense:  40 capsule    Refill:  0    Order Specific Question:   Supervising Provider    Answer:   LAMPTEY, PHILIP O [[id number]]   benzonatate (TESSALON) 100 MG capsule    Sig: Take 1 capsule (100 mg total) by mouth 2 (two) times daily as needed for cough.    Dispense:  20 capsule    Refill:  0    Order Specific Question:   Supervising Provider    Answer:   LAMPTEY, PHILIP O [[id number]]     *  If you need refills on other medications prior to your next appointment, please contact your pharmacy*  Follow-Up: Call back or seek an in-person evaluation if the symptoms worsen or if the condition fails to improve as anticipated.  [facility] [phone]  Other Instructions Please keep well-hydrated and get plenty of rest. Start a saline nasal rinse to flush out your nasal passages. You can use plain Mucinex to help thin congestion. If you have a humidifier, running in the bedroom at night. I want you to start OTC vitamin D3 1000 units daily, vitamin C 1000 mg daily, and a zinc supplement. Please take prescribed medications as directed.  You have been enrolled in a MyChart symptom  monitoring program. Please answer these questions daily so we can keep track of how you are doing.  You were to quarantine for 5 days from onset of your symptoms.  After day 5, if you have had no fever and you are feeling better, you can end quarantine but need to mask for an additional 5 days. After day 5 if you have a fever or are having significant symptoms, please quarantine for full 10 days.  If you note any worsening of symptoms, any significant shortness of breath or any chest pain, please seek ER evaluation ASAP.  Please do not delay care!  COVID-19: What to Do if You Are Sick If you test positive and are an older adult or someone who is at high risk of getting very sick from COVID-19, treatment may be available. Contact a healthcare provider right away after a positive test to determine if you are eligible, even if your symptoms are mild right now. You can also visit a Test to Treat location and, if eligible, receive a prescription from a provider. Don't delay: Treatment must be started within the first few days to be effective. If you have a fever, cough, or other symptoms, you might have COVID-19. Most people have mild illness and are able to recover at home. If you are sick: Keep track of your symptoms. If you have an emergency warning sign (including trouble breathing), call 911. Steps to help prevent the spread of COVID-19 if you are sick If you are sick with COVID-19 or think you might have COVID-19, follow the steps below to care for yourself and to help protect other people in your home and community. Stay home except to get medical care Stay home. Most people with COVID-19 have mild illness and can recover at home without medical care. Do not leave your home, except to get medical care. Do not visit public areas and do not go to places where you are unable to wear a mask. Take care of yourself. Get rest and stay hydrated. Take over-the-counter medicines, such as acetaminophen, to  help you feel better. Stay in touch with your doctor. Call before you get medical care. Be sure to get care if you have trouble breathing, or have any other emergency warning signs, or if you think it is an emergency. Avoid public transportation, ride-sharing, or taxis if possible. Get tested If you have symptoms of COVID-19, get tested. While waiting for test results, stay away from others, including staying apart from those living in your household. Get tested as soon as possible after your symptoms start. Treatments may be available for people with COVID-19 who are at risk for becoming very sick. Don't delay: Treatment must be started early to be effective--some treatments must begin within 5 days of your first   symptoms. Contact your healthcare provider right away if your test result is positive to determine if you are eligible. Self-tests are one of several options for testing for the virus that causes COVID-19 and may be more convenient than laboratory-based tests and point-of-care tests. Ask your healthcare provider or your local health department if you need help interpreting your test results. You can visit your state, tribal, local, and territorial health department's website to look for the latest local information on testing sites. Separate yourself from other people As much as possible, stay in a specific room and away from other people and pets in your home. If possible, you should use a separate bathroom. If you need to be around other people or animals in or outside of the home, wear a well-fitting mask. Tell your close contacts that they may have been exposed to COVID-19. An infected person can spread COVID-19 starting 48 hours (or 2 days) before the person has any symptoms or tests positive. By letting your close contacts know they may have been exposed to COVID-19, you are helping to protect everyone. See COVID-19 and Animals if you have questions about pets. If you are diagnosed with  COVID-19, someone from the health department may call you. Answer the call to slow the spread. Monitor your symptoms Symptoms of COVID-19 include fever, cough, or other symptoms. Follow care instructions from your healthcare provider and local health department. Your local health authorities may give instructions on checking your symptoms and reporting information. When to seek emergency medical attention Look for emergency warning signs* for COVID-19. If someone is showing any of these signs, seek emergency medical care immediately: Trouble breathing Persistent pain or pressure in the chest New confusion Inability to wake or stay awake Pale, gray, or blue-colored skin, lips, or nail beds, depending on skin tone *This list is not all possible symptoms. Please call your medical provider for any other symptoms that are severe or concerning to you. Call 911 or call ahead to your local emergency facility: Notify the operator that you are seeking care for someone who has or may have COVID-19. Call ahead before visiting your doctor Call ahead. Many medical visits for routine care are being postponed or done by phone or telemedicine. If you have a medical appointment that cannot be postponed, call your doctor's office, and tell them you have or may have COVID-19. This will help the office protect themselves and other patients. If you are sick, wear a well-fitting mask You should wear a mask if you must be around other people or animals, including pets (even at home). Wear a mask with the best fit, protection, and comfort for you. You don't need to wear the mask if you are alone. If you can't put on a mask (because of trouble breathing, for example), cover your coughs and sneezes in some other way. Try to stay at least 6 feet away from other people. This will help protect the people around you. Masks should not be placed on young children under age 2 years, anyone who has trouble breathing, or anyone who  is not able to remove the mask without help. Cover your coughs and sneezes Cover your mouth and nose with a tissue when you cough or sneeze. Throw away used tissues in a lined trash can. Immediately wash your hands with soap and water for at least 20 seconds. If soap and water are not available, clean your hands with an alcohol-based hand sanitizer that contains at least 60% alcohol. Clean   your hands often Wash your hands often with soap and water for at least 20 seconds. This is especially important after blowing your nose, coughing, or sneezing; going to the bathroom; and before eating or preparing food. Use hand sanitizer if soap and water are not available. Use an alcohol-based hand sanitizer with at least 60% alcohol, covering all surfaces of your hands and rubbing them together until they feel dry. Soap and water are the best option, especially if hands are visibly dirty. Avoid touching your eyes, nose, and mouth with unwashed hands. Handwashing Tips Avoid sharing personal household items Do not share dishes, drinking glasses, cups, eating utensils, towels, or bedding with other people in your home. Wash these items thoroughly after using them with soap and water or put in the dishwasher. Clean surfaces in your home regularly Clean and disinfect high-touch surfaces (for example, doorknobs, tables, handles, light switches, and countertops) in your "sick room" and bathroom. In shared spaces, you should clean and disinfect surfaces and items after each use by the person who is ill. If you are sick and cannot clean, a caregiver or other person should only clean and disinfect the area around you (such as your bedroom and bathroom) on an as needed basis. Your caregiver/other person should wait as long as possible (at least several hours) and wear a mask before entering, cleaning, and disinfecting shared spaces that you use. Clean and disinfect areas that may have blood, stool, or body fluids on  them. Use household cleaners and disinfectants. Clean visible dirty surfaces with household cleaners containing soap or detergent. Then, use a household disinfectant. Use a product from EPA's List N: Disinfectants for Coronavirus (COVID-19). Be sure to follow the instructions on the label to ensure safe and effective use of the product. Many products recommend keeping the surface wet with a disinfectant for a certain period of time (look at "contact time" on the product label). You may also need to wear personal protective equipment, such as gloves, depending on the directions on the product label. Immediately after disinfecting, wash your hands with soap and water for 20 seconds. For completed guidance on cleaning and disinfecting your home, visit Complete Disinfection Guidance. Take steps to improve ventilation at home Improve ventilation (air flow) at home to help prevent from spreading COVID-19 to other people in your household. Clear out COVID-19 virus particles in the air by opening windows, using air filters, and turning on fans in your home. Use this interactive tool to learn how to improve air flow in your home. When you can be around others after being sick with COVID-19 Deciding when you can be around others is different for different situations. Find out when you can safely end home isolation. For any additional questions about your care, contact your healthcare provider or state or local health department. 12/15/2020 Content source: National Center for Immunization and Respiratory Diseases (NCIRD), Division of Viral Diseases This information is not intended to replace advice given to you by your health care provider. Make sure you discuss any questions you have with your health care provider. Document Revised: 01/28/2021 Document Reviewed: 01/28/2021 Elsevier Patient Education  2022 Elsevier Inc.      If you have been instructed to have an in-person evaluation today at a local  Urgent Care facility, please use the link below. It will take you to a list of all of our available [facility], including address, phone number and hours of operation. Please do not delay care.  [facility]  If you or a family member do not have a primary care provider, use the link below to schedule a visit and establish care. When you choose a [facility] primary care physician or advanced practice provider, you gain a long-term partner in health. Find a Primary Care Provider  Learn more about [facility]'s in-office and virtual care options: [facility] - Get Care Now  

## 2022-07-11 NOTE — Telephone Encounter (Signed)
Message from Scherrie Gerlach sent at 07/11/2022  9:33 AM EDT  Summary: covid positive/wants medication   Pt tested positive about 11 pm last night.  Has body aches, cough, congestion, the sinus problems that go w/ covid, and is interested in getting paxlovid.          Chief Complaint: cough Symptoms: covid +, feels cold,persistent cough,congestion, body aches, headache,fatigue Frequency: sx started yesterday  Pertinent Negatives: Patient denies breathing difficulty, loss of taste or smell,sore throat Disposition: '[]'$ ED /'[]'$ Urgent Care (no appt availability in office) / '[]'$ Appointment(In office/virtual)/ '[x]'$  [facility]/ '[]'$ Home Care/ '[]'$ Refused Recommended Disposition /'[]'$ [facility]/ '[]'$  Follow-up with PCP Additional Notes: no appts today in office, pt would like appt today.  Reason for Disposition  [1] HIGH RISK patient (e.g., weak immune system, age > 41 years, obesity with BMI 30 or higher, pregnant, chronic lung disease or other chronic medical condition) AND [2] COVID symptoms (e.g., cough, fever)  (Exceptions: Already seen by PCP and no new or worsening symptoms.)  Answer Assessment - Initial Assessment Questions 1. COVID-19 DIAGNOSIS: "How do you know that you have COVID?" (e.g., positive lab test or self-test, diagnosed by doctor or NP/PA, symptoms after exposure).     Self test 2. COVID-19 EXPOSURE: "Was there any known exposure to COVID before the symptoms began?" CDC Definition of close contact: within 6 feet (2 meters) for a total of 15 minutes or more over a 24-hour period.      wife 3. ONSET: "When did the COVID-19 symptoms start?"      yesterday 4. WORST SYMPTOM: "What is your worst symptom?" (e.g., cough, fever, shortness of breath, muscle aches)     Cough and congestion 5. COUGH: "Do you have a cough?" If Yes, ask: "How bad is the cough?"       Yes- persistent  6. FEVER: "Do you have a fever?" If Yes, ask: "What is your temperature, how was it measured,  and when did it start?"     no 7. RESPIRATORY STATUS: "Describe your breathing?" (e.g., normal; shortness of breath, wheezing, unable to speak)      normal 8. BETTER-SAME-WORSE: "Are you getting better, staying the same or getting worse compared to yesterday?"  If getting worse, ask, "In what way?"     worse 9. OTHER SYMPTOMS: "Do you have any other symptoms?"  (e.g., chills, fatigue, headache, loss of smell or taste, muscle pain, sore throat)     Congestion, body aches, headache, fatigue, feels cool 10. HIGH RISK DISEASE: "Do you have any chronic medical problems?" (e.g., asthma, heart or lung disease, weak immune system, obesity, etc.)       Obesity, diabetic 11. VACCINE: "Have you had the COVID-19 vaccine?" If Yes, ask: "Which one, how many shots, when did you get it?"       2 shots 12. PREGNANCY: "Is there any chance you are pregnant?" "When was your last menstrual period?"       N/a 13. O2 SATURATION MONITOR:  "Do you use an oxygen saturation monitor (pulse oximeter) at home?" If Yes, ask "What is your reading (oxygen level) today?" "What is your usual oxygen saturation reading?" (e.g., 95%)       N/a  Protocols used: Coronavirus (COVID-19) Diagnosed or Suspected-A-AH

## 2022-07-12 ENCOUNTER — Encounter: Payer: BC Managed Care – PPO | Admitting: Physical Therapy

## 2022-07-13 ENCOUNTER — Ambulatory Visit: Payer: BC Managed Care – PPO | Admitting: Physical Therapy

## 2022-07-14 ENCOUNTER — Encounter: Payer: BC Managed Care – PPO | Admitting: Physical Therapy

## 2022-07-18 ENCOUNTER — Telehealth: Payer: BC Managed Care – PPO | Admitting: Physician Assistant

## 2022-07-18 ENCOUNTER — Ambulatory Visit: Payer: BC Managed Care – PPO | Admitting: Physical Therapy

## 2022-07-18 DIAGNOSIS — L259 Unspecified contact dermatitis, unspecified cause: Secondary | ICD-10-CM

## 2022-07-18 MED ORDER — PREDNISONE 10 MG PO TABS: ORAL_TABLET | ORAL | 0 refills | Status: AC

## 2022-07-18 NOTE — Progress Notes (Signed)
E Visit for Rash  We are sorry that you are not feeling well. Here is how we plan to help!  Based on what you shared with me it looks like you have contact dermatitis.  Contact dermatitis is a skin rash caused by something that touches the skin and causes irritation or inflammation.  Your skin may be red, swollen, dry, cracked, and itch.  The rash should go away in a few days but can last a few weeks.  If you get a rash, it's important to figure out what caused it so the irritant can be avoided in the future. and I am prescribing a two week course of steroids (37 tablets of 10 mg prednisone).  Days 1-4 take 4 tablets (40 mg) daily  Days 5-8 take 3 tablets (30 mg) daily, Days 9-11 take 2 tablets (20 mg) daily, Days 12-14 take 1 tablet (10 mg) daily.       HOME CARE:  Take cool showers and avoid direct sunlight. Apply cool compress or wet dressings. Take a bath in an oatmeal bath.  Sprinkle content of one Aveeno packet under running faucet with comfortably warm water.  Bathe for 15-20 minutes, 1-2 times daily.  Pat dry with a towel. Do not rub the rash. Use hydrocortisone cream. Take an antihistamine like Benadryl for widespread rashes that itch.  The adult dose of Benadryl is 25-50 mg by mouth 4 times daily. Caution:  This type of medication may cause sleepiness.  Do not drink alcohol, drive, or operate dangerous machinery while taking antihistamines.  Do not take these medications if you have prostate enlargement.  Read package instructions thoroughly on all medications that you take.  GET HELP RIGHT AWAY IF:  Symptoms don't go away after treatment. Severe itching that persists. If you rash spreads or swells. If you rash begins to smell. If it blisters and opens or develops a yellow-brown crust. You develop a fever. You have a sore throat. You become short of breath.  MAKE SURE YOU:  Understand these instructions. Will watch your condition. Will get help right away if you are not  doing well or get worse.  Thank you for choosing an e-visit.  Your e-visit answers were reviewed by a board certified advanced clinical practitioner to complete your personal care plan. Depending upon the condition, your plan could have included both over the counter or prescription medications.  Please review your pharmacy choice. Make sure the pharmacy is open so you can pick up prescription now. If there is a problem, you may contact your provider through MyChart messaging and have the prescription routed to another pharmacy.  Your safety is important to us. If you have drug allergies check your prescription carefully.   For the next 24 hours you can use MyChart to ask questions about today's visit, request a non-urgent call back, or ask for a work or school excuse. You will get an email in the next two days asking about your experience. I hope that your e-visit has been valuable and will speed your recovery.  

## 2022-07-18 NOTE — Progress Notes (Signed)
I have spent 5 minutes in review of e-visit questionnaire, review and updating patient chart, medical decision making and response to patient.   Lyndsay Talamante Cody Kevis Qu, PA-C    

## 2022-07-21 ENCOUNTER — Ambulatory Visit: Payer: BC Managed Care – PPO | Admitting: Physical Therapy

## 2022-07-25 ENCOUNTER — Ambulatory Visit: Payer: BC Managed Care – PPO | Admitting: Physical Therapy

## 2022-07-29 ENCOUNTER — Other Ambulatory Visit: Payer: Self-pay | Admitting: Family Medicine

## 2022-07-29 DIAGNOSIS — E119 Type 2 diabetes mellitus without complications: Secondary | ICD-10-CM

## 2022-08-02 ENCOUNTER — Telehealth: Payer: Self-pay | Admitting: Physical Therapy

## 2022-08-02 ENCOUNTER — Ambulatory Visit: Payer: BC Managed Care – PPO | Attending: Family Medicine | Admitting: Physical Therapy

## 2022-08-02 NOTE — Telephone Encounter (Signed)
Left VM letting patient know future appts are removed from schedule d/t clinic cancellation policy, to call to return to the [facility]

## 2022-08-04 ENCOUNTER — Ambulatory Visit: Payer: BC Managed Care – PPO | Admitting: Physical Therapy

## 2022-09-28 ENCOUNTER — Other Ambulatory Visit: Payer: Self-pay | Admitting: Family Medicine

## 2022-09-28 DIAGNOSIS — F419 Anxiety disorder, unspecified: Secondary | ICD-10-CM

## 2022-09-28 NOTE — Telephone Encounter (Signed)
Requested Prescriptions  Pending Prescriptions Disp Refills   escitalopram (LEXAPRO) 10 MG tablet [Pharmacy Med Name: ESCITALOPRAM 10 MG TABLET] 90 tablet 1    Sig: TAKE 1 TABLET BY MOUTH EVERY DAY     Psychiatry:  Antidepressants - SSRI Passed - 09/28/2022  1:30 AM      Passed - Valid encounter within last 6 months    Recent Outpatient Visits           3 months ago Venous stasis dermatitis of right lower extremity   [place] [facility], DO   4 months ago Lumbar radiculopathy   [facility] Myles Gip, DO   7 months ago Type 2 diabetes mellitus without complication, without long-term current use of insulin [facility])   [facility] Birdie Sons, MD   9 months ago Cellulitis of right lower extremity   [facility] Birdie Sons, MD   11 months ago Hypogonadism in male   [facility], Kirstie Peri, MD

## 2022-10-31 LAB — HM DIABETES EYE EXAM

## 2022-11-02 ENCOUNTER — Encounter: Payer: Self-pay | Admitting: Family Medicine

## 2022-12-23 ENCOUNTER — Other Ambulatory Visit: Payer: Self-pay | Admitting: Family Medicine

## 2022-12-23 DIAGNOSIS — E781 Pure hyperglyceridemia: Secondary | ICD-10-CM

## 2023-01-11 ENCOUNTER — Other Ambulatory Visit: Payer: Self-pay | Admitting: Family Medicine

## 2023-01-11 DIAGNOSIS — I10 Essential (primary) hypertension: Secondary | ICD-10-CM

## 2023-01-13 ENCOUNTER — Encounter: Payer: Self-pay | Admitting: Physician Assistant

## 2023-01-13 ENCOUNTER — Ambulatory Visit: Payer: BC Managed Care – PPO | Admitting: Physician Assistant

## 2023-01-13 VITALS — BP 131/85 | HR 64 | Temp 97.6°F | Ht 70.0 in | Wt 251.0 lb

## 2023-01-13 DIAGNOSIS — T148XXA Other injury of unspecified body region, initial encounter: Secondary | ICD-10-CM | POA: Diagnosis not present

## 2023-01-13 DIAGNOSIS — E119 Type 2 diabetes mellitus without complications: Secondary | ICD-10-CM | POA: Diagnosis not present

## 2023-01-13 LAB — POCT GLYCOSYLATED HEMOGLOBIN (HGB A1C): Hemoglobin A1C: 10 % — AB (ref 4.0–5.6)

## 2023-01-13 MED ORDER — CEPHALEXIN 500 MG PO CAPS: 500.0000 mg | ORAL_CAPSULE | Freq: Two times a day (BID) | ORAL | 0 refills | Status: DC

## 2023-01-13 NOTE — Progress Notes (Unsigned)
Established patient visit   Patient: Elijah Mora   DOB: July 05, 1971   52 y.o. Male  MRN: [id number] Visit Date: 01/13/2023  Today's healthcare provider: Debera Lat, PA-C  CC: an injury of the right 4th finger by cutting vegetable x 4 days ago  Subjective     HPI   Pt stated--injury right 4th finger by cutting vegetable--painful, open wound- 4 days. Tried neosporin and warp the finger. Last edited by Shelly Bombard, CMA on 01/13/2023  1:29 PM.     Has DMII, take Metformin  daily, last A1c was 7.8  Medications: Outpatient Medications Prior to Visit  Medication Sig   aspirin 81 MG tablet Take 81 mg by mouth daily.   atorvastatin (LIPITOR) 40 MG tablet TAKE 1 TABLET (40 MG TOTAL) BY MOUTH DAILY. PLEASE SCHEDULE AN OFFICE VISIT BEFORE ANYMORE REFILLS.   Blood Glucose Monitoring Suppl (ONE TOUCH ULTRA 2) w/Device KIT Use to check sugar daily for type 2 diabetes E11.9   cetirizine (ZYRTEC) 10 MG tablet Take 10 mg by mouth daily.   cyclobenzaprine (FLEXERIL) 10 MG tablet Take 1 tablet by mouth 2 (two) times daily.   escitalopram (LEXAPRO) 10 MG tablet TAKE 1 TABLET BY MOUTH EVERY DAY   fluticasone (FLONASE) 50 MCG/ACT nasal spray Place 1 spray into both nostrils daily.   glucose blood (ONETOUCH ULTRA) test strip Use to check sugar daily for type 2 diabetes E11.9   ibuprofen (ADVIL,MOTRIN) 800 MG tablet Take 1 tablet (800 mg total) by mouth every 8 (eight) hours as needed.   Lancets (ONETOUCH ULTRASOFT) lancets Use to check blood sugar daily for type 2 diabetes E11.9   metFORMIN (GLUCOPHAGE) 500 MG tablet TAKE 1 TABLET BY MOUTH EVERY DAY   triamcinolone ointment (KENALOG) 0.5 % Apply 1 Application topically 2 (two) times daily.   valsartan-hydrochlorothiazide (DIOVAN-HCT) 80-12.5 MG tablet TAKE 1 TABLET BY MOUTH EVERY DAY   [DISCONTINUED] benzonatate (TESSALON) 100 MG capsule Take 1 capsule (100 mg total) by mouth 2 (two) times daily as needed for cough.    [DISCONTINUED] Testosterone 20.25 MG/ACT (1.62%) GEL Place 4 Pump onto the skin daily.   No facility-administered medications prior to visit.    Review of Systems  Constitutional:  Negative for chills and fever.  Skin:  Positive for wound.       Objective    BP 131/85   Pulse 64   Temp 97.6 F (36.4 C)   Ht  (1.778 m)   Wt 251 lb (113.9 kg)   SpO2 98%   BMI 36.01 kg/m    Physical Exam Constitutional:      General: He is not in acute distress.    Appearance: Normal appearance. He is not diaphoretic.  HENT:     Head: Normocephalic.  Eyes:     Conjunctiva/sclera: Conjunctivae normal.  Pulmonary:     Effort: Pulmonary effort is normal. No respiratory distress.  Neurological:     Mental Status: He is alert and oriented to person, place, and time. Mental status is at baseline.     No results found for any visits on 01/13/23.  Assessment & Plan     1. Type 2 diabetes mellitus without complication, without long-term current use of insulin Chronic and previously improving A1C from 10 mo ago was 7.8 Advised to increase Metformin to  and start taking glipizide 5 mg BID - POCT HgB A1C 10.0 - Ambulatory referral to Wound Clinic - cephALEXin (KEFLEX) 500 MG capsule; Take  1 capsule (500 mg total) by mouth 2 (two) times daily.  Dispense: 20 capsule; Refill: 0  2. Open wound Due to injury of the right 4th finger by cutting vegetables New problem X 4 days Uptodate with Tdap In the setting of uncontrolled DMIi - POCT HgB A1C - Ambulatory referral to Wound Clinic, might need graft - cephALEXin (KEFLEX) 500 MG capsule; Take 1 capsule (500 mg total) by mouth 2 (two) times daily.  Dispense: 20 capsule; Refill: 0 Continue wound care with abx ointment Will FU with PCP  No follow-ups on file.     The patient was advised to call back or seek an in-person evaluation if the symptoms worsen or if the condition fails to improve as anticipated.  I discussed the  assessment and treatment plan with the patient. The patient was provided an opportunity to ask questions and all were answered. The patient agreed with the plan and demonstrated an understanding of the instructions.  I, Debera Lat, PA-C have reviewed all documentation for this visit. The documentation on 01/13/2023 for the exam, diagnosis, procedures, and orders are all accurate and complete.  Debera Lat, [facility], MMS [facility] [phone] (phone) [phone] (fax)  [facility]

## 2023-01-15 MED ORDER — GLIPIZIDE 5 MG PO TABS: 5.0000 mg | ORAL_TABLET | Freq: Two times a day (BID) | ORAL | 3 refills | Status: DC

## 2023-01-15 MED ORDER — METFORMIN HCL 1000 MG PO TABS: 1000.0000 mg | ORAL_TABLET | Freq: Two times a day (BID) | ORAL | 1 refills | Status: DC

## 2023-01-17 ENCOUNTER — Encounter: Payer: BC Managed Care – PPO | Attending: Physician Assistant | Admitting: Physician Assistant

## 2023-01-17 DIAGNOSIS — S61204A Unspecified open wound of right ring finger without damage to nail, initial encounter: Secondary | ICD-10-CM | POA: Diagnosis not present

## 2023-01-17 DIAGNOSIS — S61214A Laceration without foreign body of right ring finger without damage to nail, initial encounter: Secondary | ICD-10-CM | POA: Diagnosis not present

## 2023-01-17 DIAGNOSIS — E119 Type 2 diabetes mellitus without complications: Secondary | ICD-10-CM | POA: Insufficient documentation

## 2023-01-17 DIAGNOSIS — Y93G1 Activity, food preparation and clean up: Secondary | ICD-10-CM | POA: Diagnosis not present

## 2023-01-17 DIAGNOSIS — W458XXA Other foreign body or object entering through skin, initial encounter: Secondary | ICD-10-CM | POA: Insufficient documentation

## 2023-01-18 NOTE — Progress Notes (Signed)
Elijah Mora, Elijah Mora ([id number]) [id number] Page 1 of 4 Visit Report for 01/17/2023 Abuse Risk Screen Details Patient Name: Date of Service: Elijah Mora, Elijah Mora. 01/17/2023 9:30 A M Medical Record Number: [id number] Patient Account Number: [id number] Date of Birth/Sex: Treating RN: 10/11/1970 (51 y.o. [name]) Yevonne Pax Primary Care Olivia Royse: [name] Other Clinician: Referring Jerett Odonohue: Treating Avelynn Sellin/Extender: Sunnie Nielsen Weeks in Treatment: 0 Abuse Risk Screen Items Answer ABUSE RISK SCREEN: Has anyone close to you tried to hurt or harm you recentlyo No Do you feel uncomfortable with anyone in your familyo No Has anyone forced you do things that you didnt want to doo No Electronic Signature(s) Signed: 01/17/2023 4:05:57 PM By: Yevonne Pax RN Entered By: Yevonne Pax on 01/17/2023 09:42:58 -------------------------------------------------------------------------------- Activities of Daily Living Details Patient Name: Date of Service: Elijah Mora, Elijah Mora. 01/17/2023 9:30 A M Medical Record Number: [id number] Patient Account Number: [id number] Date of Birth/Sex: Treating RN: Nov 15, 1970 (51 y.o. [name]) Yevonne Pax Primary Care Courteny Egler: [name] Other Clinician: Referring Anaih Brander: Treating Ken Bonn/Extender: Sunnie Nielsen Weeks in Treatment: 0 Activities of Daily Living Items Answer Activities of Daily Living (Please select one for each item) Drive Automobile Completely Able T Medications ake Completely Able Use T elephone Completely Able Care for Appearance Completely Able Use T oilet Completely Able Bath / Shower Completely Able Dress Self Completely Able Feed Self Completely Able Walk Completely Able Get In / Out Bed Completely Able Housework Completely Able Prepare Meals Completely Able Handle Money Completely Able Shop for Self Completely Able Electronic Signature(s) Signed: 01/17/2023  4:05:57 PM By: Yevonne Pax RN Entered By: Yevonne Pax on 01/17/2023 09:43:21 -------------------------------------------------------------------------------- Education Screening Details Patient Name: Date of Service: Elijah Mora, Elijah Mora. 01/17/2023 9:30 A M Medical Record Number: [id number] Patient Account Number: [id number] Date of Birth/Sex: Treating RN: September 12, 1971 (51 y.o. [name] Primary Care Felicita Nuncio: [name] Other Clinician: Referring Juliannah Ohmann: Treating Desmen Schoffstall/Extender: Sunnie Nielsen Weeks in Treatment: 0 Elijah Mora, Elijah Mora ([id number]) [id number] Page 2 of 4 Primary Learner Assessed: Patient Learning Preferences/Education Level/Primary Language Learning Preference: Explanation Highest Education Level: College or Above Preferred Language: English Cognitive Barrier Language Barrier: No Translator Needed: No Memory Deficit: No Emotional Barrier: No Cultural/Religious Beliefs Affecting Medical Care: No Physical Barrier Impaired Vision: Yes Glasses Impaired Hearing: No Decreased Hand dexterity: No Knowledge/Comprehension Knowledge Level: Medium Comprehension Level: High Ability to understand written instructions: High Ability to understand verbal instructions: High Motivation Anxiety Level: Anxious Cooperation: Cooperative Education Importance: Acknowledges Need Interest in Health Problems: Asks Questions Perception: Coherent Willingness to Engage in Self-Management High Activities: Readiness to Engage in Self-Management High Activities: Electronic Signature(s) Signed: 01/17/2023 4:05:57 PM By: Yevonne Pax RN Entered By: Yevonne Pax on 01/17/2023 09:44:18 -------------------------------------------------------------------------------- Fall Risk Assessment Details Patient Name: Date of Service: Elijah Mora, Elijah Mora. 01/17/2023 9:30 A M Medical Record Number: [id number] Patient Account  Number: [id number] Date of Birth/Sex: Treating RN: 02/26/1971 (51 y.o. [name]) Yevonne Pax Primary Care Yolandra Habig: [name] Other Clinician: Referring Marrah Vanevery: Treating Keyunna Coco/Extender: Sunnie Nielsen Weeks in Treatment: 0 Fall Risk Assessment Items Have you had 2 or more falls in the last 12 monthso 0 No Have you had any fall that resulted in injury in the last 12 monthso 0 No FALLS RISK SCREEN History of falling - immediate or within 3 months 0 No Secondary diagnosis (Do you have 2 or more medical diagnoseso) 0 No Ambulatory  aid None/bed rest/wheelchair/nurse 0 No Crutches/cane/walker 0 No Furniture 0 No Intravenous therapy Access/Saline/Heparin Lock 0 No Gait/Transferring Normal/ bed rest/ wheelchair 0 No Weak (short steps with or without shuffle, stooped but able to lift head while walking, may seek 0 No support from furniture) Impaired (short steps with shuffle, may have difficulty arising from chair, head down, impaired 0 No balance) Mental Status Oriented to own ability 0 No Overestimates or forgets limitations 0 No Risk Level: Low Risk Score: 0 Elijah Mora, Elijah Mora ([id number]) [phone] [id number] Page 3 of 4 Electronic Signature(s) -------------------------------------------------------------------------------- Foot Assessment Details Patient Name: Date of Service: Elijah Mora, Elijah Mora. 01/17/2023 9:30 A M Medical Record Number: [id number] Patient Account Number: [id number] Date of Birth/Sex: Treating RN: [birth year] (51 y.o. [name]) Yevonne Pax Primary Care Rella Egelston: [name] Other Clinician: Referring Aalani Aikens: Treating Jeromey Kruer/Extender: Sunnie Nielsen Weeks in Treatment: 0 Foot Assessment Items Site Locations + = Sensation present, - = Sensation absent, C = Callus, U = Ulcer R = Redness, W = Warmth, M = Maceration, PU = Pre-ulcerative lesion F = Fissure, S = Swelling, D = Dryness Assessment Right:  Left: Other Deformity: No No Prior Foot Ulcer: No No Prior Amputation: No No Charcot Joint: No No Ambulatory Status: Ambulatory Without Help Gait: Steady Electronic Signature(s) Signed: 01/17/2023 4:05:57 PM By: Yevonne Pax RN Entered By: Yevonne Pax on 01/17/2023 09:44:44 -------------------------------------------------------------------------------- Nutrition Risk Screening Details Patient Name: Date of Service: Elijah Mora, Elijah Mora. 01/17/2023 9:30 A M Medical Record Number: [id number] Patient Account Number: [id number] Date of Birth/Sex: Treating RN: 1970-12-04 (51 y.o. [name] Primary Care Randall Colden: [name] Other Clinician: Referring Kendall Justo: Treating Jazzy Parmer/Extender: Sunnie Nielsen Weeks in Treatment: 0 Height (in): 70 Weight (lbs): 247 Body Mass Index (BMI): 35.4 Elijah Mora, Elijah Mora ([id number]) [phone] [id number] Page 4 of 4 Nutrition Risk Screening Items Score Screening NUTRITION RISK SCREEN: I have an illness or condition that made me change the kind and/or amount of food I eat 2 Yes I eat fewer than two meals per day 0 No I eat few fruits and vegetables, or milk products 0 No I have three or more drinks of beer, liquor or wine almost every day 0 No I have tooth or mouth problems that make it hard for me to eat 0 No I don't always have enough money to buy the food I need 0 No I eat alone most of the time 0 No I take three or more different prescribed or over-the-counter drugs a day 1 Yes Without wanting to, I have lost or gained 10 pounds in the last six months 0 No I am not always physically able to shop, cook and/or feed myself 0 No Nutrition Protocols Good Risk Protocol Moderate Risk Protocol 0 Provide education on nutrition High Risk Proctocol Risk Level: Moderate Risk Score: 3 Electronic Signature(s) Signed: 01/17/2023 4:05:57 PM By: Yevonne Pax RN Entered By: Yevonne Pax on 01/17/2023  09:44:36

## 2023-01-18 NOTE — Progress Notes (Signed)
Elijah Mora, Elijah Mora ([id number]) [id number] Page 1 of 8 Visit Report for 01/17/2023 Allergy List Details Patient Name: Date of Service: [name], [name] 01/17/2023 9:30 A M Medical Record Number: [id number] Patient Account Number: [id number] Date of Birth/Sex: Treating RN: 05-18-1971 (51 y.o. [name]) Yevonne Pax Primary Care Murial Beam: [name] Other Clinician: Referring Keiron Iodice: Treating Braelen Sproule/Extender: Sunnie Nielsen Weeks in Treatment: 0 Allergies Active Allergies No Known Allergies Allergy Notes Electronic Signature(s) Signed: 01/17/2023 4:05:57 PM By: Yevonne Pax RN Entered By: Yevonne Pax on 01/17/2023 09:41:20 -------------------------------------------------------------------------------- Arrival Information Details Patient Name: Date of Service: [name], [name] 01/17/2023 9:30 A M Medical Record Number: [id number] Patient Account Number: [id number] Date of Birth/Sex: Treating RN: [birth year] (51 y.o. [name]) Yevonne Pax Primary Care Kanyla Omeara: [name] Other Clinician: Referring Anwyn Kriegel: Treating Olly Shiner/Extender: Sunnie Nielsen Weeks in Treatment: 0 Visit Information Patient Arrived: Ambulatory Arrival Time: 09:39 Accompanied By: self Transfer Assistance: None Patient Identification Verified: Yes Secondary Verification Process Completed: Yes Patient Requires Transmission-Based Precautions: No Patient Has Alerts: No Electronic Signature(s) Signed: 01/17/2023 4:05:57 PM By: Yevonne Pax RN Entered By: Yevonne Pax on 01/17/2023 09:40:17 -------------------------------------------------------------------------------- Clinic Level of Care Assessment Details Patient Name: Date of Service: [name], [name] 01/17/2023 9:30 A M Medical Record Number: [id number] Patient Account Number: [id number] Date of Birth/Sex: Treating RN: 19-Jun-1971 (51 y.o. [name]) Yevonne Pax Primary Care Ceaira Ernster:  [name] Other Clinician: Referring Barnell Shieh: Treating Jeremiah Curci/Extender: Sunnie Nielsen Weeks in Treatment: 0 Clinic Level of Care Assessment Items TOOL 2 Quantity Score X- 1 0 Use when only an EandM is performed on the INITIAL visit ASSESSMENTS - Nursing Assessment / Reassessment X- 1 20 General Physical Exam (combine w/ comprehensive assessment (listed just below) when performed on new pt. evals) X- 1 25 Comprehensive Assessment (HX, ROS, Risk Assessments, Wounds Hx, etc.) Elijah Mora, Elijah Mora ([id number]) [id number] Page 2 of 8 ASSESSMENTS - Wound and Skin A ssessment / Reassessment X - Simple Wound Assessment / Reassessment - one wound 1 5 []  - 0 Complex Wound Assessment / Reassessment - multiple wounds []  - 0 Dermatologic / Skin Assessment (not related to wound area) ASSESSMENTS - Ostomy and/or Continence Assessment and Care []  - 0 Incontinence Assessment and Management []  - 0 Ostomy Care Assessment and Management (repouching, etc.) PROCESS - Coordination of Care X - Simple Patient / Family Education for ongoing care 1 15 []  - 0 Complex (extensive) Patient / Family Education for ongoing care []  - 0 Staff obtains Chiropractor, Records, T Results / Process Orders est []  - 0 Staff telephones HHA, Nursing Homes / Clarify orders / etc []  - 0 Routine Transfer to another Facility (non-emergent condition) []  - 0 Routine Hospital Admission (non-emergent condition) []  - 0 New Admissions / Manufacturing engineer / Ordering NPWT Apligraf, etc. , []  - 0 Emergency Hospital Admission (emergent condition) X- 1 10 Simple Discharge Coordination []  - 0 Complex (extensive) Discharge Coordination PROCESS - Special Needs []  - 0 Pediatric / Minor Patient Management []  - 0 Isolation Patient Management []  - 0 Hearing / Language / Visual special needs []  - 0 Assessment of Community assistance (transportation, D/C planning, etc.) []  -  0 Additional assistance / Altered mentation []  - 0 Support Surface(s) Assessment (bed, cushion, seat, etc.) INTERVENTIONS - Wound Cleansing / Measurement X- 1 5 Wound Imaging (photographs - any number of wounds) []  - 0 Wound Tracing (instead of photographs) X- 1 5 Simple Wound Measurement -  one wound  - 0 Complex Wound Measurement - multiple wounds X- 1 5 Simple Wound Cleansing - one wound  - 0 Complex Wound Cleansing - multiple wounds INTERVENTIONS - Wound Dressings X - Small Wound Dressing one or multiple wounds 1 10  - 0 Medium Wound Dressing one or multiple wounds  - 0 Large Wound Dressing one or multiple wounds  - 0 Application of Medications - injection INTERVENTIONS - Miscellaneous  - 0 External ear exam  - 0 Specimen Collection (cultures, biopsies, blood, body fluids, etc.)  - 0 Specimen(s) / Culture(s) sent or taken to Lab for analysis  - 0 Patient Transfer (multiple staff / Nurse, adult / Similar devices)  - 0 Simple Staple / Suture removal (25 or less)  - 0 Complex Staple / Suture removal (26 or more)  - 0 Hypo / Hyperglycemic Management (close monitor of Blood Glucose) Elijah Mora, Elijah Mora ([id number]) [id number] Page 3 of 8  - 0 Ankle / Brachial Index (ABI) - do not check if billed separately Has the patient been seen at the hospital within the last three years: Yes Total Score: 100 Level Of Care: New/Established - Level 3 Electronic Signature(s) Signed: 01/17/2023 4:05:57 PM By: Yevonne Pax RN Entered By: Yevonne Pax on 01/17/2023 10:14:21 -------------------------------------------------------------------------------- Encounter Discharge Information Details Patient Name: Date of Service: [name], [name] 01/17/2023 9:30 A M Medical Record Number: [id number] Patient Account Number: [id number] Date of Birth/Sex: Treating RN: 25-Jan-1971 (51 y.o. [name]) Yevonne Pax Primary Care [place] Agent: [name] Other Clinician: Referring Nevin Kozuch: Treating Cattie Tineo/Extender: Sunnie Nielsen Weeks in Treatment: 0 Encounter Discharge Information Items Discharge Condition: Stable Ambulatory Status: Ambulatory Discharge Destination: Home Transportation: Private Auto Accompanied By: self Schedule Follow-up Appointment: Yes Clinical Summary of Care: Electronic Signature(s) Signed: 01/17/2023 4:05:57 PM By: Yevonne Pax RN Entered By: Yevonne Pax on 01/17/2023 10:14:56 -------------------------------------------------------------------------------- Lower Extremity Assessment Details Patient Name: Date of Service: [name], [name] 01/17/2023 9:30 A M Medical Record Number: [id number] Patient Account Number: [id number] Date of Birth/Sex: Treating RN: 1971-08-17 (51 y.o. [name]) Yevonne Pax Primary Care Jameal Razzano: [name] Other Clinician: Referring Draven Laine: Treating Brendaly Townsel/Extender: Sunnie Nielsen Weeks in Treatment: 0 Electronic Signature(s) Signed: 01/17/2023 4:05:57 PM By: Yevonne Pax RN Entered By: Yevonne Pax on 01/17/2023 09:51:19 -------------------------------------------------------------------------------- Multi Wound Chart Details Patient Name: Date of Service: [name], [name] 01/17/2023 9:30 A M Medical Record Number: [id number] Patient Account Number: [id number] Date of Birth/Sex: Treating RN: 1971-06-02 (51 y.o. [name]) Yevonne Pax Primary Care Cielle Aguila: [name] Other Clinician: Referring Koden Hunzeker: Treating Dachelle Molzahn/Extender: Sunnie Nielsen Weeks in Treatment: 0 Vital Signs Height(in): 70 Pulse(bpm): 81 Weight(lbs): 247 Blood Pressure(mmHg): 138/80 Body Mass Index(BMI): 35.4 Temperature(F): 98.5 Respiratory Rate(breaths/min): 18 PAYMON, ROSENSTEEL ([id number]) [1:Photos:] [N/A:N/A] Right, Distal Hand - 4th Digit N/A N/A Wound Location: Trauma N/A N/A Wounding Event: Trauma, Other N/A N/A Primary  Etiology: Received Chemotherapy, Received N/A N/A Comorbid History: Radiation 01/12/2023 N/A N/A Date Acquired: 0 N/A N/A Weeks of Treatment: Open N/A N/A Wound Status: No N/A N/A Wound Recurrence: 0.7x0.6x0.1 N/A N/A Measurements L x W x D (cm) 0.33 N/A N/A A (cm) : rea 0.033 N/A N/A Volume (cm) : Partial Thickness N/A N/A Classification: Medium N/A N/A Exudate A mount: Serosanguineous N/A N/A Exudate Type: red, brown N/A N/A Exudate Color: Small (1-33%) N/A N/A Granulation A mount: Pink N/A N/A Granulation Quality: Large (67-100%) N/A N/A Necrotic A mount: Fat Layer (Subcutaneous  Tissue): Yes N/A N/A Exposed Structures: Fascia: No Tendon: No Muscle: No Joint: No Bone: No None N/A N/A Epithelialization: Treatment Notes Electronic Signature(s) Signed: 01/17/2023 4:05:57 PM By: Yevonne Pax RN Entered By: Yevonne Pax on 01/17/2023 09:51:28 -------------------------------------------------------------------------------- Multi-Disciplinary Care Plan Details Patient Name: Date of Service: [name], [name] 01/17/2023 9:30 A M Medical Record Number: [id number] Patient Account Number: [id number] Date of Birth/Sex: Treating RN: [birth year] (51 y.o. [name]) Yevonne Pax Primary Care Nohea Kras: [name] Other Clinician: Referring Revis Whalin: Treating Lilyana Lippman/Extender: Sunnie Nielsen Weeks in Treatment: 0 Active Inactive Necrotic Tissue Nursing Diagnoses: Knowledge deficit related to management of necrotic/devitalized tissue Goals: Necrotic/devitalized tissue will be minimized in the wound bed Date Initiated: 01/17/2023 Target Resolution Date: 02/16/2023 Goal Status: Active Interventions: Assess patient pain level pre-, during and post procedure and prior to discharge Notes: Nutrition Nursing Diagnoses: Potential for alteratiion in Nutrition/Potential for imbalanced nutrition Elijah Mora, Elijah Mora ([id number])  [id number] Page 5 of 8 Goals: Patient/caregiver verbalizes understanding of need to maintain therapeutic glucose control per primary care physician Date Initiated: 01/17/2023 Target Resolution Date: 02/16/2023 Goal Status: Active Interventions: Assess HgA1c results as ordered upon admission and as needed Assess patient nutrition upon admission and as needed per policy Notes: Wound/Skin Impairment Nursing Diagnoses: Knowledge deficit related to ulceration/compromised skin integrity Goals: Patient/caregiver will verbalize understanding of skin care regimen Date Initiated: 01/17/2023 Target Resolution Date: 02/16/2023 Goal Status: Active Ulcer/skin breakdown will have a volume reduction of 30% by week 4 Date Initiated: 01/17/2023 Target Resolution Date: 02/16/2023 Goal Status: Active Ulcer/skin breakdown will have a volume reduction of 50% by week 8 Date Initiated: 01/17/2023 Target Resolution Date: 03/19/2023 Goal Status: Active Ulcer/skin breakdown will have a volume reduction of 80% by week 12 Date Initiated: 01/17/2023 Target Resolution Date: 04/18/2023 Goal Status: Active Ulcer/skin breakdown will heal within 14 weeks Date Initiated: 01/17/2023 Target Resolution Date: 05/19/2023 Goal Status: Active Interventions: Assess patient/caregiver ability to obtain necessary supplies Assess patient/caregiver ability to perform ulcer/skin care regimen upon admission and as needed Assess ulceration(s) every visit Notes: Electronic Signature(s) Signed: 01/17/2023 4:05:57 PM By: Yevonne Pax RN Entered By: Yevonne Pax on 01/17/2023 09:53:02 -------------------------------------------------------------------------------- Pain Assessment Details Patient Name: Date of Service: [name], [name] 01/17/2023 9:30 A M Medical Record Number: [id number] Patient Account Number: [id number] Date of Birth/Sex: Treating RN: Dec 10, 1970 (51 y.o. [name] Primary Care  Dominika Losey: [name] Other Clinician: Referring Claris Pech: Treating Keimari [place]/Extender: Sunnie Nielsen Weeks in Treatment: 0 Active Problems Location of Pain Severity and Description of Pain Patient Has Paino No Site Locations Elijah Mora, Elijah Mora ([id number]) [id number] Page 6 of 8 Pain Management and Medication Current Pain Management: Electronic Signature(s) Signed: 01/17/2023 4:05:57 PM By: Yevonne Pax RN Entered By: Yevonne Pax on 01/17/2023 09:40:25 -------------------------------------------------------------------------------- Patient/Caregiver Education Details Patient Name: Date of Service: [name], BRA NDO N Rexene Edison 4/23/2024andnbsp9:30 A M Medical Record Number: [id number] Patient Account Number: [id number] Date of Birth/Gender: Treating RN: December 25, 1970 (51 y.o. [name] Primary Care Physician: [name] Other Clinician: Referring Physician: Treating Physician/Extender: Sunnie Nielsen Weeks in Treatment: 0 Education Assessment Education Provided To: Patient Education Topics Provided Welcome [facility]New [facility] Packet: o Handouts: Welcome T [facility] o Methods: Explain/Verbal Responses: State content correctly Electronic Signature(s) Signed: 01/17/2023 4:05:57 PM By: Yevonne Pax RN Entered By: Yevonne Pax on 01/17/2023 09:53:16 -------------------------------------------------------------------------------- Wound Assessment Details Patient Name: Date of Service: [name], BRA NDO N  H. 01/17/2023 9:30 A M Medical Record Number: [id number] Patient Account Number: [id number] Date of Birth/Sex: Treating RN: 06-30-1971 (51 y.o. [name]) Yevonne Pax Primary Care Jacole Capley: [name] Other Clinician: Referring Monish Haliburton: Treating Leeza Heiner/Extender: Sunnie Nielsen Weeks in Treatment: 0 Wound Status Wound Number: 1 Primary Etiology: Trauma, Other Wound Location:  Right, Distal Hand - 4th Digit Wound Status: Open Wounding Event: Trauma Comorbid History: Received Chemotherapy, Received Radiation Date Acquired: 01/12/2023 Weeks Of Treatment: 0 Elijah Mora, Elijah Mora ([id number]) [id number] Page 7 of 8 Clustered Wound: No Photos Wound Measurements Length: (cm) 0.7 Width: (cm) 0.6 Depth: (cm) 0.1 Area: (cm) 0.33 Volume: (cm) 0.033 % Reduction in Area: % Reduction in Volume: Epithelialization: None Tunneling: No Undermining: No Wound Description Classification: Partial Thickness Exudate Amount: Medium Exudate Type: Serosanguineous Exudate Color: red, brown Foul Odor After Cleansing: No Slough/Fibrino Yes Wound Bed Granulation Amount: Small (1-33%) Exposed Structure Granulation Quality: Pink Fascia Exposed: No Necrotic Amount: Large (67-100%) Fat Layer (Subcutaneous Tissue) Exposed: Yes Necrotic Quality: Adherent Slough Tendon Exposed: No Muscle Exposed: No Joint Exposed: No Bone Exposed: No Treatment Notes Wound #1 (Hand - 4th Digit) Wound Laterality: Right, Distal Cleanser Peri-Wound Care Topical Primary Dressing Xeroform-HBD 2x2 (in/in) Discharge Instruction: Apply Xeroform-HBD 2x2 (in/in) as directed Secondary Dressing Coverlet Latex-Free Fabric Adhesive Dressings Discharge Instruction: 1.5 x 2 Secured With Compression Wrap Compression Stockings Add-Ons Electronic Signature(s) Signed: 01/17/2023 4:05:57 PM By: Yevonne Pax RN Entered By: Yevonne Pax on 01/17/2023 09:51:05 -------------------------------------------------------------------------------- Vitals Details Patient Name: Date of Service: [name], [name] 01/17/2023 9:30 A M Medical Record Number: [id number] Patient Account Number: [id number] Elijah Mora, Elijah Mora ([id number]) [id number] Page 8 of 8 Date of Birth/Sex: Treating RN: 04-25-1971 (51 y.o. [name]) Yevonne Pax Primary Care Latima Hamza: [name] Other Clinician: Referring Evon Lopezperez: Treating Bradley Bostelman/Extender: Sunnie Nielsen Weeks in Treatment: 0 Vital Signs Time Taken: 09:40 Temperature (F): 98.5 Height (in): 70 Pulse (bpm): 81 Source: Stated Respiratory Rate (breaths/min): 18 Weight (lbs): 247 Blood Pressure (mmHg): 138/80 Source: Stated Reference Range: 80 - 120 mg / dl Body Mass Index (BMI): 35.4 Electronic Signature(s) Signed: 01/17/2023 4:05:57 PM By: Yevonne Pax RN Entered By: Yevonne Pax on 01/17/2023 09:40:55

## 2023-01-18 NOTE — Progress Notes (Signed)
ARIC, JOST ([id number]) [id number] Page 1 of 5 Visit Report for 01/17/2023 Chief Complaint Document Details Patient Name: Date of Service: [name], [name] 01/17/2023 9:30 A M Medical Record Number: [id number] Patient Account Number: [id number] Date of Birth/Sex: Treating RN: 1971/03/13 (52 y.o. [name]) Yevonne Pax Primary Care Provider: [name] Other Clinician: Referring Provider: Treating Provider/Extender: Sunnie Nielsen Weeks in Treatment: 0 Information Obtained from: Patient Chief Complaint Right ring finger laceration Electronic Signature(s) Signed: 01/17/2023 10:11:11 AM By: Allen Derry PA-C Entered By: Allen Derry on 01/17/2023 10:11:11 -------------------------------------------------------------------------------- HPI Details Patient Name: Date of Service: [name], [name] 01/17/2023 9:30 A M Medical Record Number: [id number] Patient Account Number: [id number] Date of Birth/Sex: Treating RN: 07/09/1971 (52 y.o. [name]) Yevonne Pax Primary Care Provider: [name] Other Clinician: Referring Provider: Treating Provider/Extender: Sunnie Nielsen Weeks in Treatment: 0 History of Present Illness HPI Description: 01-17-2023 upon evaluation today patient presents for concerning a wound which is on his hand specifically the fourth digit at the tip that occurred on 01-12-2023 when he was cutting cucumbers for male and in advertently cut through his finger. The good news is there does not appear to be infected and I think he is actually doing well in that regard. I do think however is going to take a little time to let the new epithelial tissue grow. Patient does have diabetes but no other major medical problems. Electronic Signature(s) Signed: 01/17/2023 10:33:19 AM By: Allen Derry PA-C Entered By: Allen Derry on 01/17/2023  10:33:19 -------------------------------------------------------------------------------- Physical Exam Details Patient Name: Date of Service: [name], [name] 01/17/2023 9:30 A M Medical Record Number: [id number] Patient Account Number: [id number] Date of Birth/Sex: Treating RN: 1971/08/03 (52 y.o. [name] Primary Care Provider: [name] Other Clinician: Referring Provider: Treating Provider/Extender: Sunnie Nielsen Weeks in Treatment: 0 Constitutional sitting or standing blood pressure is within target range for patient.. pulse regular and within target range for patient.Marland Kitchen respirations regular, non-labored and within target range for patient.Marland Kitchen temperature within target range for patient.. Well-nourished and well-hydrated in no acute distress. Eyes conjunctiva clear no eyelid edema noted. pupils equal round and reactive to light and accommodation. Ears, Nose, Mouth, and Throat no gross abnormality of ear auricles or external auditory canals. normal hearing noted during conversation. mucus membranes moist. Respiratory normal breathing without difficulty. Elijah Mora, Elijah Mora ([id number]) [id number] Page 2 of 5 Musculoskeletal normal gait and posture. no significant deformity or arthritic changes, no loss or range of motion, no clubbing. Psychiatric this patient is able to make decisions and demonstrates good insight into disease process. Alert and Oriented x 3. pleasant and cooperative. Notes Upon inspection patient's wound bed actually showed signs of good granulation epithelization at this point. Fortunately I do not see any evidence of active infection locally nor systemically which is great news and overall I am extremely pleased with where things stand currently. Electronic Signature(s) Signed: 01/17/2023 10:34:14 AM By: Allen Derry PA-C Entered By: Allen Derry on 01/17/2023  10:34:13 -------------------------------------------------------------------------------- Physician Orders Details Patient Name: Date of Service: [name], [name] 01/17/2023 9:30 A M Medical Record Number: [id number] Patient Account Number: [id number] Date of Birth/Sex: Treating RN: September 14, 1971 (52 y.o. [name] Primary Care Provider: [name] Other Clinician: Referring Provider: Treating Provider/Extender: Sunnie Nielsen Weeks in Treatment: 0 Verbal / Phone Orders: No Diagnosis Coding ICD-10 Coding Code Description [phone] Laceration without foreign body  of right ring finger without damage to nail, initial encounter E11.622 Type 2 diabetes mellitus with other skin ulcer Follow-up Appointments Return Appointment in 1 week. Bathing/ [employer] wounds with antibacterial soap and water. Anesthetic (Use 'Patient Medications' Section for Anesthetic Order Entry) Lidocaine applied to wound bed Wound Treatment Wound #1 - Hand - 4th Digit Wound Laterality: Right, Distal Prim Dressing: Xeroform-HBD 2x2 (in/in) 1 x Per Day/30 Days ary Discharge Instructions: Apply Xeroform-HBD 2x2 (in/in) as directed Secondary Dressing: Coverlet Latex-Free Fabric Adhesive Dressings 1 x Per Day/30 Days Discharge Instructions: 1.5 x 2 Electronic Signature(s) Signed: 01/17/2023 11:50:37 AM By: Allen Derry PA-C Signed: 01/17/2023 4:05:57 PM By: Yevonne Pax RN Entered By: Yevonne Pax on 01/17/2023 10:13:48 -------------------------------------------------------------------------------- Problem List Details Patient Name: Date of Service: [name], [name] 01/17/2023 9:30 A M Medical Record Number: [id number] Patient Account Number: [id number] Date of Birth/Sex: Treating RN: 1971/08/18 (52 y.o. [name] Primary Care Provider: [name] Other Clinician: Referring Provider: Treating Provider/Extender: Sunnie Nielsen Weeks in Treatment:  [address] [name], [name] [id number] Page 3 of 5 ICD-10 Encounter Code Description Active Date MDM Diagnosis S61.214A Laceration without foreign body of right ring finger without damage to nail, 01/17/2023 No Yes initial encounter E11.622 Type 2 diabetes mellitus with other skin ulcer 01/17/2023 No Yes Inactive Problems Resolved Problems Electronic Signature(s) Signed: 01/17/2023 10:10:54 AM By: Allen Derry PA-C Entered By: Allen Derry on 01/17/2023 10:10:53 -------------------------------------------------------------------------------- Progress Note Details Patient Name: Date of Service: [name], [name] 01/17/2023 9:30 A M Medical Record Number: [id number] Patient Account Number: [id number] Date of Birth/Sex: Treating RN: 09/24/1971 (51 y.o. [name]) Yevonne Pax Primary Care Provider: [name] Other Clinician: Referring Provider: Treating Provider/Extender: Sunnie Nielsen Weeks in Treatment: 0 Subjective Chief Complaint Information obtained from Patient Right ring finger laceration History of Present Illness (HPI) 01-17-2023 upon evaluation today patient presents for concerning a wound which is on his hand specifically the fourth digit at the tip that occurred on 01-12-2023 when he was cutting cucumbers for male and in advertently cut through his finger. The good news is there does not appear to be infected and I think he is actually doing well in that regard. I do think however is going to take a little time to let the new epithelial tissue grow. Patient does have diabetes but no other major medical problems. Patient History Allergies No Known Allergies Social History Never smoker, Marital Status - Married, Alcohol Use - Moderate, Drug Use - No History, Caffeine Use - Daily. Medical History Oncologic Patient has history of Received Chemotherapy - 1999, Received Radiation - 1999 Review of Systems  (ROS) Integumentary (Skin) Complains or has symptoms of Wounds. Objective Constitutional sitting or standing blood pressure is within target range for patient.. pulse regular and within target range for patient.Marland Kitchen respirations regular, non-labored and within target range for patient.Marland Kitchen temperature within target range for patient.. Well-nourished and well-hydrated in no acute distress. Vitals Time Taken: 9:40 AM, Height: 70 in, Source: Stated, Weight: 247 lbs, Source: Stated, BMI: 35.4, Temperature: 98.5 F, Pulse: 81 bpm, Respiratory Rate: 18 breaths/min, Blood Pressure: 138/80 mmHg. Elijah Mora, Elijah Mora ([id number]) [id number] Page 4 of 5 Eyes conjunctiva clear no eyelid edema noted. pupils equal round and reactive to light and accommodation. Ears, Nose, Mouth, and Throat no gross abnormality of ear auricles or external auditory canals. normal hearing noted during conversation. mucus membranes moist. Respiratory normal breathing without difficulty. Musculoskeletal  normal gait and posture. no significant deformity or arthritic changes, no loss or range of motion, no clubbing. Psychiatric this patient is able to make decisions and demonstrates good insight into disease process. Alert and Oriented x 3. pleasant and cooperative. General Notes: Upon inspection patient's wound bed actually showed signs of good granulation epithelization at this point. Fortunately I do not see any evidence of active infection locally nor systemically which is great news and overall I am extremely pleased with where things stand currently. Integumentary (Hair, Skin) Wound #1 status is Open. Original cause of wound was Trauma. The date acquired was: 01/12/2023. The wound is located on the Right,Distal Hand - 4th Digit. The wound measures 0.7cm length x 0.6cm width x 0.1cm depth; 0.33cm^2 area and 0.033cm^3 volume. There is Fat Layer (Subcutaneous Tissue) exposed. There is no tunneling or  undermining noted. There is a medium amount of serosanguineous drainage noted. There is small (1-33%) pink granulation within the wound bed. There is a large (67-100%) amount of necrotic tissue within the wound bed including Adherent Slough. Assessment Active Problems ICD-10 Laceration without foreign body of right ring finger without damage to nail, initial encounter Type 2 diabetes mellitus with other skin ulcer Plan Follow-up Appointments: Return Appointment in 1 week. Bathing/ Shower/ Hygiene: Wash wounds with antibacterial soap and water. Anesthetic (Use 'Patient Medications' Section for Anesthetic Order Entry): Lidocaine applied to wound bed WOUND #1: - Hand - 4th Digit Wound Laterality: Right, Distal Prim Dressing: Xeroform-HBD 2x2 (in/in) 1 x Per Day/30 Days ary Discharge Instructions: Apply Xeroform-HBD 2x2 (in/in) as directed Secondary Dressing: Coverlet Latex-Free Fabric Adhesive Dressings 1 x Per Day/30 Days Discharge Instructions: 1.5 x 2 1. I would recommend that we have the patient continue to monitor for any signs of infection or worsening. Based on what I am seeing I definitely believe that he is making good progress. I do not see anything that it seems to indicate he is going to require any aggressive wound care at this point I think really Xeroform gauze dressing probably is the best way to go there is no need for sharp debridement and in general I think that he is really doing quite well. 2. I am also can recommend that the patient should continue to monitor for any signs of infection or worsening she will go use the Xeroform which should help to keep this clean and dry. Will see how things do over the next week he should be changing this once a day using a finger Band-Aid. We will see patient back for reevaluation in 1 week here in the clinic. If anything worsens or changes patient will contact our office for additional recommendations. Electronic Signature(s) Signed:  01/17/2023 10:34:34 AM By: Allen Derry PA-C Entered By: Allen Derry on 01/17/2023 10:34:34 -------------------------------------------------------------------------------- ROS/PFSH Details Patient Name: Date of Service: [name], [name] 01/17/2023 9:30 A M Medical Record Number: [id number] Patient Account Number: [id number] Date of Birth/Sex: Treating RN: Feb 07, 1971 (51 y.o. [name] Primary Care Provider: [name] Other Clinician: Referring Provider: Treating Provider/Extender: Sunnie Nielsen Weeks in Treatment: [address] Elijah Mora, Elijah Mora ([id number]) [id number] Page 5 of 5 Integumentary (Skin) Complaints and Symptoms: Positive for: Wounds Oncologic Medical History: Positive for: Received Chemotherapy - 1999; Received Radiation - 1999 Immunizations Pneumococcal Vaccine: Received Pneumococcal Vaccination: No Implantable Devices None Family and Social History Never smoker; Marital Status - Married; Alcohol Use: Moderate; Drug Use: No History; Caffeine Use: Daily Electronic Signature(s) Signed: 01/17/2023 11:50:37 AM By:  Allen Derry PA-C Signed: 01/17/2023 4:05:57 PM By: Yevonne Pax RN Entered By: Yevonne Pax on 01/17/2023 09:42:51 -------------------------------------------------------------------------------- SuperBill Details Patient Name: Date of Service: [name], [name] 01/17/2023 Medical Record Number: [id number] Patient Account Number: [id number] Date of Birth/Sex: Treating RN: [birth year] (51 y.o. [name]) Yevonne Pax Primary Care Provider: [name] Other Clinician: Referring Provider: Treating Provider/Extender: Sunnie Nielsen Weeks in Treatment: 0 Diagnosis Coding ICD-10 Codes Code Description [phone] Laceration without foreign body of right ring finger without damage to nail, initial encounter E11.622 Type 2 diabetes mellitus with other skin ulcer Facility Procedures : CPT4 Code:  [id number] Description: 99213 - WOUND CARE VISIT-LEV 3 EST PT Modifier: Quantity: 1 Physician Procedures : CPT4 Code Description Modifier [id number] - WC PHYS LEVEL 3 - EST PT ICD-10 Diagnosis Description S61.214A Laceration without foreign body of right ring finger without damage to nail, initial encounter E11.622 Type 2 diabetes mellitus with other  skin ulcer Quantity: 1 Electronic Signature(s) Signed: 01/17/2023 10:34:54 AM By: Allen Derry PA-C Entered By: Allen Derry on 01/17/2023 10:34:54

## 2023-01-21 ENCOUNTER — Other Ambulatory Visit: Payer: Self-pay | Admitting: Family Medicine

## 2023-01-21 DIAGNOSIS — E781 Pure hyperglyceridemia: Secondary | ICD-10-CM

## 2023-01-23 ENCOUNTER — Encounter: Payer: Self-pay | Admitting: Physician Assistant

## 2023-01-24 ENCOUNTER — Encounter: Payer: BC Managed Care – PPO | Admitting: Physician Assistant

## 2023-01-24 DIAGNOSIS — W458XXA Other foreign body or object entering through skin, initial encounter: Secondary | ICD-10-CM | POA: Diagnosis not present

## 2023-01-24 DIAGNOSIS — Y93G1 Activity, food preparation and clean up: Secondary | ICD-10-CM | POA: Diagnosis not present

## 2023-01-24 DIAGNOSIS — E119 Type 2 diabetes mellitus without complications: Secondary | ICD-10-CM | POA: Diagnosis not present

## 2023-01-24 DIAGNOSIS — S61214A Laceration without foreign body of right ring finger without damage to nail, initial encounter: Secondary | ICD-10-CM | POA: Diagnosis not present

## 2023-01-25 NOTE — Progress Notes (Addendum)
Elijah, Mora ([id number]) [id number] Page 1 of 4 Visit Report for 01/24/2023 Chief Complaint Document Details Patient Name: Date of Service: Elijah RNHA RDT, [name] 01/24/2023 3:15 PM Medical Record Number: [id number] Patient Account Number: [id number] Date of Birth/Sex: Treating RN: 1971/09/18 (51 y.o. Elijah Mora) Elijah Mora Primary Care Provider: Mila Mora Other Clinician: Referring Provider: Treating Provider/Extender: Elijah Mora in Treatment: 1 Information Obtained from: Patient Chief Complaint Right ring finger laceration Electronic Signature(s) Signed: 01/24/2023 3:28:10 PM By: Elijah Derry PA-C Entered By: Elijah Mora on 01/24/2023 15:28:10 -------------------------------------------------------------------------------- HPI Details Patient Name: Date of Service: Elijah RNHA RDT, [name] 01/24/2023 3:15 PM Medical Record Number: [id number] Patient Account Number: [id number] Date of Birth/Sex: Treating RN: 1971-05-23 (51 y.o. Elijah Mora) Elijah Mora Primary Care Provider: Mila Mora Other Clinician: Referring Provider: Treating Provider/Extender: Elijah Mora in Treatment: 1 History of Present Illness HPI Description: 01-17-2023 upon evaluation today patient presents for concerning a wound which is on his hand specifically the fourth digit at the tip that occurred on 01-12-2023 when he was cutting cucumbers for a salad and inadvertently cut through his finger. The good news is there does not appear to be infected and I think he is actually doing well in that regard. I do think however is going to take a little time to let the new epithelial tissue grow. Patient does have diabetes but no other major medical problems. 01-24-2023 upon evaluation today patient's finger actually appears to be doing excellent. Fortunately I do not see any signs of active infection locally nor systemically which is great news. No  fevers, chills, nausea, vomiting, or diarrhea. Electronic Signature(s) Signed: 01/24/2023 5:43:13 PM By: Elijah Derry PA-C Entered By: Elijah Mora on 01/24/2023 17:43:12 -------------------------------------------------------------------------------- Physical Exam Details Patient Name: Date of Service: Elijah RNHA RDT, [name] 01/24/2023 3:15 PM Medical Record Number: [id number] Patient Account Number: [id number] Date of Birth/Sex: Treating RN: Dec 14, 1970 (51 y.o. [name] Primary Care Provider: Mila Mora Other Clinician: Referring Provider: Treating Provider/Extender: Elijah Mora in Treatment: 1 Constitutional Well-nourished and well-hydrated in no acute distress. Respiratory normal breathing without difficulty. Psychiatric this patient is able to make decisions and demonstrates good insight into disease process. Alert and Oriented x 3. pleasant and cooperative. JC, Elijah Mora ([id number]) [id number] Page 2 of 4 Notes Patient's wound bed actually looks like it might be almost completely healed although not 100% I do believe that removing in the right direction here. In general I think that the patient is close to complete closure Electronic Signature(s) Signed: 01/24/2023 5:43:40 PM By: Elijah Derry PA-C Entered By: Elijah Mora on 01/24/2023 17:43:39 -------------------------------------------------------------------------------- Physician Orders Details Patient Name: Date of Service: Elijah RNHA RDT, [name] 01/24/2023 3:15 PM Medical Record Number: [id number] Patient Account Number: [id number] Date of Birth/Sex: Treating RN: 1971/07/19 (51 y.o. Elijah Mora) Elijah Mora Primary Care Provider: Mila Mora Other Clinician: Referring Provider: Treating Provider/Extender: Elijah Mora in Treatment: 1 Verbal / Phone Orders: No Diagnosis Coding ICD-10 Coding Code Description [phone] Laceration without  foreign body of right ring finger without damage to nail, initial encounter E11.622 Type 2 diabetes mellitus with other skin ulcer Follow-up Appointments Return Appointment in 1 week. Bathing/ [employer] wounds with antibacterial soap and water. Anesthetic (Use 'Patient Medications' Section for Anesthetic Order Entry) Lidocaine applied to wound bed Wound Treatment Wound #1 - Hand - 4th Digit Wound Laterality: Right, Distal Prim Dressing: Xeroform-HBD  2x2 (in/in) 1 x Per Day/30 Days ary Discharge Instructions: Apply Xeroform-HBD 2x2 (in/in) as directed Secondary Dressing: Coverlet Latex-Free Fabric Adhesive Dressings 1 x Per Day/30 Days Discharge Instructions: 1.5 x 2 Electronic Signature(s) Signed: 01/24/2023 4:11:38 PM By: Elijah Pax RN Signed: 01/24/2023 6:18:39 PM By: Elijah Derry PA-C Entered By: Elijah Mora on 01/24/2023 16:11:37 -------------------------------------------------------------------------------- Problem List Details Patient Name: Date of Service: Elijah RNHA RDT, [name] 01/24/2023 3:15 PM Medical Record Number: [id number] Patient Account Number: [id number] Date of Birth/Sex: Treating RN: January 07, 1971 (51 y.o. [name] Primary Care Provider: Mila Mora Other Clinician: Referring Provider: Treating Provider/Extender: Elijah Mora in Treatment: 1 Active Problems ICD-10 Encounter Code Description Active Date MDM Diagnosis [phone] Laceration without foreign body of right ring finger without damage to nail, 01/17/2023 No Yes initial encounter Elijah Mora ([id number]) [id number] Page 3 of 4 E11.622 Type 2 diabetes mellitus with other skin ulcer 01/17/2023 No Yes Inactive Problems Resolved Problems Electronic Signature(s) Signed: 01/24/2023 3:28:05 PM By: Elijah Derry PA-C Entered By: Elijah Mora on 01/24/2023  15:28:04 -------------------------------------------------------------------------------- Progress Note Details Patient Name: Date of Service: Elijah RNHA RDT, [name] 01/24/2023 3:15 PM Medical Record Number: [id number] Patient Account Number: [id number] Date of Birth/Sex: Treating RN: 09-21-1971 (51 y.o. Elijah Mora) Elijah Mora Primary Care Provider: Mila Mora Other Clinician: Referring Provider: Treating Provider/Extender: Elijah Mora in Treatment: 1 Subjective Chief Complaint Information obtained from Patient Right ring finger laceration History of Present Illness (HPI) 01-17-2023 upon evaluation today patient presents for concerning a wound which is on his hand specifically the fourth digit at the tip that occurred on 01-12-2023 when he was cutting cucumbers for a salad and inadvertently cut through his finger. The good news is there does not appear to be infected and I think he is actually doing well in that regard. I do think however is going to take a little time to let the new epithelial tissue grow. Patient does have diabetes but no other major medical problems. 01-24-2023 upon evaluation today patient's finger actually appears to be doing excellent. Fortunately I do not see any signs of active infection locally nor systemically which is great news. No fevers, chills, nausea, vomiting, or diarrhea. Objective Constitutional Well-nourished and well-hydrated in no acute distress. Vitals Time Taken: 3:25 PM, Height: 70 in, Weight: 247 lbs, BMI: 35.4, Temperature: 98.2 F, Pulse: 83 bpm, Respiratory Rate: 18 breaths/min, Blood Pressure: 141/89 mmHg. Respiratory normal breathing without difficulty. Psychiatric this patient is able to make decisions and demonstrates good insight into disease process. Alert and Oriented x 3. pleasant and cooperative. General Notes: Patient's wound bed actually looks like it might be almost completely healed although not 100% I do  believe that removing in the right direction here. In general I think that the patient is close to complete closure Integumentary (Hair, Skin) Wound #1 status is Open. Original cause of wound was Trauma. The date acquired was: 01/12/2023. The wound has been in treatment 1 weeks. The wound is located on the Right,Distal Hand - 4th Digit. The wound measures 0.5cm length x 0.5cm width x 0.1cm depth; 0.196cm^2 area and 0.02cm^3 volume. There is Fat Layer (Subcutaneous Tissue) exposed. There is no tunneling or undermining noted. There is a medium amount of serosanguineous drainage noted. There is small (1-33%) pink granulation within the wound bed. There is a large (67-100%) amount of necrotic tissue within the wound bed including Adherent Slough. Assessment Elijah, Mora ([id number]) [id number] Page 4  of 4 Active Problems ICD-10 Laceration without foreign body of right ring finger without damage to nail, initial encounter Type 2 diabetes mellitus with other skin ulcer Plan Follow-up Appointments: Return Appointment in 1 week. Bathing/ Shower/ Hygiene: Wash wounds with antibacterial soap and water. Anesthetic (Use 'Patient Medications' Section for Anesthetic Order Entry): Lidocaine applied to wound bed WOUND #1: - Hand - 4th Digit Wound Laterality: Right, Distal Prim Dressing: Xeroform-HBD 2x2 (in/in) 1 x Per Day/30 Days ary Discharge Instructions: Apply Xeroform-HBD 2x2 (in/in) as directed Secondary Dressing: Coverlet Latex-Free Fabric Adhesive Dressings 1 x Per Day/30 Days Discharge Instructions: 1.5 x 2 1. Based on what I am seeing I do believe that we are moving in the right direction. I do believe that the patient should continue to use the Xeroform gauze dressing which I think is really doing a good job here. 2. Patient should continue to keep this clean and dry is much as possible. I would say the more that he can do the better and again I am thinking  he may actually be healed by next week. We will see patient back for reevaluation in 1 week here in the clinic. If anything worsens or changes patient will contact our office for additional recommendations. Electronic Signature(s) Signed: 01/24/2023 5:44:28 PM By: Elijah Derry PA-C Entered By: Elijah Mora on 01/24/2023 17:44:27 -------------------------------------------------------------------------------- SuperBill Details Patient Name: Date of Service: Elijah RNHA RDT, [name] 01/24/2023 Medical Record Number: [id number] Patient Account Number: [id number] Date of Birth/Sex: Treating RN: [birth year] (51 y.o. Elijah Mora) Elijah Mora Primary Care Provider: Mila Mora Other Clinician: Referring Provider: Treating Provider/Extender: Elijah Mora in Treatment: 1 Diagnosis Coding ICD-10 Codes Code Description [phone] Laceration without foreign body of right ring finger without damage to nail, initial encounter E11.622 Type 2 diabetes mellitus with other skin ulcer Facility Procedures : CPT4 Code: [id number] Description: [phone] - WOUND CARE VISIT-LEV 2 EST PT Modifier: Quantity: 1 Physician Procedures : CPT4 Code Description Modifier [id number] - WC PHYS LEVEL 3 - EST PT ICD-10 Diagnosis Description S61.214A Laceration without foreign body of right ring finger without damage to nail, initial encounter E11.622 Type 2 diabetes mellitus with other  skin ulcer Quantity: 1 Electronic Signature(s) Signed: 01/24/2023 5:45:37 PM By: Elijah Derry PA-C Previous Signature: 01/24/2023 4:12:10 PM Version By: Elijah Pax RN Entered By: Elijah Mora on 01/24/2023 17:45:37

## 2023-01-26 NOTE — Progress Notes (Signed)
Elijah Mora ([id number]) [id number] Page 1 of 7 Visit Report for 01/24/2023 Arrival Information Details Patient Name: Date of Service: [name], [name] 01/24/2023 3:15 PM Medical Record Number: [id number] Patient Account Number: [id number] Date of Birth/Sex: Treating RN: [birth year] (51 y.o. [name]) Yevonne Pax Primary Care Chen Holzman: [name] Other Clinician: Referring Tynika Luddy: Treating [name] in Treatment: 1 Visit Information History Since Last Visit Added or deleted any medications: No Patient Arrived: Ambulatory Any new allergies or adverse reactions: No Arrival Time: 15:22 Had a fall or experienced change in No Accompanied By: self activities of daily living that may affect Transfer Assistance: None risk of falls: Patient Identification Verified: Yes Signs or symptoms of abuse/neglect since last visito No Secondary Verification Process Completed: Yes Hospitalized since last visit: No Patient Requires Transmission-Based Precautions: No Implantable device outside of the clinic excluding No Patient Has Alerts: No cellular tissue based products placed in the center since last visit: Has Dressing in Place as Prescribed: Yes Pain Present Now: No Electronic Signature(s) Signed: 01/25/2023 9:40:32 AM By: Yevonne Pax RN Entered By: Yevonne Pax on 01/24/2023 15:25:37 -------------------------------------------------------------------------------- Clinic Level of Care Assessment Details Patient Name: Date of Service: [name], [name] 01/24/2023 3:15 PM Medical Record Number: [id number] Patient Account Number: [id number] Date of Birth/Sex: Treating RN: [birth year] (51 y.o. [name]) Yevonne Pax Primary Care Wonda Goodgame: [name] Other Clinician: Referring Charron Coultas: Treating Dahlia Nifong/Extender: Reinaldo Raddle in Treatment: 1 Clinic Level of Care Assessment Items TOOL 4  Quantity Score X- 1 0 Use when only an EandM is performed on FOLLOW-UP visit ASSESSMENTS - Nursing Assessment / Reassessment X- 1 10 Reassessment of Co-morbidities (includes updates in patient status) X- 1 5 Reassessment of Adherence to Treatment Plan ASSESSMENTS - Wound and Skin A ssessment / Reassessment X - Simple Wound Assessment / Reassessment - one wound 1 5 []  - 0 Complex Wound Assessment / Reassessment - multiple wounds []  - 0 Dermatologic / Skin Assessment (not related to wound area) ASSESSMENTS - Focused Assessment []  - 0 Circumferential Edema Measurements - multi extremities []  - 0 Nutritional Assessment / Counseling / Intervention []  - 0 Lower Extremity Assessment (monofilament, tuning fork, pulses) []  - 0 Peripheral Arterial Disease Assessment (using hand held doppler) ASSESSMENTS - Ostomy and/or Continence Assessment and Care []  - 0 Incontinence Assessment and Management []  - 0 Ostomy Care Assessment and Management (repouching, etc.) PROCESS - Coordination of Care X - Simple Patient / Family Education for ongoing care 1 15 Elijah Mora, Elijah Mora ([id number]) [phone].pdf Page 2 of 7 []  - 0 Complex (extensive) Patient / Family Education for ongoing care []  - 0 Staff obtains Chiropractor, Records, T Results / Process Orders est []  - 0 Staff telephones HHA, Nursing Homes / Clarify orders / etc []  - 0 Routine Transfer to another Facility (non-emergent condition) []  - 0 Routine Hospital Admission (non-emergent condition) []  - 0 New Admissions / Manufacturing engineer / Ordering NPWT Apligraf, etc. , []  - 0 Emergency Hospital Admission (emergent condition) X- 1 10 Simple Discharge Coordination []  - 0 Complex (extensive) Discharge Coordination PROCESS - Special Needs []  - 0 Pediatric / Minor Patient Management []  - 0 Isolation Patient Management []  - 0 Hearing / Language / Visual special needs []  - 0 Assessment of Community  assistance (transportation, D/C planning, etc.) []  - 0 Additional assistance / Altered mentation []  - 0 Support Surface(s) Assessment (bed, cushion, seat, etc.) INTERVENTIONS - Wound Cleansing / Measurement  X - Simple Wound Cleansing - one wound 1 5 []  - 0 Complex Wound Cleansing - multiple wounds X- 1 5 Wound Imaging (photographs - any number of wounds) []  - 0 Wound Tracing (instead of photographs) X- 1 5 Simple Wound Measurement - one wound []  - 0 Complex Wound Measurement - multiple wounds INTERVENTIONS - Wound Dressings X - Small Wound Dressing one or multiple wounds 1 10 []  - 0 Medium Wound Dressing one or multiple wounds []  - 0 Large Wound Dressing one or multiple wounds []  - 0 Application of Medications - topical []  - 0 Application of Medications - injection INTERVENTIONS - Miscellaneous []  - 0 External ear exam []  - 0 Specimen Collection (cultures, biopsies, blood, body fluids, etc.) []  - 0 Specimen(s) / Culture(s) sent or taken to Lab for analysis []  - 0 Patient Transfer (multiple staff / Nurse, adult / Similar devices) []  - 0 Simple Staple / Suture removal (25 or less) []  - 0 Complex Staple / Suture removal (26 or more) []  - 0 Hypo / Hyperglycemic Management (close monitor of Blood Glucose) []  - 0 Ankle / Brachial Index (ABI) - do not check if billed separately X- 1 5 Vital Signs Has the patient been seen at the hospital within the last three years: Yes Total Score: 75 Level Of Care: New/Established - Level 2 Electronic Signature(s) Signed: 01/25/2023 9:40:32 AM By: Yevonne Pax RN Entered By: Yevonne Pax on 01/24/2023 16:12:05 Elijah Mora ([id number]) [id number] Page 3 of 7 -------------------------------------------------------------------------------- Encounter Discharge Information Details Patient Name: Date of Service: [name], [name] 01/24/2023 3:15 PM Medical Record Number: [id number] Patient Account  Number: [id number] Date of Birth/Sex: Treating RN: 1971-09-17 (51 y.o. [name]) Yevonne Pax Primary Care Mohd. Derflinger: [name] Other Clinician: Referring Remy Voiles: Treating Cordarryl Monrreal/Extender: Reinaldo Raddle in Treatment: 1 Encounter Discharge Information Items Discharge Condition: Stable Ambulatory Status: Ambulatory Discharge Destination: Home Transportation: Private Auto Accompanied By: self Schedule Follow-up Appointment: Yes Clinical Summary of Care: Electronic Signature(s) Signed: 01/24/2023 4:13:04 PM By: Yevonne Pax RN Entered By: Yevonne Pax on 01/24/2023 16:13:04 -------------------------------------------------------------------------------- Lower Extremity Assessment Details Patient Name: Date of Service: [name], [name] 01/24/2023 3:15 PM Medical Record Number: [id number] Patient Account Number: [id number] Date of Birth/Sex: Treating RN: 02/15/1971 (51 y.o. [name]) Yevonne Pax Primary Care Renny Gunnarson: [name] Other Clinician: Referring Kisean Rollo: Treating Gelsey Amyx/Extender: Reinaldo Raddle in Treatment: 1 Electronic Signature(s) Signed: 01/25/2023 9:40:32 AM By: Yevonne Pax RN Entered By: Yevonne Pax on 01/24/2023 15:29:30 -------------------------------------------------------------------------------- Multi Wound Chart Details Patient Name: Date of Service: [name], [name] 01/24/2023 3:15 PM Medical Record Number: [id number] Patient Account Number: [id number] Date of Birth/Sex: Treating RN: 1971/08/26 (51 y.o. [name]) Yevonne Pax Primary Care Nasire Reali: [name] Other Clinician: Referring Babette Stum: Treating Rusti Arizmendi/Extender: Reinaldo Raddle in Treatment: 1 Vital Signs Height(in): 70 Pulse(bpm): 83 Weight(lbs): 247 Blood Pressure(mmHg): 141/89 Body Mass Index(BMI): 35.4 Temperature(F): 98.2 Respiratory Rate(breaths/min): 18 [1:Photos:] [N/A:N/A] Elijah Mora, Elijah Mora  ([id number]) [1:Right, Distal Hand - 4th Digit Wound Location: Trauma Wounding Event: Trauma, Other Primary Etiology: Received Chemotherapy, Received N/A Comorbid History: Radiation 01/12/2023 Date Acquired: 1 Weeks of Treatment: Open Wound Status: No Wound  Recurrence: 0.5x0.5x0.1 Measurements L x W x D (cm) 0.196 A (cm) : rea 0.02 Volume (cm) : 40.60% % Reduction in A rea: 39.40% % Reduction in Volume: Partial Thickness Classification: Medium Exudate A mount: Serosanguineous Exudate Type: red, brown  Exudate Color: Small (  1-33%) Granulation A mount: Pink Granulation Quality: Large (67-100%) Necrotic A mount: Fat Layer (Subcutaneous Tissue): Yes N/A Exposed Structures: Fascia: No Tendon: No Muscle: No Joint: No Bone: No None Epithelialization:]  [N/A:N/A N/A N/A N/A N/A N/A N/A N/A N/A N/A N/A N/A N/A N/A N/A N/A N/A N/A N/A N/A] Treatment Notes Electronic Signature(s) Signed: 01/25/2023 9:40:32 AM By: Yevonne Pax RN Entered By: Yevonne Pax on 01/24/2023 15:29:35 -------------------------------------------------------------------------------- Multi-Disciplinary Care Plan Details Patient Name: Date of Service: [name], [name] 01/24/2023 3:15 PM Medical Record Number: [id number] Patient Account Number: [id number] Date of Birth/Sex: Treating RN: January 11, 1971 (51 y.o. [name]) Yevonne Pax Primary Care Ellia Knowlton: [name] Other Clinician: Referring Melodi Happel: Treating Laurieann Friddle/Extender: Reinaldo Raddle in Treatment: 1 Active Inactive Necrotic Tissue Nursing Diagnoses: Knowledge deficit related to management of necrotic/devitalized tissue Goals: Necrotic/devitalized tissue will be minimized in the wound bed Date Initiated: 01/17/2023 Target Resolution Date: 02/16/2023 Goal Status: Active Interventions: Assess patient pain level pre-, during and post procedure and prior to discharge Notes: Nutrition Nursing Diagnoses: Potential for alteratiion in Nutrition/Potential  for imbalanced nutrition Goals: Patient/caregiver verbalizes understanding of need to maintain therapeutic glucose control per primary care physician Date Initiated: 01/17/2023 Target Resolution Date: 02/16/2023 Goal Status: Active Interventions: Assess HgA1c results as ordered upon admission and as needed Elijah Mora, Elijah Mora ([id number]) [id number] Page 5 of 7 Assess patient nutrition upon admission and as needed per policy Notes: Wound/Skin Impairment Nursing Diagnoses: Knowledge deficit related to ulceration/compromised skin integrity Goals: Patient/caregiver will verbalize understanding of skin care regimen Date Initiated: 01/17/2023 Target Resolution Date: 02/16/2023 Goal Status: Active Ulcer/skin breakdown will have a volume reduction of 30% by week 4 Date Initiated: 01/17/2023 Target Resolution Date: 02/16/2023 Goal Status: Active Ulcer/skin breakdown will have a volume reduction of 50% by week 8 Date Initiated: 01/17/2023 Target Resolution Date: 03/19/2023 Goal Status: Active Ulcer/skin breakdown will have a volume reduction of 80% by week 12 Date Initiated: 01/17/2023 Target Resolution Date: 04/18/2023 Goal Status: Active Ulcer/skin breakdown will heal within 14 weeks Date Initiated: 01/17/2023 Target Resolution Date: 05/19/2023 Goal Status: Active Interventions: Assess patient/caregiver ability to obtain necessary supplies Assess patient/caregiver ability to perform ulcer/skin care regimen upon admission and as needed Assess ulceration(s) every visit Notes: Electronic Signature(s) Signed: 01/25/2023 9:40:32 AM By: Yevonne Pax RN Entered By: Yevonne Pax on 01/24/2023 15:30:00 -------------------------------------------------------------------------------- Pain Assessment Details Patient Name: Date of Service: [name], [name] 01/24/2023 3:15 PM Medical Record Number: [id number] Patient Account Number: [id number] Date of Birth/Sex:  Treating RN: 1970-10-21 (51 y.o. [name] Primary Care Tashauna Caisse: [name] Other Clinician: Referring Marsden Zaino: Treating Ilayda Toda/Extender: Reinaldo Raddle in Treatment: 1 Active Problems Location of Pain Severity and Description of Pain Patient Has Paino No Site Locations Pain Management and Medication Current Pain Management: Elijah Mora, Elijah Mora ([id number]) [id number] Page 6 of 7 Electronic Signature(s) Signed: 01/25/2023 9:40:32 AM By: Yevonne Pax RN Entered By: Yevonne Pax on 01/24/2023 15:26:43 -------------------------------------------------------------------------------- Patient/Caregiver Education Details Patient Name: Date of Service: [name], BRA NDO N Rexene Edison 4/30/2024andnbsp3:15 PM Medical Record Number: [id number] Patient Account Number: [id number] Date of Birth/Gender: Treating RN: 1971/01/06 (51 y.o. [name] Primary Care Physician: [name] Other Clinician: Referring Physician: Treating Physician/Extender: Reinaldo Raddle in Treatment: 1 Education Assessment Education Provided To: Patient Education Topics Provided Wound/Skin Impairment: Handouts: Caring for Your Ulcer Methods: Explain/Verbal Responses: State content correctly Electronic Signature(s) Signed: 01/25/2023 9:40:32 AM By: Yevonne Pax RN Entered By:  Yevonne Pax on 01/24/2023 15:30:18 -------------------------------------------------------------------------------- Wound Assessment Details Patient Name: Date of Service: [name], [name] 01/24/2023 3:15 PM Medical Record Number: [id number] Patient Account Number: [id number] Date of Birth/Sex: Treating RN: Apr 05, 1971 (51 y.o. [name]) Yevonne Pax Primary Care Xuan Mateus: [name] Other Clinician: Referring Kristine Chahal: Treating Eric Nees/Extender: Reinaldo Raddle in Treatment: 1 Wound Status Wound Number: 1 Primary Etiology: Trauma,  Other Wound Location: Right, Distal Hand - 4th Digit Wound Status: Open Wounding Event: Trauma Comorbid History: Received Chemotherapy, Received Radiation Date Acquired: 01/12/2023 Weeks Of Treatment: 1 Clustered Wound: No Photos Wound Measurements Length: (cm) 0.5 Width: (cm) 0.5 Depth: (cm) 0.1 Elijah Mora, Elijah Mora ([id number]) Area: (cm) 0.196 Volume: (cm) 0.02 % Reduction in Area: 40.6% % Reduction in Volume: 39.4% Epithelialization: None [id number] Page 7 of 7 Tunneling: No Undermining: No Wound Description Classification: Partial Thickness Exudate Amount: Medium Exudate Type: Serosanguineous Exudate Color: red, brown Foul Odor After Cleansing: No Slough/Fibrino Yes Wound Bed Granulation Amount: Small (1-33%) Exposed Structure Granulation Quality: Pink Fascia Exposed: No Necrotic Amount: Large (67-100%) Fat Layer (Subcutaneous Tissue) Exposed: Yes Necrotic Quality: Adherent Slough Tendon Exposed: No Muscle Exposed: No Joint Exposed: No Bone Exposed: No Treatment Notes Wound #1 (Hand - 4th Digit) Wound Laterality: Right, Distal Cleanser Peri-Wound Care Topical Primary Dressing Xeroform-HBD 2x2 (in/in) Discharge Instruction: Apply Xeroform-HBD 2x2 (in/in) as directed Secondary Dressing Coverlet Latex-Free Fabric Adhesive Dressings Discharge Instruction: 1.5 x 2 Secured With Compression Wrap Compression Stockings Add-Ons Electronic Signature(s) Signed: 01/25/2023 9:40:32 AM By: Yevonne Pax RN Entered By: Yevonne Pax on 01/24/2023 15:29:20 -------------------------------------------------------------------------------- Vitals Details Patient Name: Date of Service: [name], [name] 01/24/2023 3:15 PM Medical Record Number: [id number] Patient Account Number: [id number] Date of Birth/Sex: Treating RN: [birth year] (51 y.o. [name]) Yevonne Pax Primary Care Julee Stoll: [name] Other Clinician: Referring Kelita Wallis: Treating  Hellena Pridgen/Extender: Reinaldo Raddle in Treatment: 1 Vital Signs Time Taken: 15:25 Temperature (F): 98.2 Height (in): 70 Pulse (bpm): 83 Weight (lbs): 247 Respiratory Rate (breaths/min): 18 Body Mass Index (BMI): 35.4 Blood Pressure (mmHg): 141/89 Reference Range: 80 - 120 mg / dl Electronic Signature(s) Signed: 01/25/2023 9:40:32 AM By: Yevonne Pax RN Entered By: Yevonne Pax on 01/24/2023 15:26:38

## 2023-01-31 ENCOUNTER — Encounter: Payer: BC Managed Care – PPO | Attending: Physician Assistant | Admitting: Physician Assistant

## 2023-01-31 DIAGNOSIS — S61214A Laceration without foreign body of right ring finger without damage to nail, initial encounter: Secondary | ICD-10-CM | POA: Insufficient documentation

## 2023-01-31 DIAGNOSIS — E119 Type 2 diabetes mellitus without complications: Secondary | ICD-10-CM | POA: Insufficient documentation

## 2023-01-31 DIAGNOSIS — W260XXA Contact with knife, initial encounter: Secondary | ICD-10-CM | POA: Diagnosis not present

## 2023-01-31 NOTE — Progress Notes (Addendum)
RAYON, RUDKIN ([id number]) [id number] Page 1 of 4 Visit Report for 01/31/2023 Chief Complaint Document Details Patient Name: Date of Service: [name], [name] 01/31/2023 2:30 PM Medical Record Number: [id number] Patient Account Number: [id number] Date of Birth/Sex: Treating RN: 1971-03-29 (51 y.o. [name]) Yevonne Pax Primary Care Provider: [name] Other Clinician: Referring Provider: Treating Provider/Extender: Reinaldo Raddle in Treatment: 2 Information Obtained from: Patient Chief Complaint Right ring finger laceration Electronic Signature(s) Signed: 01/31/2023 2:40:13 PM By: Allen Derry PA-C Entered By: Allen Derry on 01/31/2023 14:40:13 -------------------------------------------------------------------------------- HPI Details Patient Name: Date of Service: [name], [name] 01/31/2023 2:30 PM Medical Record Number: [id number] Patient Account Number: [id number] Date of Birth/Sex: Treating RN: [birth year] (51 y.o. [name] Primary Care Provider: [name] Other Clinician: Referring Provider: Treating Provider/Extender: Reinaldo Raddle in Treatment: 2 History of Present Illness HPI Description: 01-17-2023 upon evaluation today patient presents for concerning a wound which is on his hand specifically the fourth digit at the tip that occurred on 01-12-2023 when he was cutting cucumbers for a salad and inadvertently cut through his finger. The good news is there does not appear to be infected and I think he is actually doing well in that regard. I do think however is going to take a little time to let the new epithelial tissue grow. Patient does have diabetes but no other major medical problems. 01-24-2023 upon evaluation today patient's finger actually appears to be doing excellent. Fortunately I do not see any signs of active infection locally nor systemically which is great news. No fevers,  chills, nausea, vomiting, or diarrhea. 01-31-2023 upon evaluation today patient appears to be doing well currently in regard to his wound which actually appears to be completely healed. It is a little bit dry but I do believe that some AandD ointment would be fine over this but everything else seems to be closed. Electronic Signature(s) Signed: 01/31/2023 3:16:32 PM By: Allen Derry PA-C Entered By: Allen Derry on 01/31/2023 15:16:31 -------------------------------------------------------------------------------- Physical Exam Details Patient Name: Date of Service: [name], [name] 01/31/2023 2:30 PM Medical Record Number: [id number] Patient Account Number: [id number] Date of Birth/Sex: Treating RN: May 21, 1971 (51 y.o. [name] Primary Care Provider: [name] Other Clinician: Referring Provider: Treating Provider/Extender: Reinaldo Raddle in Treatment: 2 Constitutional Well-nourished and well-hydrated in no acute distress. Respiratory normal breathing without difficulty. Psychiatric Elijah Mora, Elijah Mora ([id number]) [id number] Page 2 of 4 this patient is able to make decisions and demonstrates good insight into disease process. Alert and Oriented x 3. pleasant and cooperative. Notes Upon inspection patient's wound bed actually showed signs of good granulation epithelization at this point. Fortunately I do not see any signs of active infection locally nor systemically which is great news and overall I am extremely pleased with where we stand currently. Electronic Signature(s) Signed: 01/31/2023 3:16:46 PM By: Allen Derry PA-C Entered By: Allen Derry on 01/31/2023 15:16:45 -------------------------------------------------------------------------------- Physician Orders Details Patient Name: Date of Service: [name], [name] 01/31/2023 2:30 PM Medical Record Number: [id number] Patient Account Number: [id number] Date of  Birth/Sex: Treating RN: Nov 01, 1970 (51 y.o. [name] Primary Care Provider: [name] Other Clinician: Referring Provider: Treating Provider/Extender: Reinaldo Raddle in Treatment: 2 Verbal / Phone Orders: No Diagnosis Coding ICD-10 Coding Code Description [phone] Laceration without foreign body of right ring finger without damage to nail, initial encounter E11.622  Type 2 diabetes mellitus with other skin ulcer Discharge From [facility] Discharge from Wound Care Center Treatment Complete Electronic Signature(s) Signed: 02/03/2023 9:22:18 AM By: Yevonne Pax RN Signed: 02/03/2023 1:20:49 PM By: Allen Derry PA-C Previous Signature: 02/03/2023 9:18:56 AM Version By: Yevonne Pax RN Entered By: Yevonne Pax on 02/03/2023 09:22:18 -------------------------------------------------------------------------------- Problem List Details Patient Name: Date of Service: [name], [name] 01/31/2023 2:30 PM Medical Record Number: [id number] Patient Account Number: [id number] Date of Birth/Sex: Treating RN: Feb 27, 1971 (51 y.o. [name] Primary Care Provider: [name] Other Clinician: Referring Provider: Treating Provider/Extender: Reinaldo Raddle in Treatment: 2 Active Problems ICD-10 Encounter Code Description Active Date MDM Diagnosis [phone] Laceration without foreign body of right ring finger without damage to nail, 01/17/2023 No Yes initial encounter E11.622 Type 2 diabetes mellitus with other skin ulcer 01/17/2023 No Yes Inactive Problems Resolved Problems Electronic Signature(s) Signed: 01/31/2023 2:40:11 PM By: Roma Kayser, Tsutomu 2:40:11 PM By: Allen Derry PA-C Signed: 01/31/2023 H ([id number]) [id number] Page 3 of 4 Entered By: Allen Derry on 01/31/2023 14:40:10 -------------------------------------------------------------------------------- Progress Note Details Patient  Name: Date of Service: [name], [name] 01/31/2023 2:30 PM Medical Record Number: [id number] Patient Account Number: [id number] Date of Birth/Sex: Treating RN: 07/02/1971 (51 y.o. [name]) Yevonne Pax Primary Care Provider: [name] Other Clinician: Referring Provider: Treating Provider/Extender: Reinaldo Raddle in Treatment: 2 Subjective Chief Complaint Information obtained from Patient Right ring finger laceration History of Present Illness (HPI) 01-17-2023 upon evaluation today patient presents for concerning a wound which is on his hand specifically the fourth digit at the tip that occurred on 01-12-2023 when he was cutting cucumbers for a salad and inadvertently cut through his finger. The good news is there does not appear to be infected and I think he is actually doing well in that regard. I do think however is going to take a little time to let the new epithelial tissue grow. Patient does have diabetes but no other major medical problems. 01-24-2023 upon evaluation today patient's finger actually appears to be doing excellent. Fortunately I do not see any signs of active infection locally nor systemically which is great news. No fevers, chills, nausea, vomiting, or diarrhea. 01-31-2023 upon evaluation today patient appears to be doing well currently in regard to his wound which actually appears to be completely healed. It is a little bit dry but I do believe that some AandD ointment would be fine over this but everything else seems to be closed. Objective Constitutional Well-nourished and well-hydrated in no acute distress. Vitals Time Taken: 2:28 PM, Height: 70 in, Weight: 247 lbs, BMI: 35.4, Temperature: 97.9 F, Pulse: 84 bpm, Blood Pressure: 138/71 mmHg. Respiratory normal breathing without difficulty. Psychiatric this patient is able to make decisions and demonstrates good insight into disease process. Alert and Oriented x 3. pleasant and  cooperative. General Notes: Upon inspection patient's wound bed actually showed signs of good granulation epithelization at this point. Fortunately I do not see any signs of active infection locally nor systemically which is great news and overall I am extremely pleased with where we stand currently. Assessment Active Problems ICD-10 Laceration without foreign body of right ring finger without damage to nail, initial encounter Type 2 diabetes mellitus with other skin ulcer Plan 1. I am good recommend that the patient should discontinue wound care services at this point he appears to be completely healed and this has done extremely well. 2.  I am also can recommend that he should continue to monitor for any signs of infection or worsening. Overall I think that he is doing excellent and I think that other than just using AandD ointment daily on the area throughout the day he otherwise should be in good shape. Elijah Mora, Elijah Mora ([id number]) [id number] Page 4 of 4 We will see patient back for reevaluation in 1 week here in the clinic. If anything worsens or changes patient will contact our office for additional recommendations. Electronic Signature(s) Signed: 01/31/2023 3:17:15 PM By: Allen Derry PA-C Entered By: Allen Derry on 01/31/2023 15:17:14 -------------------------------------------------------------------------------- SuperBill Details Patient Name: Date of Service: [name], [name] 01/31/2023 Medical Record Number: [id number] Patient Account Number: [id number] Date of Birth/Sex: Treating RN: 08-10-1971 (51 y.o. [name]) Yevonne Pax Primary Care Provider: [name] Other Clinician: Referring Provider: Treating Provider/Extender: Reinaldo Raddle in Treatment: 2 Diagnosis Coding ICD-10 Codes Code Description [phone] Laceration without foreign body of right ring finger without damage to nail, initial encounter E11.622 Type 2  diabetes mellitus with other skin ulcer Facility Procedures : CPT4 Code: 13244010 Description: [phone] - WOUND CARE VISIT-LEV 2 EST PT Modifier: Quantity: 1 Physician Procedures : CPT4 Code Description Modifier [id number] - WC PHYS LEVEL 3 - EST PT ICD-10 Diagnosis Description S61.214A Laceration without foreign body of right ring finger without damage to nail, initial encounter E11.622 Type 2 diabetes mellitus with other  skin ulcer Quantity: 1 Electronic Signature(s) Signed: 02/03/2023 9:20:09 AM By: Yevonne Pax RN Signed: 02/03/2023 1:20:49 PM By: Allen Derry PA-C Previous Signature: 01/31/2023 3:17:37 PM Version By: Allen Derry PA-C Entered By: Yevonne Pax on 02/03/2023 09:20:09

## 2023-02-01 NOTE — Progress Notes (Addendum)
SEELEY, LETTER ([id number]) [id number] Page 1 of 6 Visit Report for 01/31/2023 Arrival Information Details Patient Name: Date of Service: [name], [name] 01/31/2023 2:30 PM Medical Record Number: [id number] Patient Account Number: [id number] Date of Birth/Sex: Treating RN: [birth year] (52 y.o. Elijah Mora) Yevonne Pax Primary Care Tyrel Lex: [name] Other Clinician: Referring Fredderick Swanger: Treating Sabrinia Prien/Extender: Reinaldo Raddle in Treatment: 2 Visit Information History Since Last Visit Added or deleted any medications: No Patient Arrived: Ambulatory Any new allergies or adverse reactions: No Arrival Time: 14:26 Had a fall or experienced change in No Accompanied By: self activities of daily living that may affect Transfer Assistance: None risk of falls: Patient Identification Verified: Yes Signs or symptoms of abuse/neglect since last visito No Secondary Verification Process Completed: Yes Hospitalized since last visit: No Patient Requires Transmission-Based Precautions: No Implantable device outside of the clinic excluding No Patient Has Alerts: No cellular tissue based products placed in the center since last visit: Has Dressing in Place as Prescribed: Yes Pain Present Now: No Electronic Signature(s) Signed: 02/01/2023 9:53:20 AM By: Yevonne Pax RN Entered By: Yevonne Pax on 01/31/2023 14:28:35 -------------------------------------------------------------------------------- Clinic Level of Care Assessment Details Patient Name: Date of Service: [name], [name] 01/31/2023 2:30 PM Medical Record Number: [id number] Patient Account Number: [id number] Date of Birth/Sex: Treating RN: September 06, 1971 (52 y.o. Elijah Mora) Yevonne Pax Primary Care Aislee Landgren: [name] Other Clinician: Referring Teaghan Formica: Treating Kamrynn Melott/Extender: Reinaldo Raddle in Treatment: 2 Clinic Level of Care Assessment Items TOOL 4  Quantity Score X- 1 0 Use when only an EandM is performed on FOLLOW-UP visit ASSESSMENTS - Nursing Assessment / Reassessment X- 1 10 Reassessment of Co-morbidities (includes updates in patient status) X- 1 5 Reassessment of Adherence to Treatment Plan ASSESSMENTS - Wound and Skin A ssessment / Reassessment X - Simple Wound Assessment / Reassessment - one wound 1 5 []  - 0 Complex Wound Assessment / Reassessment - multiple wounds []  - 0 Dermatologic / Skin Assessment (not related to wound area) ASSESSMENTS - Focused Assessment []  - 0 Circumferential Edema Measurements - multi extremities []  - 0 Nutritional Assessment / Counseling / Intervention []  - 0 Lower Extremity Assessment (monofilament, tuning fork, pulses) []  - 0 Peripheral Arterial Disease Assessment (using hand held doppler) ASSESSMENTS - Ostomy and/or Continence Assessment and Care []  - 0 Incontinence Assessment and Management []  - 0 Ostomy Care Assessment and Management (repouching, etc.) PROCESS - Coordination of Care []  - 0 Simple Patient / Family Education for ongoing care AHADU, SKAHAN ([id number]) [id number] Page 2 of 6 []  - 0 Complex (extensive) Patient / Family Education for ongoing care []  - 0 Staff obtains Chiropractor, Records, T Results / Process Orders est []  - 0 Staff telephones HHA, Nursing Homes / Clarify orders / etc []  - 0 Routine Transfer to another Facility (non-emergent condition) []  - 0 Routine Hospital Admission (non-emergent condition) []  - 0 New Admissions / Manufacturing engineer / Ordering NPWT Apligraf, etc. , []  - 0 Emergency Hospital Admission (emergent condition) X- 1 10 Simple Discharge Coordination []  - 0 Complex (extensive) Discharge Coordination PROCESS - Special Needs []  - 0 Pediatric / Minor Patient Management []  - 0 Isolation Patient Management []  - 0 Hearing / Language / Visual special needs []  - 0 Assessment of Community  assistance (transportation, D/C planning, etc.) []  - 0 Additional assistance / Altered mentation []  - 0 Support Surface(s) Assessment (bed, cushion, seat, etc.) INTERVENTIONS - Wound Cleansing / Measurement X -  Simple Wound Cleansing - one wound 1 5 []  - 0 Complex Wound Cleansing - multiple wounds []  - 0 Wound Imaging (photographs - any number of wounds) []  - 0 Wound Tracing (instead of photographs) X- 1 5 Simple Wound Measurement - one wound []  - 0 Complex Wound Measurement - multiple wounds INTERVENTIONS - Wound Dressings []  - 0 Small Wound Dressing one or multiple wounds []  - 0 Medium Wound Dressing one or multiple wounds []  - 0 Large Wound Dressing one or multiple wounds []  - 0 Application of Medications - topical []  - 0 Application of Medications - injection INTERVENTIONS - Miscellaneous []  - 0 External ear exam []  - 0 Specimen Collection (cultures, biopsies, blood, body fluids, etc.) []  - 0 Specimen(s) / Culture(s) sent or taken to Lab for analysis []  - 0 Patient Transfer (multiple staff / Nurse, adult / Similar devices) []  - 0 Simple Staple / Suture removal (25 or less) []  - 0 Complex Staple / Suture removal (26 or more) []  - 0 Hypo / Hyperglycemic Management (close monitor of Blood Glucose) []  - 0 Ankle / Brachial Index (ABI) - do not check if billed separately X- 1 5 Vital Signs Has the patient been seen at the hospital within the last three years: Yes Total Score: 45 Level Of Care: New/Established - Level 2 Electronic Signature(s) Signed: 02/03/2023 9:26:52 AM By: Yevonne Pax RN Entered By: Yevonne Pax on 02/03/2023 09:19:44 Justin Mend ([id number]) [id number] Page 3 of 6 -------------------------------------------------------------------------------- Encounter Discharge Information Details Patient Name: Date of Service: [name], [name] 01/31/2023 2:30 PM Medical Record Number: [id number] Patient Account  Number: [id number] Date of Birth/Sex: Treating RN: 1970-11-13 (52 y.o. Elijah Mora) Yevonne Pax Primary Care Nesbit Michon: [name] Other Clinician: Referring Rashada Klontz: Treating Norena Bratton/Extender: Reinaldo Raddle in Treatment: 2 Encounter Discharge Information Items Discharge Condition: Stable Ambulatory Status: Ambulatory Discharge Destination: Home Transportation: Private Auto Accompanied By: self Schedule Follow-up Appointment: No Clinical Summary of Care: Electronic Signature(s) Signed: 02/03/2023 9:21:56 AM By: Yevonne Pax RN Entered By: Yevonne Pax on 02/03/2023 09:21:56 -------------------------------------------------------------------------------- Lower Extremity Assessment Details Patient Name: Date of Service: [name], [name] 01/31/2023 2:30 PM Medical Record Number: [id number] Patient Account Number: [id number] Date of Birth/Sex: Treating RN: 10/05/1970 (51 y.o. Elijah Mora) Yevonne Pax Primary Care Danaija Eskridge: [name] Other Clinician: Referring Franco Duley: Treating Hagop Mccollam/Extender: Reinaldo Raddle in Treatment: 2 Electronic Signature(s) Signed: 02/03/2023 9:18:35 AM By: Yevonne Pax RN Entered By: Yevonne Pax on 02/03/2023 09:18:34 -------------------------------------------------------------------------------- Multi Wound Chart Details Patient Name: Date of Service: [name], [name] 01/31/2023 2:30 PM Medical Record Number: [id number] Patient Account Number: [id number] Date of Birth/Sex: Treating RN: [birth year] (51 y.o. Elijah Mora) Yevonne Pax Primary Care Chavy Avera: [name] Other Clinician: Referring Myles Mallicoat: Treating Shirelle Tootle/Extender: Reinaldo Raddle in Treatment: 2 Vital Signs Height(in): 70 Pulse(bpm): 84 Weight(lbs): 247 Blood Pressure(mmHg): 138/71 Body Mass Index(BMI): 35.4 Temperature(F): 97.9 Respiratory Rate(breaths/min): [1:Photos: No Photos Right, Distal Hand - 4th Digit Wound  Location: Trauma Wounding Event: Trauma, Other Primary Etiology: Received Chemotherapy, Received Comorbid History: Radiation 01/12/2023 Date Acquired: 2 Weeks of Treatment: Healed - Epithelialized Wound  Status: No Wound Recurrence:] [N/A:N/A N/A N/A N/A N/A N/A N/A N/A N/A] ARTHAS, VASIL ([id number]) [1:0x0x0 Measurements L x W x D (cm) 0 A (cm) : rea 0 Volume (cm) : 100.00% % Reduction in A rea: 100.00% % Reduction in Volume: Partial Thickness Classification: None Present Exudate A mount: None  Present (0%) Granulation A mount: None Present (0%)  Necrotic A mount: Large (67-100%) Epithelialization:] [N/A:N/A N/A N/A N/A N/A N/A N/A N/A N/A N/A] Treatment Notes Electronic Signature(s) Signed: 02/03/2023 9:18:40 AM By: Yevonne Pax RN Entered By: Yevonne Pax on 02/03/2023 09:18:40 -------------------------------------------------------------------------------- Multi-Disciplinary Care Plan Details Patient Name: Date of Service: [name], [name] 01/31/2023 2:30 PM Medical Record Number: [id number] Patient Account Number: [id number] Date of Birth/Sex: Treating RN: 1971/05/19 (51 y.o. Elijah Mora) Yevonne Pax Primary Care Albina Gosney: [name] Other Clinician: Referring Kamilah Correia: Treating Daisha Filosa/Extender: Reinaldo Raddle in Treatment: 2 Active Inactive Electronic Signature(s) Signed: 02/03/2023 9:20:36 AM By: Yevonne Pax RN Entered By: Yevonne Pax on 02/03/2023 09:20:35 -------------------------------------------------------------------------------- Pain Assessment Details Patient Name: Date of Service: [name], [name] 01/31/2023 2:30 PM Medical Record Number: [id number] Patient Account Number: [id number] Date of Birth/Sex: Treating RN: November 26, 1970 (51 y.o. [name] Primary Care Vere Diantonio: [name] Other Clinician: Referring Anisah Kuck: Treating Avy Barlett/Extender: Reinaldo Raddle in Treatment: 2 Active Problems Location of  Pain Severity and Description of Pain Patient Has Paino No Site Locations Pain Management and Medication Current Pain Management: BANDON, CHERNE ([id number]) [id number] Page 5 of 6 Electronic Signature(s) Signed: 02/01/2023 9:53:20 AM By: Yevonne Pax RN Entered By: Yevonne Pax on 01/31/2023 14:29:34 -------------------------------------------------------------------------------- Patient/Caregiver Education Details Patient Name: Date of Service: [name], BRA NDO N Rexene Edison 5/7/2024andnbsp2:30 PM Medical Record Number: [id number] Patient Account Number: [id number] Date of Birth/Gender: Treating RN: [birth year] (51 y.o. Elijah Mora) Yevonne Pax Primary Care Physician: [name] Other Clinician: Referring Physician: Treating Physician/Extender: Reinaldo Raddle in Treatment: 2 Education Assessment Education Provided To: Patient Education Topics Provided Wound/Skin Impairment: Handouts: Caring for Your Ulcer Methods: Explain/Verbal Responses: State content correctly Electronic Signature(s) Signed: 02/03/2023 9:26:52 AM By: Yevonne Pax RN Entered By: Yevonne Pax on 02/03/2023 09:21:16 -------------------------------------------------------------------------------- Wound Assessment Details Patient Name: Date of Service: [name], [name] 01/31/2023 2:30 PM Medical Record Number: [id number] Patient Account Number: [id number] Date of Birth/Sex: Treating RN: 02-19-1971 (51 y.o. Elijah Mora) Yevonne Pax Primary Care Tobe Kervin: [name] Other Clinician: Referring Gabino Hagin: Treating Caylie Sandquist/Extender: Reinaldo Raddle in Treatment: 2 Wound Status Wound Number: 1 Primary Etiology: Trauma, Other Wound Location: Right, Distal Hand - 4th Digit Wound Status: Healed - Epithelialized Wounding Event: Trauma Comorbid History: Received Chemotherapy, Received Radiation Date Acquired: 01/12/2023 Weeks Of Treatment: 2 Clustered Wound:  No Wound Measurements Length: (cm) Width: (cm) Depth: (cm) Area: (cm) Volume: (cm) 0 % Reduction in Area: 100% 0 % Reduction in Volume: 100% 0 Epithelialization: Large (67-100%) 0 Tunneling: No 0 Undermining: No Wound Description Classification: Partial Thickness Exudate Amount: None Present Foul Odor After Cleansing: No Slough/Fibrino No Wound Bed Granulation Amount: None Present (0%) Necrotic Amount: None Present (0%) BRAIDON, BETSCHART ([id number]) [id number] Page 6 of 6 Treatment Notes Wound #1 (Hand - 4th Digit) Wound Laterality: Right, Distal Cleanser Peri-Wound Care Topical Primary Dressing Secondary Dressing Secured With Compression Wrap Compression Stockings Add-Ons Electronic Signature(s) Signed: 02/03/2023 9:16:13 AM By: Yevonne Pax RN Entered By: Yevonne Pax on 02/03/2023 09:16:13 -------------------------------------------------------------------------------- Vitals Details Patient Name: Date of Service: [name], [name] 01/31/2023 2:30 PM Medical Record Number: [id number] Patient Account Number: [id number] Date of Birth/Sex: Treating RN: 13-Jul-1971 (51 y.o. [name] Primary Care Jacqulene Huntley: [name] Other Clinician: Referring Eulalah Rupert: Treating Danalee Flath/Extender: Reinaldo Raddle in Treatment: 2 Vital Signs Time Taken: 14:28 Temperature (F):  97.9 Height (in): 70 Pulse (bpm): 84 Weight (lbs): 247 Blood Pressure (mmHg): 138/71 Body Mass Index (BMI): 35.4 Reference Range: 80 - 120 mg / dl Electronic Signature(s) Signed: 02/01/2023 9:53:20 AM By: Yevonne Pax RN Entered By: Yevonne Pax on 01/31/2023 14:29:09

## 2023-02-15 ENCOUNTER — Encounter: Payer: Self-pay | Admitting: Family Medicine

## 2023-02-15 ENCOUNTER — Ambulatory Visit: Payer: BC Managed Care – PPO | Admitting: Family Medicine

## 2023-02-15 VITALS — BP 130/86 | HR 87 | Temp 98.6°F | Resp 16 | Wt 252.6 lb

## 2023-02-15 DIAGNOSIS — Z1211 Encounter for screening for malignant neoplasm of colon: Secondary | ICD-10-CM

## 2023-02-15 DIAGNOSIS — I10 Essential (primary) hypertension: Secondary | ICD-10-CM

## 2023-02-15 DIAGNOSIS — E119 Type 2 diabetes mellitus without complications: Secondary | ICD-10-CM

## 2023-02-15 DIAGNOSIS — G4733 Obstructive sleep apnea (adult) (pediatric): Secondary | ICD-10-CM | POA: Insufficient documentation

## 2023-02-15 DIAGNOSIS — E781 Pure hyperglyceridemia: Secondary | ICD-10-CM | POA: Diagnosis not present

## 2023-02-15 DIAGNOSIS — T148XXA Other injury of unspecified body region, initial encounter: Secondary | ICD-10-CM

## 2023-02-15 MED ORDER — ONETOUCH ULTRA VI STRP
ORAL_STRIP | 4 refills | Status: AC
Start: 2023-02-15 — End: ?

## 2023-02-15 NOTE — Progress Notes (Signed)
Established patient visit   I,Roshena L Chambers,acting as a scribe for [name], MD.,have documented all relevant documentation on the behalf of [name], MD,as directed by  [name], MD  Patient: Elijah Mora   DOB: [birth year]   52 y.o. Male  MRN: [id number] Visit Date: 02/15/2023  Today's healthcare provider: [name], MD   Chief Complaint  Patient presents with   Diabetes   Hypertension   Hyperlipidemia   Subjective    HPI  Diabetes Mellitus Type II, Follow-up  Lab Results  Component Value Date   HGBA1C 10.0 (A) 01/13/2023   HGBA1C 7.8 (A) 02/28/2022   HGBA1C 8.5 (H) 10/26/2021   Wt Readings from Last 3 Encounters:  02/15/23 252 lb 9.6 oz (114.6 kg)  01/13/23 251 lb (113.9 kg)  06/20/22 253 lb (114.8 kg)   Last seen for diabetes 1 months ago.  Management since then includes increasing Metformin to 2000mg  and start taking glipizide 5 mg BID . He reports good compliance with treatment. He is having side effects. Occasional hypoglycemia with sugars down to the 60s.  Symptoms: Yes fatigue No foot ulcerations  No appetite changes No nausea  No paresthesia of the feet  No polydipsia  Yes polyuria No visual disturbances   No vomiting     Home blood sugar records: fasting range: 138 average  Episodes of hypoglycemia? No occurs 2 times a week   Current insulin regiment: none Most Recent Eye Exam: within the last year Current exercise: walking Current diet habits: well balanced  Pertinent Labs: Lab Results  Component Value Date   CHOL 105 07/30/2021   HDL 29 (L) 07/30/2021   LDLCALC 44 07/30/2021   TRIG 198 (H) 07/30/2021   CHOLHDL 3.6 07/30/2021   Lab Results  Component Value Date   NA 142 07/30/2021   K 4.1 07/30/2021   CREATININE 1.08 07/30/2021   EGFR 85 07/30/2021   LABMICR 13.7 02/28/2022   MICRALBCREAT 9 02/28/2022      ---------------------------------------------------------------------------------------------------   Medications: Outpatient Medications Prior to Visit  Medication Sig   aspirin 81 MG tablet Take 81 mg by mouth daily.   atorvastatin (LIPITOR) 40 MG tablet TAKE 1 TABLET (40 MG TOTAL) BY MOUTH DAILY. PLEASE SCHEDULE AN OFFICE VISIT BEFORE ANYMORE REFILLS.   Blood Glucose Monitoring Suppl (ONE TOUCH ULTRA 2) w/Device KIT Use to check sugar daily for type 2 diabetes E11.9   cetirizine (ZYRTEC) 10 MG tablet Take 10 mg by mouth daily.   cyclobenzaprine (FLEXERIL) 10 MG tablet Take 1 tablet by mouth 2 (two) times daily.   escitalopram (LEXAPRO) 10 MG tablet TAKE 1 TABLET BY MOUTH EVERY DAY   fluticasone (FLONASE) 50 MCG/ACT nasal spray Place 1 spray into both nostrils daily.   glipiZIDE (GLUCOTROL) 5 MG tablet Take 1 tablet (5 mg total) by mouth 2 (two) times daily before a meal.   glucose blood (ONETOUCH ULTRA) test strip Use to check sugar daily for type 2 diabetes E11.9   ibuprofen (ADVIL,MOTRIN) 800 MG tablet Take 1 tablet (800 mg total) by mouth every 8 (eight) hours as needed.   Lancets (ONETOUCH ULTRASOFT) lancets Use to check blood sugar daily for type 2 diabetes E11.9   metFORMIN (GLUCOPHAGE) 1000 MG tablet Take 1 tablet (1,000 mg total) by mouth 2 (two) times daily with a meal.   triamcinolone ointment (KENALOG) 0.5 % Apply 1 Application topically 2 (two) times daily.   valsartan-hydrochlorothiazide (DIOVAN-HCT) 80-12.5 MG tablet TAKE 1 TABLET BY  MOUTH EVERY DAY   [DISCONTINUED] cephALEXin (KEFLEX) 500 MG capsule Take 1 capsule (500 mg total) by mouth 2 (two) times daily.   No facility-administered medications prior to visit.    Review of Systems  Constitutional:  Positive for fatigue. Negative for appetite change, chills and fever.  Respiratory:  Negative for chest tightness, shortness of breath and wheezing.   Cardiovascular:  Negative for chest pain and palpitations.   Gastrointestinal:  Negative for abdominal pain, nausea and vomiting.       Objective    BP 130/86   Pulse 87   Temp 98.6 F (37 C) (Oral)   Resp 16   Wt 252 lb 9.6 oz (114.6 kg)   SpO2 100% Comment: room air  BMI 36.24 kg/m    Physical Exam   General appearance: Mildly obese male, cooperative and in no acute distress Head: Normocephalic, without obvious abnormality, atraumatic Respiratory: Respirations even and unlabored, normal respiratory rate Extremities: All extremities are intact. Well healed new skin over wound of right 4th finger. No discharge or surrounding erythema.  Skin: Skin color, texture, turgor normal. No rashes seen  Psych: Appropriate mood and affect. Neurologic: Mental status: Alert, oriented to person, place, and time, thought content appropriate.     Assessment & Plan     1. Type 2 diabetes mellitus without complication, without long-term current use of insulin (HCC) Home sugars much better since starting glipizide and doubling dose of metformin. He is having some mild hypoglycemia sx when his blood sugars drop into the 60s.  - Hemoglobin A1c - glucose blood (ONETOUCH ULTRA) test strip; Use to check sugar daily for type 2 diabetes E11.9  Dispense: 100 each; Refill: 4  2. Hypertriglyceridemia He is tolerating atorvastatin well with no adverse effects.     3. Primary hypertension Near goal. Continue current medications.   - CBC - Comprehensive metabolic panel - Lipid panel  4. Morbid obesity (HCC) Continue to work on losing weight   5. Open wound Completely healed   6. Colon cancer screening  - Ambulatory referral to gastroenterology for colonoscopy      The entirety of the information documented in the History of Present Illness, Review of Systems and Physical Exam were personally obtained by me. Portions of this information were initially documented by the CMA and reviewed by me for thoroughness and accuracy.     [name], MD   [facility] [phone] (phone) [phone] (fax)  [facility]

## 2023-02-16 LAB — COMPREHENSIVE METABOLIC PANEL
ALT: 47 IU/L — ABNORMAL HIGH (ref 0–44)
AST: 27 IU/L (ref 0–40)
Albumin/Globulin Ratio: 2 (ref 1.2–2.2)
Albumin: 4.4 g/dL (ref 4.1–5.1)
Alkaline Phosphatase: 66 IU/L (ref 44–121)
BUN/Creatinine Ratio: 21 — ABNORMAL HIGH (ref 9–20)
BUN: 20 mg/dL (ref 6–24)
Bilirubin Total: 0.3 mg/dL (ref 0.0–1.2)
CO2: 21 mmol/L (ref 20–29)
Calcium: 9.6 mg/dL (ref 8.7–10.2)
Chloride: 105 mmol/L (ref 96–106)
Creatinine, Ser: 0.94 mg/dL (ref 0.76–1.27)
Globulin, Total: 2.2 g/dL (ref 1.5–4.5)
Glucose: 148 mg/dL — ABNORMAL HIGH (ref 70–99)
Potassium: 4.3 mmol/L (ref 3.5–5.2)
Sodium: 142 mmol/L (ref 134–144)
Total Protein: 6.6 g/dL (ref 6.0–8.5)
eGFR: 99 mL/min/{1.73_m2} (ref >59)

## 2023-02-16 LAB — CBC
Hematocrit: 42.4 % (ref 37.5–51.0)
Hemoglobin: 14.4 g/dL (ref 13.0–17.7)
MCH: 31.2 pg (ref 26.6–33.0)
MCHC: 34 g/dL (ref 31.5–35.7)
MCV: 92 fL (ref 79–97)
Platelets: 245 10*3/uL (ref 150–450)
RBC: 4.62 x10E6/uL (ref 4.14–5.80)
RDW: 12.4 % (ref 11.6–15.4)
WBC: 7.4 10*3/uL (ref 3.4–10.8)

## 2023-02-16 LAB — LIPID PANEL
Chol/HDL Ratio: 3.6 ratio (ref 0.0–5.0)
Cholesterol, Total: 102 mg/dL (ref 100–199)
HDL: 28 mg/dL — ABNORMAL LOW (ref >39)
LDL Chol Calc (NIH): 39 mg/dL (ref 0–99)
Triglycerides: 222 mg/dL — ABNORMAL HIGH (ref 0–149)
VLDL Cholesterol Cal: 35 mg/dL (ref 5–40)

## 2023-02-16 LAB — HEMOGLOBIN A1C
Est. average glucose Bld gHb Est-mCnc: 177 mg/dL
Hgb A1c MFr Bld: 7.8 % — ABNORMAL HIGH (ref 4.8–5.6)

## 2023-02-22 ENCOUNTER — Encounter: Payer: Self-pay | Admitting: *Deleted

## 2023-03-26 ENCOUNTER — Other Ambulatory Visit: Payer: Self-pay | Admitting: Family Medicine

## 2023-03-26 DIAGNOSIS — F419 Anxiety disorder, unspecified: Secondary | ICD-10-CM

## 2023-04-10 ENCOUNTER — Other Ambulatory Visit: Payer: Self-pay | Admitting: Family Medicine

## 2023-04-10 DIAGNOSIS — I10 Essential (primary) hypertension: Secondary | ICD-10-CM

## 2023-04-13 ENCOUNTER — Other Ambulatory Visit: Payer: Self-pay | Admitting: Physician Assistant

## 2023-04-13 DIAGNOSIS — E119 Type 2 diabetes mellitus without complications: Secondary | ICD-10-CM

## 2023-04-21 ENCOUNTER — Other Ambulatory Visit: Payer: Self-pay | Admitting: Family Medicine

## 2023-04-21 DIAGNOSIS — E781 Pure hyperglyceridemia: Secondary | ICD-10-CM

## 2023-04-21 NOTE — Telephone Encounter (Signed)
Requested Prescriptions  Pending Prescriptions Disp Refills   atorvastatin (LIPITOR) 40 MG tablet [Pharmacy Med Name: ATORVASTATIN 40 MG TABLET] 90 tablet 0    Sig: TAKE 1 TABLET (40 MG TOTAL) BY MOUTH DAILY. PLEASE SCHEDULE AN OFFICE VISIT BEFORE ANYMORE REFILLS.     Cardiovascular:  Antilipid - Statins Failed - 04/21/2023  2:30 PM      Failed - Lipid Panel in normal range within the last 12 months    Cholesterol, Total  Date Value Ref Range Status  02/15/2023 102 100 - 199 mg/dL Final   LDL Chol Calc (NIH)  Date Value Ref Range Status  02/15/2023 39 0 - 99 mg/dL Final   HDL  Date Value Ref Range Status  02/15/2023 28 (L) >39 mg/dL Final   Triglycerides  Date Value Ref Range Status  02/15/2023 222 (H) 0 - 149 mg/dL Final         Passed - Patient is not pregnant      Passed - Valid encounter within last 12 months    Recent Outpatient Visits           2 months ago Type 2 diabetes mellitus without complication, without long-term current use of insulin (HCC)   [place] [facility] [name], MD   3 months ago Type 2 diabetes mellitus without complication, without long-term current use of insulin (HCC)   [place] [facility] [place], Lucas, PA-C   10 months ago Venous stasis dermatitis of right lower extremity   [facility] [place] Dense M, DO   11 months ago Lumbar radiculopathy   [place] [place] Dense M, DO   1 year ago Type 2 diabetes mellitus without complication, without long-term current use of insulin (HCC)   [place] [facility] [name], MD       Future Appointments             In 1 month Fisher, Demetrios Isaacs, MD [facility]

## 2023-06-16 ENCOUNTER — Ambulatory Visit: Payer: BC Managed Care – PPO | Admitting: Family Medicine

## 2023-06-16 ENCOUNTER — Encounter: Payer: Self-pay | Admitting: Family Medicine

## 2023-06-16 VITALS — BP 138/91 | HR 87 | Ht 70.0 in | Wt 261.1 lb

## 2023-06-16 DIAGNOSIS — I10 Essential (primary) hypertension: Secondary | ICD-10-CM | POA: Diagnosis not present

## 2023-06-16 DIAGNOSIS — Z7984 Long term (current) use of oral hypoglycemic drugs: Secondary | ICD-10-CM

## 2023-06-16 DIAGNOSIS — E119 Type 2 diabetes mellitus without complications: Secondary | ICD-10-CM

## 2023-06-16 DIAGNOSIS — Z23 Encounter for immunization: Secondary | ICD-10-CM | POA: Diagnosis not present

## 2023-06-16 DIAGNOSIS — E781 Pure hyperglyceridemia: Secondary | ICD-10-CM

## 2023-06-16 LAB — POCT GLYCOSYLATED HEMOGLOBIN (HGB A1C)
Est. average glucose Bld gHb Est-mCnc: 137
Hemoglobin A1C: 6.4 % — AB (ref 4.0–5.6)

## 2023-06-16 MED ORDER — MOUNJARO 2.5 MG/0.5ML ~~LOC~~ SOAJ: 2.5000 mg | SUBCUTANEOUS | 0 refills | Status: DC

## 2023-06-16 NOTE — Patient Instructions (Signed)
Elijah Mora  Please review the attached list of medications and notify my office if there are any errors.   . Please bring all of your medications to every appointment so we can make sure that our medication list is the same as yours.

## 2023-06-16 NOTE — Progress Notes (Signed)
Established patient visit   Patient: Elijah Mora   DOB: 12-07-1970   52 y.o. Male  MRN: [id number] Visit Date: 06/16/2023  Today's healthcare provider: [name], MD   Chief Complaint  Patient presents with   Medical Management of Chronic Issues    Follow up on diabetes, would like to discuss monjuaro   Diabetes   Hypertension   Hyperlipidemia   Subjective    Discussed the use of AI scribe software for clinical note transcription with the patient, who gave verbal consent to proceed.  History of Present Illness   The patient, with a history of diabetes, presents for a follow-up visit to discuss his blood sugar control. He reports that his blood sugar levels have been well-controlled, with a recent HbA1c of 6.4. However, he expresses concern about weight gain and episodes of hypoglycemia while on glipizide. He has maintained a regular exercise regimen and has not noticed any significant changes in his diet or physical activity that would account for the weight gain.  The patient has been considering a switch to [facility], a medication he has heard positive reviews about from colleagues and which is covered by his insurance. He is interested in this medication due to its potential for weight loss and fewer gastrointestinal side effects compared to other diabetes medications. He has no known contraindications to this medication, such as a family history of MEN cancers.  In addition to his diabetes management, the patient mentions he has not yet completed a recommended colonoscopy. He plans to schedule this procedure soon.       Medications: Outpatient Medications Prior to Visit  Medication Sig   aspirin 81 MG tablet Take 81 mg by mouth daily.   atorvastatin (LIPITOR) 40 MG tablet TAKE 1 TABLET (40 MG TOTAL) BY MOUTH DAILY. PLEASE SCHEDULE AN OFFICE VISIT BEFORE ANYMORE REFILLS.   Blood Glucose Monitoring Suppl (ONE TOUCH ULTRA 2) w/Device KIT Use to check sugar daily  for type 2 diabetes E11.9   cetirizine (ZYRTEC) 10 MG tablet Take 10 mg by mouth daily.   cyclobenzaprine (FLEXERIL) 10 MG tablet Take 1 tablet by mouth 2 (two) times daily.   escitalopram (LEXAPRO) 10 MG tablet TAKE 1 TABLET BY MOUTH EVERY DAY   fluticasone (FLONASE) 50 MCG/ACT nasal spray Place 1 spray into both nostrils daily.   glipiZIDE (GLUCOTROL) 5 MG tablet TAKE 1 TABLET (5 MG TOTAL) BY MOUTH TWICE A DAY BEFORE MEALS   glucose blood (ONETOUCH ULTRA) test strip Use to check sugar daily for type 2 diabetes E11.9   ibuprofen (ADVIL,MOTRIN) 800 MG tablet Take 1 tablet (800 mg total) by mouth every 8 (eight) hours as needed.   Lancets (ONETOUCH ULTRASOFT) lancets Use to check blood sugar daily for type 2 diabetes E11.9   metFORMIN (GLUCOPHAGE) 1000 MG tablet Take 1 tablet (1,000 mg total) by mouth 2 (two) times daily with a meal.   triamcinolone ointment (KENALOG) 0.5 % Apply 1 Application topically 2 (two) times daily.   valsartan-hydrochlorothiazide (DIOVAN-HCT) 80-12.5 MG tablet TAKE 1 TABLET BY MOUTH EVERY DAY   No facility-administered medications prior to visit.   Review of Systems     Objective    BP (!) 138/91 (BP Location: Left Arm, Patient Position: Sitting, Cuff Size: Large)   Pulse 87   Ht 5\' 10"  (1.778 m)   Wt 261 lb 1.6 oz (118.4 kg)   SpO2 100%   BMI 37.46 kg/m   Physical Exam  Physical Exam  Results for orders placed or performed in visit on 06/16/23  POCT HgB A1C  Result Value Ref Range   Hemoglobin A1C 6.4 (A) 4.0 - 5.6 %   Est. average glucose Bld gHb Est-mCnc 137     Assessment & Plan        Type 2 Diabetes Mellitus A1c 6.4 on Metformin and Glipizide. Patient reports weight gain and hypoglycemic episodes on Glipizide. Discussed switching to [employer] (Semaglutide), a GLP-1 receptor agonist, for better weight management and fewer hypoglycemic episodes. -Start Mounjaro 2.5mg  once weekly for 4 weeks, then increase to 5mg  once weekly. -Continue  Metformin. -After 2 doses of Mounjaro and if well-tolerated, discontinue Glipizide. -Send 90-day prescription for Mounjaro 5mg  in mid-October. -Check blood sugars regularly at home. -Counseled on potential adverse effects, particularly GI effects  General Health Maintenance -Flu shot already administered. -Patient to schedule colonoscopy. -Schedule follow-up appointment in early January 2025.    Return in about 15 weeks (around 09/29/2023).      [name], MD  [facility] [phone] (phone) [phone] (fax)  [facility]

## 2023-06-16 NOTE — Progress Notes (Deleted)
error 

## 2023-07-08 ENCOUNTER — Other Ambulatory Visit: Payer: Self-pay | Admitting: Family Medicine

## 2023-07-08 ENCOUNTER — Other Ambulatory Visit: Payer: Self-pay | Admitting: Physician Assistant

## 2023-07-08 DIAGNOSIS — I10 Essential (primary) hypertension: Secondary | ICD-10-CM

## 2023-07-08 DIAGNOSIS — E119 Type 2 diabetes mellitus without complications: Secondary | ICD-10-CM

## 2023-07-10 NOTE — Telephone Encounter (Signed)
Requested Prescriptions  Pending Prescriptions Disp Refills   valsartan-hydrochlorothiazide (DIOVAN-HCT) 80-12.5 MG tablet [Pharmacy Med Name: VALSARTAN-HCTZ 80-12.5 MG TAB] 90 tablet 0    Sig: TAKE 1 TABLET BY MOUTH EVERY DAY     Cardiovascular: ARB + Diuretic Combos Failed - 07/08/2023  8:33 AM      Failed - Last BP in normal range    BP Readings from Last 1 Encounters:  06/16/23 (!) 138/91         Passed - K in normal range and within 180 days    Potassium  Date Value Ref Range Status  02/15/2023 4.3 3.5 - 5.2 mmol/L Final  08/05/2013 4.1 3.5 - 5.1 mmol/L Final         Passed - Na in normal range and within 180 days    Sodium  Date Value Ref Range Status  02/15/2023 142 134 - 144 mmol/L Final  08/05/2013 142 136 - 145 mmol/L Final         Passed - Cr in normal range and within 180 days    Creatinine  Date Value Ref Range Status  08/05/2013 1.11 0.60 - 1.30 mg/dL Final   Creatinine, Ser  Date Value Ref Range Status  02/15/2023 0.94 0.76 - 1.27 mg/dL Final         Passed - eGFR is 10 or above and within 180 days    EGFR (African American)  Date Value Ref Range Status  08/05/2013 >60  Final   GFR calc Af Amer  Date Value Ref Range Status  05/29/2020 112 >59 mL/min/1.73 Final    Comment:    **[facility] currently reports eGFR in compliance with the current**   recommendations of the [employer]. [facility] will   update reporting as new guidelines are published from the NKF-ASN   Task force.    EGFR (Non-African Amer.)  Date Value Ref Range Status  08/05/2013 >60  Final    Comment:    eGFR values <11mL/min/1.73 m2 may be an indication of chronic kidney disease (CKD). Calculated eGFR is useful in patients with stable renal function. The eGFR calculation will not be reliable in acutely ill patients when serum creatinine is changing rapidly. It is not useful in  patients on dialysis. The eGFR calculation may not be applicable to patients at the low  and high extremes of body sizes, pregnant women, and vegetarians.    GFR calc non Af Amer  Date Value Ref Range Status  05/29/2020 97 >59 mL/min/1.73 Final   eGFR  Date Value Ref Range Status  02/15/2023 99 >59 mL/min/1.73 Final         Passed - Patient is not pregnant      Passed - Valid encounter within last 6 months    Recent Outpatient Visits           3 weeks ago Diabetes mellitus without complication (HCC)   [place] [facility] [name], MD   4 months ago Type 2 diabetes mellitus without complication, without long-term current use of insulin (HCC)   [facility] [name], MD   5 months ago Type 2 diabetes mellitus without complication, without long-term current use of insulin [facility])   [place] [facility] [place], [place], PA-C   1 year ago Venous stasis dermatitis of right lower extremity   [facility] Caro Laroche, DO   1 year ago Lumbar radiculopathy   [place] [facility]  Caro Laroche, DO       Future Appointments             In 2 months Fisher, Demetrios Isaacs, MD [facility], [facility]

## 2023-07-10 NOTE — Telephone Encounter (Signed)
Requested Prescriptions  Pending Prescriptions Disp Refills   metFORMIN (GLUCOPHAGE) 1000 MG tablet [Pharmacy Med Name: METFORMIN HCL 1,000 MG TABLET] 180 tablet 1    Sig: TAKE 1 TABLET (1,000 MG TOTAL) BY MOUTH TWICE A DAY WITH FOOD     Endocrinology:  Diabetes - Biguanides Failed - 07/08/2023  8:31 AM      Failed - B12 Level in normal range and within 720 days    No results found for: "VITAMINB12"       Failed - CBC within normal limits and completed in the last 12 months    WBC  Date Value Ref Range Status  02/15/2023 7.4 3.4 - 10.8 x10E3/uL Final  02/01/2017 16.8 (H) 3.8 - 10.6 K/uL Final   RBC  Date Value Ref Range Status  02/15/2023 4.62 4.14 - 5.80 x10E6/uL Final  02/01/2017 5.66 4.40 - 5.90 MIL/uL Final   Hemoglobin  Date Value Ref Range Status  02/15/2023 14.4 13.0 - 17.7 g/dL Final   Hematocrit  Date Value Ref Range Status  02/15/2023 42.4 37.5 - 51.0 % Final   MCHC  Date Value Ref Range Status  02/15/2023 34.0 31.5 - 35.7 g/dL Final  16/06/9603 54.0 32.0 - 36.0 g/dL Final   [facility]  Date Value Ref Range Status  02/15/2023 31.2 26.6 - 33.0 pg Final  02/01/2017 30.0 26.0 - 34.0 pg Final   MCV  Date Value Ref Range Status  02/15/2023 92 79 - 97 fL Final  08/05/2013 90 80 - 100 fL Final   No results found for: "PLTCOUNTKUC", "LABPLAT", "POCPLA" RDW  Date Value Ref Range Status  02/15/2023 12.4 11.6 - 15.4 % Final  08/05/2013 12.9 11.5 - 14.5 % Final         Passed - Cr in normal range and within 360 days    Creatinine  Date Value Ref Range Status  08/05/2013 1.11 0.60 - 1.30 mg/dL Final   Creatinine, Ser  Date Value Ref Range Status  02/15/2023 0.94 0.76 - 1.27 mg/dL Final         Passed - HBA1C is between 0 and 7.9 and within 180 days    Hemoglobin A1C  Date Value Ref Range Status  06/16/2023 6.4 (A) 4.0 - 5.6 % Final   Hgb A1c MFr Bld  Date Value Ref Range Status  02/15/2023 7.8 (H) 4.8 - 5.6 % Final    Comment:             Prediabetes: 5.7  - 6.4          Diabetes: >6.4          Glycemic control for adults with diabetes: <7.0          Passed - eGFR in normal range and within 360 days    EGFR (African American)  Date Value Ref Range Status  08/05/2013 >60  Final   GFR calc Af Amer  Date Value Ref Range Status  05/29/2020 112 >59 mL/min/1.73 Final    Comment:    **[facility] currently reports eGFR in compliance with the current**   recommendations of the [employer]. [facility] will   update reporting as new guidelines are published from the NKF-ASN   Task force.    EGFR (Non-African Amer.)  Date Value Ref Range Status  08/05/2013 >60  Final    Comment:    eGFR values <28mL/min/1.73 m2 may be an indication of chronic kidney disease (CKD). Calculated eGFR is useful in patients with stable renal function. The  eGFR calculation will not be reliable in acutely ill patients when serum creatinine is changing rapidly. It is not useful in  patients on dialysis. The eGFR calculation may not be applicable to patients at the low and high extremes of body sizes, pregnant women, and vegetarians.    GFR calc non Af Amer  Date Value Ref Range Status  05/29/2020 97 >59 mL/min/1.73 Final   eGFR  Date Value Ref Range Status  02/15/2023 99 >59 mL/min/1.73 Final         Passed - Valid encounter within last 6 months    Recent Outpatient Visits           3 weeks ago Diabetes mellitus without complication [facility])   [place] [facility] [name], MD   4 months ago Type 2 diabetes mellitus without complication, without long-term current use of insulin (HCC)   [place] [facility] [name], MD   5 months ago Type 2 diabetes mellitus without complication, without long-term current use of insulin [facility])   [place] [facility] [place], [place], PA-C   1 year ago Venous stasis dermatitis of right lower extremity   [place] [facility]  Caro Laroche, DO   1 year ago Lumbar radiculopathy   [place] [facility] [place], Darl Householder, DO       Future Appointments             In 2 months Fisher, Demetrios Isaacs, MD [facility]

## 2023-07-11 ENCOUNTER — Encounter: Payer: Self-pay | Admitting: Family Medicine

## 2023-07-11 DIAGNOSIS — E119 Type 2 diabetes mellitus without complications: Secondary | ICD-10-CM

## 2023-07-13 MED ORDER — TIRZEPATIDE 5 MG/0.5ML ~~LOC~~ SOAJ
5.0000 mg | SUBCUTANEOUS | 0 refills | Status: DC
Start: 2023-07-13 — End: 2023-09-29

## 2023-07-13 NOTE — Addendum Note (Signed)
Addended by: [name] on: 07/13/2023 01:42 PM   Modules accepted: Orders

## 2023-07-16 ENCOUNTER — Other Ambulatory Visit: Payer: Self-pay | Admitting: Family Medicine

## 2023-07-16 DIAGNOSIS — E781 Pure hyperglyceridemia: Secondary | ICD-10-CM

## 2023-09-29 ENCOUNTER — Ambulatory Visit: Payer: BC Managed Care – PPO | Admitting: Family Medicine

## 2023-09-29 VITALS — BP 137/84 | HR 86 | Resp 16 | Ht 70.0 in | Wt 244.0 lb

## 2023-09-29 DIAGNOSIS — Z1211 Encounter for screening for malignant neoplasm of colon: Secondary | ICD-10-CM

## 2023-09-29 DIAGNOSIS — E119 Type 2 diabetes mellitus without complications: Secondary | ICD-10-CM

## 2023-09-29 DIAGNOSIS — Z7985 Long-term (current) use of injectable non-insulin antidiabetic drugs: Secondary | ICD-10-CM

## 2023-09-29 LAB — POCT GLYCOSYLATED HEMOGLOBIN (HGB A1C): Hemoglobin A1C: 5.8 % — AB (ref 4.0–5.6)

## 2023-09-29 MED ORDER — TIRZEPATIDE 5 MG/0.5ML ~~LOC~~ SOAJ
5.0000 mg | SUBCUTANEOUS | 1 refills | Status: DC
Start: 2023-09-29 — End: 2024-05-31

## 2023-09-29 NOTE — Progress Notes (Signed)
 Established patient visit   Patient: Elijah Mora   DOB: Jul 09, 1971   53 y.o. Male  MRN: [id number] Visit Date: 09/29/2023  Today's healthcare provider: Nancyann Perry, MD   Chief Complaint  Patient presents with   Diabetes   Hypertension   Subjective    Diabetes Pertinent negatives for diabetes include no chest pain.  Hypertension Pertinent negatives include no chest pain, palpitations or shortness of breath.    Follow up htn diabetes since starting Mounjaro  in September, has since d/c glipizide . Continues on metformin . Tolerating well no adverse effects.  Lab Results  Component Value Date   HGBA1C 5.8 (A) 09/29/2023   HGBA1C 6.4 (A) 06/16/2023   HGBA1C 7.8 (H) 02/15/2023   Wt Readings from Last 3 Encounters:  09/29/23 244 lb (110.7 kg)  06/16/23 261 lb 1.6 oz (118.4 kg)  02/15/23 252 lb 9.6 oz (114.6 kg)     Medications: Outpatient Medications Prior to Visit  Medication Sig   aspirin 81 MG tablet Take 81 mg by mouth daily.   atorvastatin  (LIPITOR) 40 MG tablet TAKE 1 TABLET (40 MG TOTAL) BY MOUTH DAILY. PLEASE SCHEDULE AN OFFICE VISIT BEFORE ANYMORE REFILLS.   B Complex-C (B-COMPLEX WITH VITAMIN C) tablet Take 1 tablet by mouth daily.   Blood Glucose Monitoring Suppl (ONE TOUCH ULTRA 2) w/Device KIT Use to check sugar daily for type 2 diabetes E11.9   cetirizine (ZYRTEC) 10 MG tablet Take 10 mg by mouth daily.   cyclobenzaprine  (FLEXERIL ) 10 MG tablet Take 1 tablet by mouth 2 (two) times daily as needed.   escitalopram  (LEXAPRO ) 10 MG tablet TAKE 1 TABLET BY MOUTH EVERY DAY   fluticasone (FLONASE) 50 MCG/ACT nasal spray Place 1 spray into both nostrils daily.   glucose blood (ONETOUCH ULTRA) test strip Use to check sugar daily for type 2 diabetes E11.9   ibuprofen  (ADVIL ,MOTRIN ) 800 MG tablet Take 1 tablet (800 mg total) by mouth every 8 (eight) hours as needed.   Lancets (ONETOUCH ULTRASOFT) lancets Use to check blood sugar daily for type 2 diabetes  E11.9   metFORMIN  (GLUCOPHAGE ) 1000 MG tablet TAKE 1 TABLET (1,000 MG TOTAL) BY MOUTH TWICE A DAY WITH FOOD   triamcinolone  ointment (KENALOG ) 0.5 % Apply 1 Application topically 2 (two) times daily.   valsartan -hydrochlorothiazide  (DIOVAN -HCT) 80-12.5 MG tablet TAKE 1 TABLET BY MOUTH EVERY DAY   [DISCONTINUED] tirzepatide  (MOUNJARO ) 5 MG/0.5ML Pen Inject 5 mg into the skin once a week.   No facility-administered medications prior to visit.    Review of Systems  Constitutional:  Negative for appetite change, chills and fever.  Respiratory:  Negative for chest tightness, shortness of breath and wheezing.   Cardiovascular:  Negative for chest pain and palpitations.  Gastrointestinal:  Negative for abdominal pain, nausea and vomiting.       Objective    BP 137/84   Pulse 86   Resp 16   Ht 5' 10 (1.778 m)   Wt 244 lb (110.7 kg)   SpO2 100%   BMI 35.01 kg/m    Physical Exam  Awake, alert, oriented x 3. In no apparent distress   Results for orders placed or performed in visit on 09/29/23  POCT glycosylated hemoglobin (Hb A1C)  Result Value Ref Range   Hemoglobin A1C 5.8 (A) 4.0 - 5.6 %    Assessment & Plan     1. Type 2 diabetes mellitus without complication, without long-term current use of insulin (HCC) (Primary) Doing very well  since starting mounjaro  in September and stopping glipizide .  refill tirzepatide  (MOUNJARO ) 5 MG/0.5ML Pen; Inject 5 mg into the skin once a week.  Dispense: 6 mL; Refill: 1  Continue current medications.    2. Morbid obesity (HCC) Doing well with diet and exercise, 17# weight loss since last visit in September.   3. Colon cancer screening  - Ambulatory referral to gastroenterology for colonoscopy  Return in about 4 months (around 01/27/2024) for Yearly Physical.         Nancyann Perry, MD  [facility] [phone] (phone) [phone] (fax)  [facility]

## 2023-09-29 NOTE — Patient Instructions (Addendum)
Please review the attached list of medications and notify my office if there are any errors.  ° °Call  GI at [phone]    °

## 2023-10-03 ENCOUNTER — Telehealth: Payer: Self-pay

## 2023-10-03 ENCOUNTER — Other Ambulatory Visit: Payer: Self-pay

## 2023-10-03 DIAGNOSIS — Z1211 Encounter for screening for malignant neoplasm of colon: Secondary | ICD-10-CM

## 2023-10-03 MED ORDER — NA SULFATE-K SULFATE-MG SULF 17.5-3.13-1.6 GM/177ML PO SOLN: 1.0000 | Freq: Once | ORAL | 0 refills | Status: AC

## 2023-10-03 NOTE — Telephone Encounter (Signed)
 Gastroenterology Pre-Procedure Review  Request Date: 11/03/23 Requesting Physician: Dr. Therisa  PATIENT REVIEW QUESTIONS: The patient responded to the following health history questions as indicated:    1. Are you having any GI issues? no 2. Do you have a personal history of Polyps? no 3. Do you have a family history of Colon Cancer or Polyps? no 4. Diabetes Mellitus? yes (takes mounjaro  and metformin  has been advised and noted on instructions to stop mounjaro  7 days prior to colonoscopy and stop metformin  2 days prior) 5. Joint replacements in the past 12 months?no 6. Major health problems in the past 3 months?no 7. Any artificial heart valves, MVP, or defibrillator?no    MEDICATIONS & ALLERGIES:    Patient reports the following regarding taking any anticoagulation/antiplatelet therapy:   Plavix, Coumadin, Eliquis, Xarelto, Lovenox, Pradaxa, Brilinta, or Effient? no Aspirin? no  Patient confirms/reports the following medications:  Current Outpatient Medications  Medication Sig Dispense Refill   aspirin 81 MG tablet Take 81 mg by mouth daily.     atorvastatin  (LIPITOR) 40 MG tablet TAKE 1 TABLET (40 MG TOTAL) BY MOUTH DAILY. PLEASE SCHEDULE AN OFFICE VISIT BEFORE ANYMORE REFILLS. 90 tablet 0   B Complex-C (B-COMPLEX WITH VITAMIN C) tablet Take 1 tablet by mouth daily.     Blood Glucose Monitoring Suppl (ONE TOUCH ULTRA 2) w/Device KIT Use to check sugar daily for type 2 diabetes E11.9 1 kit 0   cetirizine (ZYRTEC) 10 MG tablet Take 10 mg by mouth daily.     cyclobenzaprine  (FLEXERIL ) 10 MG tablet Take 1 tablet by mouth 2 (two) times daily as needed.     escitalopram  (LEXAPRO ) 10 MG tablet TAKE 1 TABLET BY MOUTH EVERY DAY 90 tablet 1   fluticasone (FLONASE) 50 MCG/ACT nasal spray Place 1 spray into both nostrils daily.     glucose blood (ONETOUCH ULTRA) test strip Use to check sugar daily for type 2 diabetes E11.9 100 each 4   ibuprofen  (ADVIL ,MOTRIN ) 800 MG tablet Take 1 tablet (800 mg  total) by mouth every 8 (eight) hours as needed. 90 tablet 0   Lancets (ONETOUCH ULTRASOFT) lancets Use to check blood sugar daily for type 2 diabetes E11.9 100 each 4   metFORMIN  (GLUCOPHAGE ) 1000 MG tablet TAKE 1 TABLET (1,000 MG TOTAL) BY MOUTH TWICE A DAY WITH FOOD 180 tablet 1   tirzepatide  (MOUNJARO ) 5 MG/0.5ML Pen Inject 5 mg into the skin once a week. 6 mL 1   triamcinolone  ointment (KENALOG ) 0.5 % Apply 1 Application topically 2 (two) times daily. 60 g 3   valsartan -hydrochlorothiazide  (DIOVAN -HCT) 80-12.5 MG tablet TAKE 1 TABLET BY MOUTH EVERY DAY 90 tablet 0   No current facility-administered medications for this visit.    Patient confirms/reports the following allergies:  No Known Allergies  No orders of the defined types were placed in this encounter.   AUTHORIZATION INFORMATION Primary Insurance: 1D#: Group #:  Secondary Insurance: 1D#: Group #:  SCHEDULE INFORMATION: Date: 11/03/23 Time: Location: [facility]

## 2023-10-05 ENCOUNTER — Other Ambulatory Visit: Payer: Self-pay | Admitting: Family Medicine

## 2023-10-05 DIAGNOSIS — F419 Anxiety disorder, unspecified: Secondary | ICD-10-CM

## 2023-10-05 DIAGNOSIS — I10 Essential (primary) hypertension: Secondary | ICD-10-CM

## 2023-10-13 ENCOUNTER — Other Ambulatory Visit: Payer: Self-pay | Admitting: Family Medicine

## 2023-10-13 DIAGNOSIS — E781 Pure hyperglyceridemia: Secondary | ICD-10-CM

## 2023-10-13 NOTE — Telephone Encounter (Signed)
Requested Prescriptions  Pending Prescriptions Disp Refills   atorvastatin (LIPITOR) 40 MG tablet [Pharmacy Med Name: ATORVASTATIN 40 MG TABLET] 90 tablet 1    Sig: TAKE 1 TABLET (40 MG TOTAL) BY MOUTH DAILY. PLEASE SCHEDULE AN OFFICE VISIT BEFORE ANYMORE REFILLS.     Cardiovascular:  Antilipid - Statins Failed - 10/13/2023  8:26 AM      Failed - Lipid Panel in normal range within the last 12 months    Cholesterol, Total  Date Value Ref Range Status  02/15/2023 102 100 - 199 mg/dL Final   LDL Chol Calc (NIH)  Date Value Ref Range Status  02/15/2023 39 0 - 99 mg/dL Final   HDL  Date Value Ref Range Status  02/15/2023 28 (L) >39 mg/dL Final   Triglycerides  Date Value Ref Range Status  02/15/2023 222 (H) 0 - 149 mg/dL Final         Passed - Patient is not pregnant      Passed - Valid encounter within last 12 months    Recent Outpatient Visits           2 weeks ago Type 2 diabetes mellitus without complication, without long-term current use of insulin (HCC)   [place] [facility] [name], MD   3 months ago Diabetes mellitus without complication [facility])   [place] [facility] [name], MD   8 months ago Type 2 diabetes mellitus without complication, without long-term current use of insulin (HCC)   [place] [facility] [name], MD   9 months ago Type 2 diabetes mellitus without complication, without long-term current use of insulin [facility])   [place] [facility] [place], Binford, PA-C   1 year ago Venous stasis dermatitis of right lower extremity   [facility] [place], Darl Householder, DO       Future Appointments             In 3 months Fisher, Demetrios Isaacs, MD [facility]

## 2023-10-23 ENCOUNTER — Telehealth: Payer: Self-pay | Admitting: *Deleted

## 2023-10-23 MED ORDER — NA SULFATE-K SULFATE-MG SULF 17.5-3.13-1.6 GM/177ML PO SOLN: 1.0000 | Freq: Once | ORAL | 0 refills | Status: AC

## 2023-10-23 NOTE — Telephone Encounter (Signed)
Patient was transferred from the endo unit. Patient needs to reschedule colonoscopy due to death in the family.  Requesting to reschedule to 12/15/2023.  New instructions will be sent. Rx for suprep have been resent to the pharmacy.

## 2023-11-01 LAB — HM DIABETES EYE EXAM

## 2023-12-21 ENCOUNTER — Encounter: Payer: Self-pay | Admitting: Gastroenterology

## 2023-12-22 ENCOUNTER — Ambulatory Visit: Admitting: Anesthesiology

## 2023-12-22 ENCOUNTER — Ambulatory Visit
Admission: RE | Admit: 2023-12-22 | Discharge: 2023-12-22 | Disposition: A | Discharge status: Home / Self Care | Payer: BC Managed Care – PPO | Attending: Gastroenterology | Admitting: Gastroenterology

## 2023-12-22 ENCOUNTER — Encounter
Admission: RE | Disposition: A | Discharge status: Home / Self Care | Payer: Self-pay | Source: Home / Self Care | Attending: Gastroenterology

## 2023-12-22 DIAGNOSIS — Z79899 Other long term (current) drug therapy: Secondary | ICD-10-CM | POA: Insufficient documentation

## 2023-12-22 DIAGNOSIS — E119 Type 2 diabetes mellitus without complications: Secondary | ICD-10-CM | POA: Diagnosis not present

## 2023-12-22 DIAGNOSIS — K621 Rectal polyp: Secondary | ICD-10-CM | POA: Diagnosis not present

## 2023-12-22 DIAGNOSIS — D126 Benign neoplasm of colon, unspecified: Secondary | ICD-10-CM

## 2023-12-22 DIAGNOSIS — K219 Gastro-esophageal reflux disease without esophagitis: Secondary | ICD-10-CM | POA: Diagnosis not present

## 2023-12-22 DIAGNOSIS — Z7984 Long term (current) use of oral hypoglycemic drugs: Secondary | ICD-10-CM | POA: Diagnosis not present

## 2023-12-22 DIAGNOSIS — I10 Essential (primary) hypertension: Secondary | ICD-10-CM | POA: Diagnosis not present

## 2023-12-22 DIAGNOSIS — D120 Benign neoplasm of cecum: Secondary | ICD-10-CM | POA: Diagnosis not present

## 2023-12-22 DIAGNOSIS — Z7985 Long-term (current) use of injectable non-insulin antidiabetic drugs: Secondary | ICD-10-CM | POA: Insufficient documentation

## 2023-12-22 DIAGNOSIS — Z1211 Encounter for screening for malignant neoplasm of colon: Secondary | ICD-10-CM

## 2023-12-22 DIAGNOSIS — Z833 Family history of diabetes mellitus: Secondary | ICD-10-CM | POA: Diagnosis not present

## 2023-12-22 DIAGNOSIS — K635 Polyp of colon: Secondary | ICD-10-CM | POA: Diagnosis not present

## 2023-12-22 DIAGNOSIS — Z7982 Long term (current) use of aspirin: Secondary | ICD-10-CM | POA: Insufficient documentation

## 2023-12-22 HISTORY — PX: COLONOSCOPY WITH PROPOFOL: SHX5780

## 2023-12-22 HISTORY — DX: Type 2 diabetes mellitus without complications: E11.9

## 2023-12-22 HISTORY — PX: POLYPECTOMY: SHX5525

## 2023-12-22 LAB — GLUCOSE, CAPILLARY: Glucose-Capillary: 121 mg/dL — ABNORMAL HIGH (ref 70–99)

## 2023-12-22 SURGERY — COLONOSCOPY WITH PROPOFOL
Anesthesia: General

## 2023-12-22 MED ORDER — LIDOCAINE HCL (CARDIAC) PF 100 MG/5ML IV SOSY
PREFILLED_SYRINGE | INTRAVENOUS | Status: DC | PRN
  Administered 2023-12-22: 100 mg via INTRAVENOUS

## 2023-12-22 MED ORDER — SODIUM CHLORIDE 0.9 % IV SOLN
INTRAVENOUS | Status: DC
  Administered 2023-12-22: 20 mL/h via INTRAVENOUS

## 2023-12-22 MED ORDER — PROPOFOL 500 MG/50ML IV EMUL
INTRAVENOUS | Status: DC | PRN
  Administered 2023-12-22: 50 mg via INTRAVENOUS
  Administered 2023-12-22: 150 ug/kg/min via INTRAVENOUS

## 2023-12-22 MED ORDER — PROPOFOL 1000 MG/100ML IV EMUL
INTRAVENOUS | Status: AC
  Filled 2023-12-22: qty 100

## 2023-12-22 NOTE — Op Note (Signed)
 [facility] Gastroenterology Patient Name: Elijah Mora Procedure Date: 12/22/2023 7:23 AM MRN: [id number] Account #: [id number] Date of Birth: 01-01-1971 Admit Type: Outpatient Age: 53 Room: [facility] ENDO ROOM 4 Gender: Male Note Status: Finalized Instrument Name: Nelda Marseille [id number] Procedure:             Colonoscopy Indications:           Screening for colorectal malignant neoplasm Providers:             [name] MD, MD Referring MD:          Demetrios Isaacs. Sherrie Mustache, MD (Referring MD) Medicines:             Monitored Anesthesia Care Complications:         No immediate complications. Procedure:             Pre-Anesthesia Assessment:                        - Prior to the procedure, a History and Physical was                         performed, and patient medications, allergies and                         sensitivities were reviewed. The patient's tolerance                         of previous anesthesia was reviewed.                        - The risks and benefits of the procedure and the                         sedation options and risks were discussed with the                         patient. All questions were answered and informed                         consent was obtained.                        - ASA Grade Assessment: II - A patient with mild                         systemic disease.                        After obtaining informed consent, the colonoscope was                         passed under direct vision. Throughout the procedure,                         the patient's blood pressure, pulse, and oxygen                         saturations were monitored continuously. The                         Colonoscope was  introduced through the anus and                         advanced to the the cecum, identified by the                         appendiceal orifice. The colonoscopy was performed                         with ease. The patient tolerated the procedure  well.                         The quality of the bowel preparation was excellent.                         The ileocecal valve, appendiceal orifice, and rectum                         were photographed. Findings:      The perianal and digital rectal examinations were normal.      Two sessile polyps were found in the rectum and cecum. The polyps were 4       to 5 mm in size. These polyps were removed with a cold snare. Resection       and retrieval were complete.      The exam was otherwise without abnormality on direct and retroflexion       views. Impression:            - Two 4 to 5 mm polyps in the rectum and in the cecum,                         removed with a cold snare. Resected and retrieved.                        - The examination was otherwise normal on direct and                         retroflexion views. Recommendation:        - Discharge patient to home (with escort).                        - Resume previous diet.                        - Continue present medications.                        - Await pathology results.                        - Repeat colonoscopy in 7-10 years for surveillance                         based on pathology results. Procedure Code(s):     --- Professional ---                        [phone], Colonoscopy, flexible; with removal of  tumor(s), polyp(s), or other lesion(s) by snare                         technique Diagnosis Code(s):     --- Professional ---                        Z12.11, Encounter for screening for malignant neoplasm                         of colon                        D12.8, Benign neoplasm of rectum                        D12.0, Benign neoplasm of cecum CPT copyright 2022 American Medical Association. All rights reserved. The codes documented in this report are preliminary and upon coder review may  be revised to meet current compliance requirements. [name], MD [name] MD, MD 12/22/2023 8:12:25  AM This report has been signed electronically. Number of Addenda: 0 Note Initiated On: 12/22/2023 7:23 AM Scope Withdrawal Time: 0 hours 11 minutes 17 seconds  Total Procedure Duration: 0 hours 13 minutes 52 seconds  Estimated Blood Loss:  Estimated blood loss: none.      [place] [facility]

## 2023-12-22 NOTE — Anesthesia Preprocedure Evaluation (Signed)
 Anesthesia Evaluation  Patient identified by MRN, date of birth, ID band Patient awake    Reviewed: Allergy & Precautions, NPO status , Patient's Chart, lab work & pertinent test results  History of Anesthesia Complications Negative for: history of anesthetic complications  Airway Mallampati: III  TM Distance: <3 FB Neck ROM: full    Dental  (+) Chipped   Pulmonary neg shortness of breath, sleep apnea and Continuous Positive Airway Pressure Ventilation    Pulmonary exam normal        Cardiovascular Exercise Tolerance: Good hypertension, (-) angina Normal cardiovascular exam     Neuro/Psych  Neuromuscular disease  negative psych ROS   GI/Hepatic Neg liver ROS,GERD  Controlled,,  Endo/Other  negative endocrine ROSdiabetes, Type 2    Renal/GU negative Renal ROS  negative genitourinary   Musculoskeletal   Abdominal   Peds  Hematology negative hematology ROS (+)   Anesthesia Other Findings Past Medical History: No date: Anxiety No date: Cancer [facility])     Comment:  SYNOVIAL CARCINOMA No date: Diabetes mellitus without complication (HCC) No date: GERD (gastroesophageal reflux disease)     Comment:  OCC No date: History of kidney stones 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 No date: Sleep apnea     Comment:  CPAP  Past Surgical History: 02/10/2017: CYSTOSCOPY WITH STENT PLACEMENT; Right     Comment:  Procedure: CYSTOSCOPY WITH STENT PLACEMENT;  Surgeon:               [name], MD;  Location: [facility] ORS;  Service:               Urology;  Laterality: Right; 02/02/2017: EXTRACORPOREAL SHOCK WAVE LITHOTRIPSY; Right     Comment:  Procedure: EXTRACORPOREAL SHOCK WAVE LITHOTRIPSY (ESWL);              Surgeon: [name], MD;  Location: [facility] ORS;                Service: Urology;  Laterality: Right; 04/18/2016: FEMORAL BYPASS; 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 Dr. 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 Off Chemo 06/1999 02/10/2017: URETEROSCOPY WITH HOLMIUM LASER LITHOTRIPSY; Right     Comment:  Procedure: URETEROSCOPY WITH HOLMIUM LASER LITHOTRIPSY;               Surgeon: [name], MD;  Location: [facility] ORS;                Service: Urology;  Laterality: Right;  BMI    Body Mass Index: 33.66 kg/m      Reproductive/Obstetrics negative OB ROS                             Anesthesia Physical Anesthesia Plan  ASA: 3  Anesthesia Plan: General   Post-op Pain Management:    Induction: Intravenous  PONV Risk Score and Plan: Propofol infusion and TIVA  Airway Management Planned: Natural Airway and Nasal Cannula  Additional Equipment:   Intra-op Plan:   Post-operative Plan:   Informed Consent: I have reviewed the patients History and Physical, chart, labs and discussed the procedure including the risks, benefits and alternatives for the proposed anesthesia with the patient or authorized representative who has  indicated his/her understanding and acceptance.     Dental Advisory Given  Plan Discussed with: Anesthesiologist, CRNA and Surgeon  Anesthesia Plan Comments: (Patient consented for risks of anesthesia including but not limited to:  - adverse reactions to medications - risk of airway placement if required - damage to eyes, teeth, lips or other oral mucosa - nerve damage due to positioning  - sore throat or hoarseness - Damage to heart, brain, nerves, lungs, other parts of body or loss of life  Patient voiced understanding and assent.)       Anesthesia Quick Evaluation

## 2023-12-22 NOTE — Transfer of Care (Signed)
 Immediate Anesthesia Transfer of Care Note  Patient: Elijah Mora  Procedure(s) Performed: COLONOSCOPY WITH PROPOFOL POLYPECTOMY  Patient Location: PACU  Anesthesia Type:General  Level of Consciousness: awake and patient cooperative  Airway & Oxygen Therapy: Patient Spontanous Breathing  Post-op Assessment: Report given to RN and Post -op Vital signs reviewed and stable  Post vital signs: stable  Last Vitals:  Vitals Value Taken Time  BP 99/58 12/22/23 0815  Temp 36.1 C 12/22/23 0814  Pulse 94 12/22/23 0815  Resp 18 12/22/23 0815  SpO2 95 % 12/22/23 0815  Vitals shown include unfiled device data.  Last Pain:  Vitals:   12/22/23 0814  TempSrc: Temporal  PainSc: Asleep         Complications: No notable events documented.

## 2023-12-22 NOTE — Anesthesia Postprocedure Evaluation (Signed)
 Anesthesia Post Note  Patient: Elijah Mora  Procedure(s) Performed: COLONOSCOPY WITH PROPOFOL POLYPECTOMY  Patient location during evaluation: Endoscopy Anesthesia Type: General Level of consciousness: awake and alert Pain management: pain level controlled Vital Signs Assessment: post-procedure vital signs reviewed and stable Respiratory status: spontaneous breathing, nonlabored ventilation, respiratory function stable and patient connected to nasal cannula oxygen Cardiovascular status: blood pressure returned to baseline and stable Postop Assessment: no apparent nausea or vomiting Anesthetic complications: no   No notable events documented.   Last Vitals:  Vitals:   12/22/23 0650 12/22/23 0814  BP: 128/86   Pulse: 93   Resp: 20   Temp: (!) 35.9 C (!) 36.1 C  SpO2: 99%     Last Pain:  Vitals:   12/22/23 0834  TempSrc:   PainSc: 0-No pain                 Cleda Mccreedy Ulises Wolfinger

## 2023-12-22 NOTE — H&P (Signed)
 Elijah Mood, MD [address], [address], [place], [place], [address] [address], [address], [place], [place], [address] Phone: [phone]  Fax: [phone]  Primary Care Physician:  [name], MD   Pre-Procedure History & Physical: HPI:  Elijah Mora is a 53 y.o. male is here for an colonoscopy.   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   Sleep apnea    CPAP    Past Surgical History:  Procedure Laterality Date   CYSTOSCOPY WITH STENT PLACEMENT Right 02/10/2017   Procedure: CYSTOSCOPY WITH STENT PLACEMENT;  Surgeon: [name], MD;  Location: [facility] ORS;  Service: Urology;  Laterality: Right;   EXTRACORPOREAL SHOCK WAVE LITHOTRIPSY Right 02/02/2017   Procedure: EXTRACORPOREAL SHOCK WAVE LITHOTRIPSY (ESWL);  Surgeon: [name], MD;  Location: [facility] ORS;  Service: Urology;  Laterality: Right;   FEMORAL BYPASS Right 04/18/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 Dr. [name]   KIDNEY STONE SURGERY     Synovial cancer     behind right knee. Off Chemo 06/1999   URETEROSCOPY WITH HOLMIUM LASER LITHOTRIPSY Right 02/10/2017   Procedure: URETEROSCOPY WITH HOLMIUM LASER LITHOTRIPSY;  Surgeon: [name], MD;  Location: [facility] ORS;  Service: Urology;  Laterality: Right;    Prior to Admission medications   Medication Sig Start Date End Date Taking? Authorizing Provider  aspirin 81 MG tablet Take 81 mg by mouth daily.   Yes [provider]  atorvastatin (LIPITOR) 40 MG tablet TAKE 1 TABLET (40 MG TOTAL) BY MOUTH DAILY. PLEASE SCHEDULE AN OFFICE VISIT BEFORE ANYMORE REFILLS. 10/13/23  Yes [name], MD  B Complex-C (B-COMPLEX WITH VITAMIN C) tablet Take 1 tablet by mouth daily.   Yes [provider]  Blood Glucose Monitoring Suppl (ONE TOUCH ULTRA 2) w/Device KIT Use to check sugar daily for type 2 diabetes E11.9 02/07/20  Yes [name], MD  cetirizine (ZYRTEC) 10 MG tablet Take 10 mg by mouth daily.   Yes [provider]  cyclobenzaprine (FLEXERIL) 10 MG tablet Take 1 tablet by mouth 2 (two) times daily as needed. 06/08/19  Yes [provider]  escitalopram (LEXAPRO) 10 MG tablet TAKE 1 TABLET BY MOUTH EVERY DAY 10/06/23  Yes [name], MD  fluticasone (FLONASE) 50 MCG/ACT nasal spray Place 1 spray into both nostrils daily.   Yes [provider]  glucose blood (ONETOUCH ULTRA) test strip Use to check sugar daily for type 2 diabetes E11.9 02/15/23  Yes [name], MD  ibuprofen (ADVIL,MOTRIN) 800 MG tablet Take 1 tablet (800 mg total) by mouth every 8 (eight) hours as needed. 12/31/15  Yes [name] M, PA-C  Lancets [facility] ULTRASOFT) lancets Use to check blood sugar daily for type 2 diabetes E11.9 02/07/20  Yes [name], MD  metFORMIN (GLUCOPHAGE) 1000 MG tablet TAKE 1 TABLET (1,000 MG TOTAL) BY MOUTH TWICE A DAY WITH FOOD 07/10/23  Yes [name], MD  tirzepatide [facility]) 5 MG/0.5ML Pen Inject 5 mg into the skin once a week. 09/29/23  Yes [name], MD  triamcinolone ointment (KENALOG) 0.5 % APPLY TO AFFECTED AREA TWICE A DAY 10/06/23  Yes [name]  E, MD  valsartan-hydrochlorothiazide (DIOVAN-HCT) 80-12.5 MG tablet TAKE 1 TABLET BY MOUTH EVERY DAY 10/06/23  Yes [name], MD    Allergies as of 10/03/2023   (No Known Allergies)    Family History  Problem Relation Age of Onset   Cancer Mother 42       Breast   Kidney Stones Mother    Diabetes Father    Hypertension Father    CVA Father    Breast cancer Maternal Grandmother    Lymphoma Maternal Grandfather    Kidney Stones Other    Colon cancer Neg Hx    Prostate cancer Neg Hx     Social History   Socioeconomic History   Marital  status: Married    Spouse name: Not on file   Number of children: Not on file   Years of education: Not on file   Highest education level: Bachelor's degree (e.g., BA, AB, BS)  Occupational History   Occupation: Works in Presenter, broadcasting with [employer]  Tobacco Use   Smoking status: Never   Smokeless tobacco: Never  Vaping Use   Vaping status: Never Used  Substance and Sexual Activity   Alcohol use: Yes    Alcohol/week: 4.0 standard drinks of alcohol    Types: 4 Cans of beer per week    Comment: occasional use   Drug use: No   Sexual activity: Not on file  Other Topics Concern   Not on file  Social History Narrative   Not on file   Social Drivers of Health   Financial Resource Strain: Low Risk  (02/13/2023)   Overall Financial Resource Strain (CARDIA)    Difficulty of Paying Living Expenses: Not hard at all  Food Insecurity: No Food Insecurity (02/13/2023)   Hunger Vital Sign    Worried About Running Out of Food in the Last Year: Never true    Ran Out of Food in the Last Year: Never true  Transportation Needs: No Transportation Needs (02/13/2023)   PRAPARE - Administrator, Civil Service (Medical): No    Lack of Transportation (Non-Medical): No  Physical Activity: Insufficiently Active (02/13/2023)   Exercise Vital Sign    Days of Exercise per Week: 2 days    Minutes of Exercise per Session: 30 min  Stress: No Stress Concern Present (02/13/2023)   [employer] of Occupational Health - Occupational Stress Questionnaire    Feeling of Stress : Only a little  Social Connections: Socially Integrated (02/13/2023)   Social Connection and Isolation Panel [NHANES]    Frequency of Communication with Friends and Family: Twice a week    Frequency of Social Gatherings with Friends and Family: Once a week    Attends Religious Services: More than 4 times per year    Active Member of [employer] or Organizations: Yes    Attends Engineer, structural: More than 4  times per year    Marital Status: Married  Catering manager Violence: Not on file    Review of Systems: See HPI, otherwise negative ROS  Physical Exam: BP 128/86   Pulse 93   Temp (!) 96.7 F (35.9 C) (Temporal)   Resp 20   Ht 5\' 10"  (1.778 m)   Wt 106.4 kg   SpO2 99%   BMI 33.66 kg/m  General:   Alert,  pleasant and cooperative in NAD Head:  Normocephalic and atraumatic. Neck:  Supple; no masses or thyromegaly. Lungs:  Clear throughout to auscultation, normal respiratory effort.  Heart:  +S1, +S2, Regular rate and rhythm, No edema. Abdomen:  Soft, nontender and nondistended. Normal bowel sounds, without guarding, and without rebound.   Neurologic:  Alert and  oriented x4;  grossly normal neurologically.  Impression/Plan: BAINE DECESARE is here for an colonoscopy to be performed for Screening colonoscopy average risk   Risks, benefits, limitations, and alternatives regarding  colonoscopy have been reviewed with the patient.  Questions have been answered.  All parties agreeable.   Elijah Mood, MD  12/22/2023, 7:51 AM

## 2023-12-25 ENCOUNTER — Encounter: Payer: Self-pay | Admitting: Gastroenterology

## 2023-12-25 LAB — SURGICAL PATHOLOGY

## 2023-12-26 ENCOUNTER — Encounter: Payer: Self-pay | Admitting: Gastroenterology

## 2023-12-31 ENCOUNTER — Other Ambulatory Visit: Payer: Self-pay | Admitting: Family Medicine

## 2023-12-31 DIAGNOSIS — I10 Essential (primary) hypertension: Secondary | ICD-10-CM

## 2023-12-31 DIAGNOSIS — E119 Type 2 diabetes mellitus without complications: Secondary | ICD-10-CM

## 2024-01-29 ENCOUNTER — Ambulatory Visit: Payer: Self-pay | Admitting: Family Medicine

## 2024-01-29 ENCOUNTER — Encounter: Payer: Self-pay | Admitting: Family Medicine

## 2024-01-29 VITALS — BP 128/79 | HR 91 | Resp 16 | Ht 70.0 in | Wt 239.1 lb

## 2024-01-29 DIAGNOSIS — E119 Type 2 diabetes mellitus without complications: Secondary | ICD-10-CM | POA: Diagnosis not present

## 2024-01-29 DIAGNOSIS — Z0000 Encounter for general adult medical examination without abnormal findings: Secondary | ICD-10-CM

## 2024-01-29 DIAGNOSIS — Z0001 Encounter for general adult medical examination with abnormal findings: Secondary | ICD-10-CM

## 2024-01-29 DIAGNOSIS — E1159 Type 2 diabetes mellitus with other circulatory complications: Secondary | ICD-10-CM | POA: Diagnosis not present

## 2024-01-29 DIAGNOSIS — E781 Pure hyperglyceridemia: Secondary | ICD-10-CM | POA: Diagnosis not present

## 2024-01-29 DIAGNOSIS — I10 Essential (primary) hypertension: Secondary | ICD-10-CM | POA: Diagnosis not present

## 2024-01-29 DIAGNOSIS — Z125 Encounter for screening for malignant neoplasm of prostate: Secondary | ICD-10-CM

## 2024-01-29 DIAGNOSIS — Z860101 Personal history of adenomatous and serrated colon polyps: Secondary | ICD-10-CM

## 2024-01-29 NOTE — Progress Notes (Signed)
 Complete physical exam   Patient: Elijah Mora   DOB: 02-18-1971   53 y.o. Male  MRN: [id number] Visit Date: 01/29/2024  Today's healthcare provider: Jeralene Mom, MD   Chief Complaint  Patient presents with   Annual Exam    Patient reports overall doing well.  Declined shingles and Prevnar vaccine   Subjective    Discussed the use of AI scribe software for clinical note transcription with the patient, who gave verbal consent to proceed.  History of Present Illness   Elijah Mora "[name]" is a 53 year old male who presents for an annual physical exam.  He notes prolonged recovery from exercise, attributing it to aging. He maintains a routine of exercising a couple of times a week and staying active during the day, such as working in the yard.  His blood sugar levels have increased slightly, which he attributes to dietary changes over Easter. His blood sugar has been running around 138 mg/dL, with a peak at 536 mg/dL. He is currently taking Mounjaro , with two months left of the 5 mg dose, and notes that his appetite has returned. He experiences looser stools a day or two after taking the medication but no other significant gastrointestinal issues.  No chest pain, heart flutter, or shortness of breath. He also reports no issues with hearing or ringing in the ears, and his last eye exam in mid-February was normal. He occasionally consumes alcohol but does not have a daily habit.     Lab Results  Component Value Date   HGBA1C 5.8 (A) 09/29/2023   HGBA1C 6.4 (A) 06/16/2023   HGBA1C 7.8 (H) 02/15/2023   Wt Readings from Last 5 Encounters:  01/29/24 239 lb 1.6 oz (108.5 kg)  12/22/23 234 lb 9.6 oz (106.4 kg)  09/29/23 244 lb (110.7 kg)  06/16/23 261 lb 1.6 oz (118.4 kg)  02/15/23 252 lb 9.6 oz (114.6 kg)   Lab Results  Component Value Date   NA 142 02/15/2023   K 4.3 02/15/2023   CREATININE 0.94 02/15/2023   EGFR 99 02/15/2023   GLUCOSE 148 (H) 02/15/2023    Lab Results  Component Value Date   CHOL 102 02/15/2023   HDL 28 (L) 02/15/2023   LDLCALC 39 02/15/2023   TRIG 222 (H) 02/15/2023   CHOLHDL 3.6 02/15/2023       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   Sleep apnea    CPAP   Past Surgical History:  Procedure Laterality Date   COLONOSCOPY WITH PROPOFOL  N/A 12/22/2023   Procedure: COLONOSCOPY WITH PROPOFOL ;  Surgeon: Luke Salaam, MD;  Location: [facility] ENDOSCOPY;  Service: Gastroenterology;  Laterality: N/A;  DM, REQUEST 1ST CASE   CYSTOSCOPY WITH STENT PLACEMENT Right 02/10/2017   Procedure: CYSTOSCOPY WITH STENT PLACEMENT;  Surgeon: Bart Born, MD;  Location: [facility] ORS;  Service: Urology;  Laterality: Right;   EXTRACORPOREAL SHOCK WAVE LITHOTRIPSY Right 02/02/2017   Procedure: EXTRACORPOREAL SHOCK WAVE LITHOTRIPSY (ESWL);  Surgeon: Dustin Gimenez, MD;  Location: [facility] ORS;  Service: Urology;  Laterality: Right;   FEMORAL BYPASS Right 04/18/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 Dr. Ivor Mars   KIDNEY STONE SURGERY     POLYPECTOMY  12/22/2023   Procedure: POLYPECTOMY;  Surgeon: Luke Salaam, MD;  Location: [facility] ENDOSCOPY;  Service: Gastroenterology;;   Synovial cancer     behind right knee. 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[place]  [employer]  Tobacco Use   Smoking status: Never   Smokeless tobacco: Never  Vaping Use   Vaping status: Never Used  Substance and Sexual Activity   Alcohol use: Yes    Alcohol/week: 4.0 standard drinks of alcohol    Types: 4 Cans of beer per week    Comment: occasional use   Drug use: No   Sexual activity: Not on file  Other Topics Concern   Not on file  Social History Narrative   Not on file   Social Drivers of Health   Financial Resource Strain: Low Risk  (01/29/2024)   Overall Financial Resource Strain (CARDIA)    Difficulty of Paying Living Expenses: Not hard at all  Food Insecurity: No Food Insecurity (01/29/2024)   Hunger Vital Sign    Worried About Running Out of Food in the Last Year: Never true    Ran Out of Food in the Last Year: Never true  Transportation Needs: No Transportation Needs (01/29/2024)   PRAPARE - Administrator, Civil Service (Medical): No    Lack of Transportation (Non-Medical): No  Physical Activity: Insufficiently Active (01/29/2024)   Exercise Vital Sign    Days of Exercise per Week: 2 days    Minutes of Exercise per Session: 30 min  Stress: No Stress Concern Present (01/29/2024)   [employer] of Occupational Health - Occupational Stress Questionnaire    Feeling of Stress : Only a little  Social Connections: Socially Integrated (01/29/2024)   Social Connection and Isolation Panel [NHANES]    Frequency of Communication with Friends and Family: Twice a week    Frequency of Social Gatherings with Friends and Family: Once a week    Attends Religious Services: More than 4 times per year    Active Member of [employer] or Organizations: Yes    Attends Banker Meetings: More than 4 times per year    Marital Status: Married  Catering manager Violence: Not At Risk (01/29/2024)   Humiliation, Afraid, Rape, and Kick questionnaire    Fear of Current or Ex-Partner: No    Emotionally Abused: No     Physically Abused: No    Sexually Abused: No   Family Status  Relation Name Status   Mother  Alive       cancer survivor   Father  Deceased   Sister  Alive   Sister  Alive   MGM  Alive   MGF  (Not Specified)   Other uncle Alive   Neg Hx  (Not Specified)  No partnership data on file   Family History  Problem Relation Age of Onset   Cancer Mother 57       Breast   Kidney Stones Mother    Diabetes Father  Hypertension Father    CVA Father    Breast cancer Maternal Grandmother    Lymphoma Maternal Grandfather    Kidney Stones Other    Colon cancer Neg Hx    Prostate cancer Neg Hx    No Known Allergies  Patient Care Team: Lamon Pillow, MD as PCP - General (Family Medicine) Cherylene Corrente, Kizzie Perks, MD (Urology) Dionicia Frater, MD as Referring Physician (Internal Medicine) Hardin Leys, MD as Referring Physician (Vascular Surgery) Bridgette Campus (Optometry)   Medications: Outpatient Medications Prior to Visit  Medication Sig   aspirin 81 MG tablet Take 81 mg by mouth daily.   atorvastatin  (LIPITOR) 40 MG tablet TAKE 1 TABLET (40 MG TOTAL) BY MOUTH DAILY. PLEASE SCHEDULE AN OFFICE VISIT BEFORE ANYMORE REFILLS.   B Complex-C (B-COMPLEX WITH VITAMIN C) tablet Take 1 tablet by mouth daily.   Blood Glucose Monitoring Suppl (ONE TOUCH ULTRA 2) w/Device KIT Use to check sugar daily for type 2 diabetes E11.9   cetirizine (ZYRTEC) 10 MG tablet Take 10 mg by mouth daily.   cyclobenzaprine  (FLEXERIL ) 10 MG tablet Take 1 tablet by mouth 2 (two) times daily as needed.   escitalopram  (LEXAPRO ) 10 MG tablet TAKE 1 TABLET BY MOUTH EVERY DAY   fluticasone (FLONASE) 50 MCG/ACT nasal spray Place 1 spray into both nostrils daily.   glucose blood (ONETOUCH ULTRA) test strip Use to check sugar daily for type 2 diabetes E11.9   ibuprofen  (ADVIL ,MOTRIN ) 800 MG tablet Take 1 tablet (800 mg total) by mouth every 8 (eight) hours as needed.   Lancets (ONETOUCH ULTRASOFT) lancets Use to check  blood sugar daily for type 2 diabetes E11.9   metFORMIN  (GLUCOPHAGE ) 1000 MG tablet TAKE 1 TABLET (1,000 MG TOTAL) BY MOUTH TWICE A DAY WITH FOOD   tirzepatide  (MOUNJARO ) 5 MG/0.5ML Pen Inject 5 mg into the skin once a week.   triamcinolone  ointment (KENALOG ) 0.5 % APPLY TO AFFECTED AREA TWICE A DAY   valsartan -hydrochlorothiazide  (DIOVAN -HCT) 80-12.5 MG tablet TAKE 1 TABLET BY MOUTH EVERY DAY   No facility-administered medications prior to visit.    Review of Systems  Constitutional:  Negative for appetite change, chills and fever.  Respiratory:  Negative for chest tightness, shortness of breath and wheezing.   Cardiovascular:  Negative for chest pain and palpitations.  Gastrointestinal:  Negative for abdominal pain, nausea and vomiting.      Objective    BP 128/79 (BP Location: Left Arm, Patient Position: Sitting, Cuff Size: Large)   Pulse 91   Resp 16   Ht 5\' 10"  (1.778 m)   Wt 239 lb 1.6 oz (108.5 kg)   SpO2 99%   BMI 34.31 kg/m    Physical Exam  General Appearance:    Mildly obese male. Alert, cooperative, in no acute distress, appears stated age  Head:    Normocephalic, without obvious abnormality, atraumatic  Eyes:    PERRL, conjunctiva/corneas clear, EOM's intact, fundi    benign, both eyes       Ears:    Normal TM's and external ear canals, both ears  Nose:   Nares normal, septum midline, mucosa normal, no drainage   or sinus tenderness  Throat:   Lips, mucosa, and tongue normal; teeth and gums normal  Neck:   Supple, symmetrical, trachea midline, no adenopathy;       thyroid :  No enlargement/tenderness/nodules; no carotid   bruit or JVD  Back:     Symmetric, no curvature, ROM normal, no CVA tenderness  Lungs:     Clear to auscultation bilaterally, respirations unlabored  Chest wall:    No tenderness or deformity  Heart:    Normal heart rate. Normal rhythm. No murmurs, rubs, or gallops.  S1 and S2 normal  Abdomen:     Soft, non-tender, bowel sounds active all  four quadrants,    no masses, no organomegaly  Genitalia:    deferred  Rectal:    deferred  Extremities:   All extremities are intact. No cyanosis or edema  Pulses:   2+ and symmetric all extremities  Skin:   Skin color, texture, turgor normal, no rashes or lesions  Lymph nodes:   Cervical, supraclavicular, and axillary nodes normal  Neurologic:   CNII-XII intact. Normal strength, sensation and reflexes      throughout       Last depression screening scores    01/29/2024    8:20 AM 09/29/2023    9:46 AM 06/16/2023    8:26 AM  PHQ 2/9 Scores  PHQ - 2 Score 2 1 1   PHQ- 9 Score 5 5 3    Last fall risk screening    01/29/2024    8:20 AM  Fall Risk   Falls in the past year? 1  Number falls in past yr: 1  Injury with Fall? 0  Risk for fall due to : No Fall Risks   Last Audit-C alcohol use screening    01/29/2024    8:23 AM  Alcohol Use Disorder Test (AUDIT)  1. How often do you have a drink containing alcohol? 2  2. How many drinks containing alcohol do you have on a typical day when you are drinking? 0  3. How often do you have six or more drinks on one occasion? 0  AUDIT-C Score 2   A score of 3 or more in women, and 4 or more in men indicates increased risk for alcohol abuse, EXCEPT if all of the points are from question 1   No results found for any visits on 01/29/24.  Assessment & Plan    Routine Health Maintenance and Physical Exam  Exercise Activities and Dietary recommendations  Goals   None     Immunization History  Administered Date(s) Administered   Influenza, Seasonal, Injecte, Preservative Fre 06/16/2023   Influenza,inj,Quad PF,6+ Mos 09/08/2015, 07/12/2019, 07/30/2021   Influenza-Unspecified 07/18/2017, 07/17/2018, 06/17/2020   PFIZER(Purple Top)SARS-COV-2 Vaccination 11/27/2019, 12/24/2019, 07/10/2020   Tdap 12/06/2010, 07/30/2021    Health Maintenance  Topic Date Due   HIV Screening  Never done   Pneumococcal Vaccine 27-41 Years old (1 of 2 - PCV)  Never done   Zoster Vaccines- Shingrix (1 of 2) Never done   Diabetic kidney evaluation - Urine ACR  03/01/2023   COVID-19 Vaccine (4 - 2024-25 season) 05/28/2023   Diabetic kidney evaluation - eGFR measurement  02/15/2024   HEMOGLOBIN A1C  03/28/2024   INFLUENZA VACCINE  04/26/2024   OPHTHALMOLOGY EXAM  10/31/2024   Colonoscopy  12/22/2030   DTaP/Tdap/Td (3 - Td or Tdap) 07/31/2031   Hepatitis C Screening  Completed   HPV VACCINES  Aged Out   Meningococcal B Vaccine  Aged Out    Discussed health benefits of physical activity, and encouraged him to engage in regular exercise appropriate for his age and condition.  Recommended Prevnar and Shingles vaccines he he declined today.   2. Morbid obesity (HCC)   3. Type 2 diabetes mellitus without complication, without long-term current use of insulin (HCC)  - Hemoglobin  A1c - Urine microalbumin-creatinine with uACR  Tolerating Mounjaro  with no adverse effects, but has become less effective. Will titrated up to 7.5mg . He has 12 week supply of the 5mg  dispensed on 4/9.  He can increase frequency to every 4-5 days until gone and will send in prescription for 7.5mg  pens in June.   4. Primary hypertension Well controlled.  Continue current medications.    5. Hypertriglyceridemia He is tolerating atorvastatin  well with no adverse effects.   - Comprehensive metabolic panel with GFR - Lipid panel - CBC - TSH  6. Prostate cancer screening  - PSA Total (Reflex To Free)        Jeralene Mom, MD  [facility] [phone] (phone) [phone] (fax)  [facility]

## 2024-01-29 NOTE — Patient Instructions (Addendum)
 Please review the attached list of medications and notify my office if there are any errors.   You can increase the 5mg  Mounjaro  to every 4-5 days until your current supply is gone. I'll send in a prescription for the 7.5mg  pen in June  The CDC recommends two doses of Shingrix (the shingles vaccine) separated by 2 to 6 months for adults age 51 years and older.  I recommend that you get the Prevnar 20 vaccine to protect yourself from certain dangerous strains of pneumonia. You can get Prevnar 20 at your pharmacy, or call our office at [phone] at your earliest convenience to schedule this vaccine.

## 2024-01-30 ENCOUNTER — Encounter: Payer: Self-pay | Admitting: Family Medicine

## 2024-01-30 LAB — COMPREHENSIVE METABOLIC PANEL WITH GFR
ALT: 29 IU/L (ref 0–44)
AST: 22 IU/L (ref 0–40)
Albumin: 4.8 g/dL (ref 4.1–5.1)
Alkaline Phosphatase: 71 IU/L (ref 44–121)
BUN/Creatinine Ratio: 21 — ABNORMAL HIGH (ref 9–20)
BUN: 19 mg/dL (ref 6–24)
Bilirubin Total: 0.5 mg/dL (ref 0.0–1.2)
CO2: 22 mmol/L (ref 20–29)
Calcium: 10.4 mg/dL — ABNORMAL HIGH (ref 8.7–10.2)
Chloride: 103 mmol/L (ref 96–106)
Creatinine, Ser: 0.91 mg/dL (ref 0.76–1.27)
Globulin, Total: 2.3 g/dL (ref 1.5–4.5)
Glucose: 142 mg/dL — ABNORMAL HIGH (ref 70–99)
Potassium: 4.5 mmol/L (ref 3.5–5.2)
Sodium: 142 mmol/L (ref 134–144)
Total Protein: 7.1 g/dL (ref 6.0–8.5)
eGFR: 103 mL/min/{1.73_m2} (ref >59)

## 2024-01-30 LAB — LIPID PANEL
Chol/HDL Ratio: 3.4 ratio (ref 0.0–5.0)
Cholesterol, Total: 92 mg/dL — ABNORMAL LOW (ref 100–199)
HDL: 27 mg/dL — ABNORMAL LOW (ref >39)
LDL Chol Calc (NIH): 36 mg/dL (ref 0–99)
Triglycerides: 173 mg/dL — ABNORMAL HIGH (ref 0–149)
VLDL Cholesterol Cal: 29 mg/dL (ref 5–40)

## 2024-01-30 LAB — CBC
Hematocrit: 45.2 % (ref 37.5–51.0)
Hemoglobin: 15.5 g/dL (ref 13.0–17.7)
MCH: 32 pg (ref 26.6–33.0)
MCHC: 34.3 g/dL (ref 31.5–35.7)
MCV: 93 fL (ref 79–97)
Platelets: 280 10*3/uL (ref 150–450)
RBC: 4.84 x10E6/uL (ref 4.14–5.80)
RDW: 11.9 % (ref 11.6–15.4)
WBC: 9.8 10*3/uL (ref 3.4–10.8)

## 2024-01-30 LAB — MICROALBUMIN / CREATININE URINE RATIO
Creatinine, Urine: 140 mg/dL
Microalb/Creat Ratio: 8 mg/g{creat} (ref 0–29)
Microalbumin, Urine: 10.5 ug/mL

## 2024-01-30 LAB — PSA TOTAL (REFLEX TO FREE): Prostate Specific Ag, Serum: 0.3 ng/mL (ref 0.0–4.0)

## 2024-01-30 LAB — HEMOGLOBIN A1C
Est. average glucose Bld gHb Est-mCnc: 126 mg/dL
Hgb A1c MFr Bld: 6 % — ABNORMAL HIGH (ref 4.8–5.6)

## 2024-01-30 LAB — TSH: TSH: 2.84 u[IU]/mL (ref 0.450–4.500)

## 2024-03-18 ENCOUNTER — Telehealth: Payer: Self-pay

## 2024-03-18 DIAGNOSIS — E119 Type 2 diabetes mellitus without complications: Secondary | ICD-10-CM

## 2024-03-18 MED ORDER — TIRZEPATIDE 7.5 MG/0.5ML ~~LOC~~ SOAJ: 7.5000 mg | SUBCUTANEOUS | 0 refills | Status: DC

## 2024-03-18 NOTE — Telephone Encounter (Signed)
 Copied from CRM [id number]. Topic: Clinical - Medication Question >> Mar 18, 2024 10:16 AM Gustabo D wrote: Patient is taking - tirzepatide  (MOUNJARO ) 5 MG/0.5ML Pen Says he spoke with pcp about taking the 7.5 but new prescription has 5 mg

## 2024-03-18 NOTE — Telephone Encounter (Signed)
 New prescription for 7.5 mg sent in.

## 2024-03-27 ENCOUNTER — Other Ambulatory Visit: Payer: Self-pay | Admitting: Family Medicine

## 2024-03-27 DIAGNOSIS — F419 Anxiety disorder, unspecified: Secondary | ICD-10-CM

## 2024-03-27 DIAGNOSIS — I10 Essential (primary) hypertension: Secondary | ICD-10-CM

## 2024-04-16 ENCOUNTER — Other Ambulatory Visit: Payer: Self-pay | Admitting: Family Medicine

## 2024-04-16 DIAGNOSIS — E781 Pure hyperglyceridemia: Secondary | ICD-10-CM

## 2024-05-11 ENCOUNTER — Ambulatory Visit
Admission: EM | Admit: 2024-05-11 | Discharge: 2024-05-11 | Disposition: A | Discharge status: Home / Self Care | Attending: Emergency Medicine | Admitting: Emergency Medicine

## 2024-05-11 ENCOUNTER — Encounter: Payer: Self-pay | Admitting: Emergency Medicine

## 2024-05-11 DIAGNOSIS — L03115 Cellulitis of right lower limb: Secondary | ICD-10-CM

## 2024-05-11 MED ORDER — DOXYCYCLINE HYCLATE 100 MG PO CAPS: 100.0000 mg | ORAL_CAPSULE | Freq: Two times a day (BID) | ORAL | 0 refills | Status: DC

## 2024-05-11 NOTE — ED Triage Notes (Signed)
 Patient complains of pain, swelling and redness in right leg that started to day. Rates pain 4/10. Patient also complains of chills. Patient is worried about Cellulitis. Patient took Ibuprofen  400 mg at 11:10 am.

## 2024-05-11 NOTE — Discharge Instructions (Addendum)
 Today you are evaluated for the pain and swelling to your leg which is concerning for infection  Take doxycycline  twice daily for 10 days for bacterial treatment  You may take Tylenol  and/or Motrin  as needed for management of fever  Cleanse over the affected area daily with unscented soap and water, pat and do not rub, if concerning for friction or the area becoming dirty please cover with a nonstick Band-Aid  If you have not seen any improvement within 3 days or symptoms are worsening please follow-up for reevaluation

## 2024-05-11 NOTE — ED Provider Notes (Signed)
 Elijah Mora    CSN: [id number] Arrival date & time: 05/11/24  1159      History   Chief Complaint Chief Complaint  Patient presents with   Leg Swelling   Leg Pain    HPI Elijah Mora is a 53 y.o. male.   Patient presents for evaluation of erythema, pain and swelling to the right beginning today, associated fever and chills.  Has taken ibuprofen  around 10 AM.  Has open wounds to the back of the ankle that occurred 3 days ago after the foot rubbed against the shoe.  History of reoccurring infection to the skin.  Denies drainage.   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   Sleep apnea    CPAP    Patient Active Problem List   Diagnosis Date Noted   History of adenomatous polyp of colon 01/29/2024   Adenomatous polyp of colon 12/22/2023   OSA on CPAP 02/15/2023   Hypertriglyceridemia 02/15/2023   Venous stasis dermatitis of right lower extremity 06/20/2022   Hypogonadism in male 08/13/2021   Type 2 diabetes mellitus without complication, without long-term current use of insulin (HCC) 05/29/2020   Primary hypertension 05/29/2020   Morbid obesity (HCC) 05/29/2020   Lumbar radiculopathy 12/06/2018   Localized edema 02/20/2018   Femoral-popliteal bypass graft occlusion, 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   Acute anxiety 09/08/2015   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   Dizziness 05/08/2015   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/04/1997    Past Surgical History:  Procedure Laterality Date   COLONOSCOPY WITH PROPOFOL  N/A 12/22/2023   Procedure: COLONOSCOPY WITH PROPOFOL ;  Surgeon: Therisa Bi, MD;  Location: [facility] ENDOSCOPY;  Service: Gastroenterology;  Laterality: N/A;  DM, REQUEST 1ST CASE   CYSTOSCOPY WITH STENT  PLACEMENT Right 02/10/2017   Procedure: CYSTOSCOPY WITH STENT PLACEMENT;  Surgeon: Chauncey Redell Agent, MD;  Location: [facility] ORS;  Service: Urology;  Laterality: Right;   EXTRACORPOREAL SHOCK WAVE LITHOTRIPSY Right 02/02/2017   Procedure: EXTRACORPOREAL SHOCK WAVE LITHOTRIPSY (ESWL);  Surgeon: Penne Knee, MD;  Location: [facility] ORS;  Service: Urology;  Laterality: Right;   FEMORAL BYPASS Right 04/18/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 Dr. Wilder Anchors   KIDNEY STONE SURGERY     POLYPECTOMY  12/22/2023   Procedure: POLYPECTOMY;  Surgeon: Therisa Bi, MD;  Location: [facility] ENDOSCOPY;  Service: Gastroenterology;;   Synovial cancer     behind right knee. Off Chemo 06/1999   URETEROSCOPY WITH HOLMIUM LASER LITHOTRIPSY Right 02/10/2017   Procedure: URETEROSCOPY WITH HOLMIUM LASER LITHOTRIPSY;  Surgeon: Chauncey Redell Agent, MD;  Location: [facility] ORS;  Service: Urology;  Laterality: Right;       Home Medications    Prior to Admission medications   Medication Sig Start Date End Date Taking? Authorizing Provider  doxycycline  (VIBRAMYCIN ) 100 MG capsule Take 1 capsule (100 mg total) by mouth 2 (two) times daily. 05/11/24  Yes Teresa Price R, NP  aspirin 81 MG tablet Take 81 mg by mouth daily.    [provider]  atorvastatin  (LIPITOR) 40 MG tablet Take 1 tablet (40 mg total) by mouth daily. 04/16/24   Gasper Nancyann BRAVO, MD  B Complex-C (B-COMPLEX WITH VITAMIN C) tablet Take 1  tablet by mouth daily.    [provider]  Blood Glucose Monitoring Suppl (ONE TOUCH ULTRA 2) w/Device KIT Use to check sugar daily for type 2 diabetes E11.9 02/07/20   Gasper Nancyann BRAVO, MD  cetirizine (ZYRTEC) 10 MG tablet Take 10 mg by mouth daily.    [provider]  cyclobenzaprine  (FLEXERIL ) 10 MG tablet Take 1 tablet by mouth 2 (two) times daily as needed. 06/08/19   [provider]  escitalopram  (LEXAPRO ) 10 MG tablet TAKE 1 TABLET BY MOUTH EVERY DAY 03/27/24   Gasper Nancyann BRAVO,  MD  fluticasone (FLONASE) 50 MCG/ACT nasal spray Place 1 spray into both nostrils daily.    [provider]  glucose blood (ONETOUCH ULTRA) test strip Use to check sugar daily for type 2 diabetes E11.9 02/15/23   Gasper Nancyann BRAVO, MD  ibuprofen  (ADVIL ,MOTRIN ) 800 MG tablet Take 1 tablet (800 mg total) by mouth every 8 (eight) hours as needed. 12/31/15   Vivienne Delon HERO, PA-C  Lancets [facility] ULTRASOFT) lancets Use to check blood sugar daily for type 2 diabetes E11.9 02/07/20   Gasper Nancyann BRAVO, MD  metFORMIN  (GLUCOPHAGE ) 1000 MG tablet TAKE 1 TABLET (1,000 MG TOTAL) BY MOUTH TWICE A DAY WITH FOOD 12/31/23   Gasper Nancyann BRAVO, MD  tirzepatide  (MOUNJARO ) 5 MG/0.5ML Pen Inject 5 mg into the skin once a week. 09/29/23   Gasper Nancyann BRAVO, MD  tirzepatide  (MOUNJARO ) 7.5 MG/0.5ML Pen Inject 7.5 mg into the skin once a week. 03/18/24   Gasper Nancyann BRAVO, MD  triamcinolone  ointment (KENALOG ) 0.5 % APPLY TO AFFECTED AREA TWICE A DAY 10/06/23   Gasper Nancyann BRAVO, MD  valsartan -hydrochlorothiazide  (DIOVAN -HCT) 80-12.5 MG tablet TAKE 1 TABLET BY MOUTH EVERY DAY 03/27/24   Gasper Nancyann BRAVO, MD    Family History Family History  Problem Relation Age of Onset   Cancer Mother 45       Breast   Kidney Stones Mother    Diabetes Father    Hypertension Father    CVA Father    Breast cancer Maternal Grandmother    Lymphoma Maternal Grandfather    Kidney Stones Other    Colon cancer Neg Hx    Prostate cancer Neg Hx     Social History Social History   Tobacco Use   Smoking status: Never   Smokeless tobacco: Never  Vaping Use   Vaping status: Never Used  Substance Use Topics   Alcohol use: Yes    Alcohol/week: 4.0 standard drinks of alcohol    Types: 4 Cans of beer per week    Comment: occasional use   Drug use: No     Allergies   Patient has no known allergies.   Review of Systems Review of Systems   Physical Exam Triage Vital Signs ED Triage Vitals  Encounter Vitals Group     BP  05/11/24 1226 139/84     Girls Systolic BP Percentile --      Girls Diastolic BP Percentile --      Boys Systolic BP Percentile --      Boys Diastolic BP Percentile --      Pulse Rate 05/11/24 1226 (!) 128     Resp 05/11/24 1226 20     Temp 05/11/24 1226 (!) 100.4 F (38 C)     Temp Source 05/11/24 1226 Oral     SpO2 05/11/24 1226 99 %     Weight --      Height --  Head Circumference --      Peak Flow --      Pain Score 05/11/24 1223 4     Pain Loc --      Pain Education --      Exclude from Growth Chart --    No data found.  Updated Vital Signs BP 139/84 (BP Location: Left Arm)   Pulse (!) 128   Temp (!) 100.4 F (38 C) (Oral)   Resp 20   SpO2 99%   Visual Acuity Right Eye Distance:   Left Eye Distance:   Bilateral Distance:    Right Eye Near:   Left Eye Near:    Bilateral Near:     Physical Exam Constitutional:      Appearance: Normal appearance.  Eyes:     Extraocular Movements: Extraocular movements intact.  Pulmonary:     Effort: Pulmonary effort is normal.  Skin:    Comments: 1 x 2 wound present to the posterior right ankle, surrounding erythema, generalized swelling to the right ankle and to the right lower extremity, skin erythematous and warm to touch, no drainage noted  Neurological:     Mental Status: He is alert and oriented to person, place, and time. Mental status is at baseline.      UC Treatments / Results  Labs (all labs ordered are listed, but only abnormal results are displayed) Labs Reviewed - No data to display  EKG   Radiology No results found.  Procedures Procedures (including critical care time)  Medications Ordered in UC Medications - No data to display  Initial Impression / Assessment and Plan / UC Course  I have reviewed the triage vital signs and the nursing notes.  Pertinent labs & imaging results that were available during my care of the patient were reviewed by me and considered in my medical decision making  (see chart for details).  Cellulitis of the right leg  Fever peaking at 100.4 with associated tachycardia, patient in no signs of distress nontoxic-appearing, stable for outpatient management, no further signs of infection, presentation concerning for infection, prescribed doxycycline , extended course from 7 to 10 days due to history of diabetes, recommended supportive measures and advised follow-up if no improvement within 72 hours Final Clinical Impressions(s) / UC Diagnoses   Final diagnoses:  Cellulitis of leg, right     Discharge Instructions      Today you are evaluated for the pain and swelling to your leg which is concerning for infection  Take doxycycline  twice daily for 10 days for bacterial treatment  You may take Tylenol  and/or Motrin  as needed for management of fever  Cleanse over the affected area daily with unscented soap and water, pat and do not rub, if concerning for friction or the area becoming dirty please cover with a nonstick Band-Aid  If you have not seen any improvement within 3 days or symptoms are worsening please follow-up for reevaluation   ED Prescriptions     Medication Sig Dispense Auth. Provider   doxycycline  (VIBRAMYCIN ) 100 MG capsule Take 1 capsule (100 mg total) by mouth 2 (two) times daily. 20 capsule Sada Mazzoni R, NP      PDMP not reviewed this encounter.   Teresa Shelba SAUNDERS, NP 05/11/24 1249

## 2024-05-31 ENCOUNTER — Encounter: Payer: Self-pay | Admitting: Family Medicine

## 2024-05-31 ENCOUNTER — Ambulatory Visit: Admitting: Family Medicine

## 2024-05-31 VITALS — BP 127/79 | HR 84 | Temp 98.7°F | Ht 70.0 in | Wt 238.8 lb

## 2024-05-31 DIAGNOSIS — I10 Essential (primary) hypertension: Secondary | ICD-10-CM | POA: Diagnosis not present

## 2024-05-31 DIAGNOSIS — Z23 Encounter for immunization: Secondary | ICD-10-CM

## 2024-05-31 DIAGNOSIS — E119 Type 2 diabetes mellitus without complications: Secondary | ICD-10-CM | POA: Diagnosis not present

## 2024-05-31 DIAGNOSIS — Z7985 Long-term (current) use of injectable non-insulin antidiabetic drugs: Secondary | ICD-10-CM

## 2024-05-31 NOTE — Addendum Note (Signed)
 Addended by: TERREL POWELL CROME on: 05/31/2024 04:56 PM   Modules accepted: Orders

## 2024-05-31 NOTE — Progress Notes (Signed)
 Established patient visit   Patient: Elijah Mora   DOB: Apr 27, 1971   53 y.o. Male  MRN: [id number] Visit Date: 05/31/2024  Today's healthcare provider: Nancyann Perry, MD   Chief Complaint  Patient presents with   Diabetes    Elijah Mora is a 53 y.o. male who presents for follow up of diabetes.  Patient reports that he does not have any symptoms and have not been checking his glucose within the last month.  States when he was checking it ranged from 111- 160.  Declined Pneumococcal Vaccine and Shingles Flu- yes   Subjective    Discussed the use of AI scribe software for clinical note transcription with the patient, who gave verbal consent to proceed.  History of Present Illness   Elijah Mora is a 53 year old male with type 2 diabetes, hyperlipidemia, and hypertension who presents for a diabetes follow-up.  He is managing his type 2 diabetes with Mounjaro  7.5mg /week and metformin  1000 BID. He has noticed a change in bowel habits with reduced frequency of bowel movements but no constipation or diarrhea. Occasionally, he experiences nausea, which he attributes to irregular eating patterns. Prior to a recent vacation, his blood sugar levels ranged from 111 to 164 mg/dL. He acknowledges a lapse in diabetes management during the vacation but is now refocusing on his regimen.  For hypertension, he is on valsartan  HCTZ and reports stable blood pressure readings, typically around 120/80 mmHg. He monitors his blood pressure regularly, although there was a lapse in monitoring during the holiday period.  He has not noticed significant weight loss with Mounjaro , attributing any weight changes to intermittent fasting and increased physical activity. He has two weeks of Mounjaro  left     Lab Results  Component Value Date   HGBA1C 6.0 (H) 01/29/2024   Lab Results  Component Value Date   NA 142 01/29/2024   K 4.5 01/29/2024   CREATININE 0.91 01/29/2024   EGFR  103 01/29/2024   GLUCOSE 142 (H) 01/29/2024   Lab Results  Component Value Date   CHOL 92 (L) 01/29/2024   HDL 27 (L) 01/29/2024   LDLCALC 36 01/29/2024   TRIG 173 (H) 01/29/2024   CHOLHDL 3.4 01/29/2024   Wt Readings from Last 3 Encounters:  05/31/24 238 lb 12.8 oz (108.3 kg)  01/29/24 239 lb 1.6 oz (108.5 kg)  12/22/23 234 lb 9.6 oz (106.4 kg)     Medications: Outpatient Medications Prior to Visit  Medication Sig Note   aspirin 81 MG tablet Take 81 mg by mouth daily.    atorvastatin  (LIPITOR) 40 MG tablet Take 1 tablet (40 mg total) by mouth daily.    B Complex-C (B-COMPLEX WITH VITAMIN C) tablet Take 1 tablet by mouth daily.    Blood Glucose Monitoring Suppl (ONE TOUCH ULTRA 2) w/Device KIT Use to check sugar daily for type 2 diabetes E11.9    cetirizine (ZYRTEC) 10 MG tablet Take 10 mg by mouth daily.    cyclobenzaprine  (FLEXERIL ) 10 MG tablet Take 1 tablet by mouth 2 (two) times daily as needed.    escitalopram  (LEXAPRO ) 10 MG tablet TAKE 1 TABLET BY MOUTH EVERY DAY    fluticasone (FLONASE) 50 MCG/ACT nasal spray Place 1 spray into both nostrils daily.    glucose blood (ONETOUCH ULTRA) test strip Use to check sugar daily for type 2 diabetes E11.9    ibuprofen  (ADVIL ,MOTRIN ) 800 MG tablet Take 1 tablet (800 mg total) by mouth  every 8 (eight) hours as needed.    Lancets (ONETOUCH ULTRASOFT) lancets Use to check blood sugar daily for type 2 diabetes E11.9    metFORMIN  (GLUCOPHAGE ) 1000 MG tablet TAKE 1 TABLET (1,000 MG TOTAL) BY MOUTH TWICE A DAY WITH FOOD    tirzepatide  (MOUNJARO ) 7.5 MG/0.5ML Pen Inject 7.5 mg into the skin once a week.    triamcinolone  ointment (KENALOG ) 0.5 % APPLY TO AFFECTED AREA TWICE A DAY    valsartan -hydrochlorothiazide  (DIOVAN -HCT) 80-12.5 MG tablet TAKE 1 TABLET BY MOUTH EVERY DAY    No facility-administered medications prior to visit.   Review of Systems  Constitutional:  Negative for appetite change, chills and fever.  Respiratory:  Negative  for chest tightness, shortness of breath and wheezing.   Cardiovascular:  Negative for chest pain and palpitations.  Gastrointestinal:  Negative for abdominal pain, nausea and vomiting.       Objective    BP 127/79 (BP Location: Left Arm, Patient Position: Sitting, Cuff Size: Normal)   Pulse 84   Temp 98.7 F (37.1 C) (Oral)   Ht 5' 10 (1.778 m)   Wt 238 lb 12.8 oz (108.3 kg)   SpO2 100%   BMI 34.26 kg/m   Physical Exam   General: Appearance:    Mildly obese male in no acute distress  Eyes:    PERRL, conjunctiva/corneas clear, EOM's intact       Lungs:     Clear to auscultation bilaterally, respirations unlabored  Heart:    Normal heart rate. Normal rhythm. No murmurs, rubs, or gallops.    MS:   All extremities are intact.    Neurologic:   Awake, alert, oriented x 3. No apparent focal neurological defect.          Assessment & Plan    1. Type 2 diabetes mellitus without complication, without long-term current use of insulin (HCC) (Primary) Tolerating Mounjaro  well and seems to be effective controlling his blood sugar, but has not seen any significant weight loss since starting it. Consider increasing mounjaro  to 10 and reducing metformin  to 500 BID if A1c is at goal.  - Hemoglobin A1c  2. Primary hypertension Well controlled.  Continue current medications.    3. Need for influenza vaccination  - Flu vaccine trivalent PF, 6mos and older(Flulaval,Afluria,Fluarix,Fluzone)  Return in about 4 months (around 09/30/2024) for Diabetes, Hypertension.      Nancyann Perry, MD  [facility] [phone] (phone) [phone] (fax)  [facility]

## 2024-05-31 NOTE — Patient Instructions (Signed)
 Please review the attached list of medications and notify my office if there are any errors.   I recommend that you get the Prevnar 20 vaccine to protect yourself from certain dangerous strains of pneumonia. You can get Prevnar 20 at your pharmacy, or call our office at [phone] at your earliest convenience to schedule this vaccine.

## 2024-06-01 LAB — HEMOGLOBIN A1C
Est. average glucose Bld gHb Est-mCnc: 123 mg/dL
Hgb A1c MFr Bld: 5.9 % — ABNORMAL HIGH (ref 4.8–5.6)

## 2024-06-02 ENCOUNTER — Ambulatory Visit: Payer: Self-pay | Admitting: Family Medicine

## 2024-06-02 DIAGNOSIS — E119 Type 2 diabetes mellitus without complications: Secondary | ICD-10-CM

## 2024-06-02 MED ORDER — TIRZEPATIDE 10 MG/0.5ML ~~LOC~~ SOAJ: 10.0000 mg | SUBCUTANEOUS | 1 refills | Status: AC

## 2024-06-02 MED ORDER — METFORMIN HCL 500 MG PO TABS: 500.0000 mg | ORAL_TABLET | Freq: Two times a day (BID) | ORAL | 1 refills | Status: AC

## 2024-07-03 ENCOUNTER — Other Ambulatory Visit: Payer: Self-pay | Admitting: Family Medicine

## 2024-07-03 DIAGNOSIS — E119 Type 2 diabetes mellitus without complications: Secondary | ICD-10-CM

## 2024-09-13 ENCOUNTER — Other Ambulatory Visit (HOSPITAL_COMMUNITY): Payer: Self-pay

## 2024-09-13 ENCOUNTER — Telehealth: Payer: Self-pay | Admitting: Pharmacy Technician

## 2024-09-13 NOTE — Telephone Encounter (Signed)
 Pharmacy Patient Advocate Encounter   Received notification from Onbase that prior authorization for Mounjaro  10MG /0.5ML auto-injectors is required/requested.   Insurance verification completed.   The patient is insured through [employer].   Per test claim: PA required; PA submitted to above mentioned insurance via Latent Key/confirmation #/[id number] Status is pending

## 2024-09-13 NOTE — Telephone Encounter (Signed)
 Pharmacy Patient Advocate Encounter  Received notification from [employer] that Prior Authorization for Mounjaro  10MG /0.5ML auto-injectors has been APPROVED from 08/14/24 to 09/13/25. Ran test claim, Copay is $25.00. This test claim was processed through [facility]- copay amounts may vary at other pharmacies due to pharmacy/plan contracts, or as the patient moves through the different stages of their insurance plan.   PA #/Case ID/Reference #: [id number]

## 2024-10-02 ENCOUNTER — Ambulatory Visit: Admitting: Family Medicine

## 2024-10-14 ENCOUNTER — Ambulatory Visit: Admitting: Family Medicine

## 2024-10-14 ENCOUNTER — Encounter: Payer: Self-pay | Admitting: Family Medicine

## 2024-10-14 VITALS — BP 106/63 | HR 95 | Resp 16 | Ht 70.0 in | Wt 230.0 lb

## 2024-10-14 DIAGNOSIS — I10 Essential (primary) hypertension: Secondary | ICD-10-CM

## 2024-10-14 DIAGNOSIS — E66811 Obesity, class 1: Secondary | ICD-10-CM

## 2024-10-14 DIAGNOSIS — Z7985 Long-term (current) use of injectable non-insulin antidiabetic drugs: Secondary | ICD-10-CM | POA: Diagnosis not present

## 2024-10-14 DIAGNOSIS — E119 Type 2 diabetes mellitus without complications: Secondary | ICD-10-CM | POA: Diagnosis not present

## 2024-10-14 DIAGNOSIS — E781 Pure hyperglyceridemia: Secondary | ICD-10-CM

## 2024-10-14 LAB — POCT GLYCOSYLATED HEMOGLOBIN (HGB A1C): Hemoglobin A1C: 5.7 % — AB (ref 4.0–5.6)

## 2024-10-14 NOTE — Progress Notes (Signed)
 "     Established patient visit   Patient: Elijah Mora   DOB: 05/27/1971   54 y.o. Male  MRN: [id number] Visit Date: 10/14/2024  Today's healthcare provider: Nancyann Perry, MD   Chief Complaint  Patient presents with   Follow-up    4 month f/u DM.No other concerns   Diabetes   Subjective    Discussed the use of AI scribe software for clinical note transcription with the patient, who gave verbal consent to proceed.  History of Present Illness   Elijah Mora is a 54 year old male who presents for a routine follow up of hypertension and diabetes.  He has not been checking his blood sugar at home since the holidays due to hesitancy about the results. His A1c is 5.7. He is currently on [employer] for diabetes management and is doing well with it.  He recently had the flu, which lasted four to five days, but he has since recovered.  He checks his blood pressure at home occasionally, with the last reading being 122/83 mmHg. He does not check it regularly but does so when he remembers.  He is currently taking Lexapro  and his overall mood is good. However, he experiences difficulty initiating tasks, which he describes as similar to symptoms of ADHD, despite being aware of what needs to be done.  He had shingles approximately 20 years ago and recalls it as an unpleasant experience.     Lab Results  Component Value Date   HGBA1C 5.9 (H) 05/31/2024   HGBA1C 6.0 (H) 01/29/2024   HGBA1C 5.8 (A) 09/29/2023   Lab Results  Component Value Date   NA 142 01/29/2024   K 4.5 01/29/2024   CREATININE 0.91 01/29/2024   EGFR 103 01/29/2024   GLUCOSE 142 (H) 01/29/2024     Medications: Show/hide medication list[1] Review of Systems     Objective    BP 106/63 (BP Location: Left Arm, Patient Position: Sitting, Cuff Size: Large)   Pulse 95   Resp 16   Ht 5' 10 (1.778 m)   Wt 230 lb (104.3 kg)   SpO2 99%   BMI 33.00 kg/m   Physical Exam   General: Appearance:     Mildly obese male in no acute distress  Eyes:    PERRL, conjunctiva/corneas clear, EOM's intact       Lungs:     Clear to auscultation bilaterally, respirations unlabored  Heart:    Normal heart rate. Normal rhythm. No murmurs, rubs, or gallops.    MS:   All extremities are intact.    Neurologic:   Awake, alert, oriented x 3. No apparent focal neurological defect.         Assessment & Plan    1. Type 2 diabetes mellitus without complication, without long-term current use of insulin (HCC) (Primary) Very well controlled on current medication which he is tolerating well aside from some mild nausea after mounjaro  injections.   2. Primary hypertension Well controlled. Home Bps 120s/80s. Continue current medications.    3. Obesity (BMI 30.0-34.9) Wt down from peak of 275#.        Nancyann Perry, MD  [facility] [phone] (phone) [phone] (fax)  [facility]    [1]  Outpatient Medications Prior to Visit  Medication Sig   aspirin 81 MG tablet Take 81 mg by mouth daily.   atorvastatin  (LIPITOR) 40 MG tablet Take 1 tablet (40 mg total) by mouth daily.  B Complex-C (B-COMPLEX WITH VITAMIN C) tablet Take 1 tablet by mouth daily.   Blood Glucose Monitoring Suppl (ONE TOUCH ULTRA 2) w/Device KIT Use to check sugar daily for type 2 diabetes E11.9   cetirizine (ZYRTEC) 10 MG tablet Take 10 mg by mouth daily.   cyclobenzaprine  (FLEXERIL ) 10 MG tablet Take 1 tablet by mouth 2 (two) times daily as needed.   escitalopram  (LEXAPRO ) 10 MG tablet TAKE 1 TABLET BY MOUTH EVERY DAY   fluticasone (FLONASE) 50 MCG/ACT nasal spray Place 1 spray into both nostrils daily.   glucose blood (ONETOUCH ULTRA) test strip Use to check sugar daily for type 2 diabetes E11.9   ibuprofen  (ADVIL ,MOTRIN ) 800 MG tablet Take 1 tablet (800 mg total) by mouth every 8 (eight) hours as needed.   Lancets (ONETOUCH ULTRASOFT) lancets Use to check blood sugar daily for type 2  diabetes E11.9   metFORMIN  (GLUCOPHAGE ) 500 MG tablet Take 1 tablet (500 mg total) by mouth 2 (two) times daily with a meal.   tirzepatide  (MOUNJARO ) 10 MG/0.5ML Pen Inject 10 mg into the skin once a week.   triamcinolone  ointment (KENALOG ) 0.5 % APPLY TO AFFECTED AREA TWICE A DAY   valsartan -hydrochlorothiazide  (DIOVAN -HCT) 80-12.5 MG tablet TAKE 1 TABLET BY MOUTH EVERY DAY   No facility-administered medications prior to visit.   "

## 2024-10-14 NOTE — Patient Instructions (Signed)
 Please review the attached list of medications and notify my office if there are any errors.   I strongly recommend two doses of Shingrix (the shingles vaccine) separated by 2 to 6 months for adults age 52 years and older. I recommend checking with your insurance plan regarding coverage for this vaccine.   I recommend that you get the Prevnar 20 vaccine to protect yourself from certain dangerous strains of pneumonia. You can get Prevnar 20 at your pharmacy, or call our office at [phone] at your earliest convenience to schedule this vaccine.

## 2025-01-29 ENCOUNTER — Encounter: Admitting: Family Medicine
